# Patient Record
Sex: Male | Born: 1950 | Race: Black or African American | Hispanic: No | Marital: Married | Smoking: Former smoker
Health system: Southern US, Community
[De-identification: ages and names within clinical notes are randomized; demographics above are authoritative.]

## PROBLEM LIST (undated history)

## (undated) DIAGNOSIS — F1011 Alcohol abuse, in remission: Secondary | ICD-10-CM

## (undated) DIAGNOSIS — K219 Gastro-esophageal reflux disease without esophagitis: Secondary | ICD-10-CM

## (undated) DIAGNOSIS — E119 Type 2 diabetes mellitus without complications: Secondary | ICD-10-CM

## (undated) DIAGNOSIS — F1010 Alcohol abuse, uncomplicated: Secondary | ICD-10-CM

## (undated) DIAGNOSIS — E7800 Pure hypercholesterolemia, unspecified: Secondary | ICD-10-CM

## (undated) DIAGNOSIS — I10 Essential (primary) hypertension: Secondary | ICD-10-CM

## (undated) DIAGNOSIS — M549 Dorsalgia, unspecified: Secondary | ICD-10-CM

## (undated) DIAGNOSIS — B182 Chronic viral hepatitis C: Secondary | ICD-10-CM

## (undated) HISTORY — DX: Alcohol abuse, uncomplicated: F10.10

## (undated) HISTORY — DX: Chronic viral hepatitis C: B18.2

## (undated) HISTORY — DX: Gastro-esophageal reflux disease without esophagitis: K21.9

## (undated) HISTORY — DX: Essential (primary) hypertension: I10

## (undated) HISTORY — PX: KNEE SURGERY: SHX244

## (undated) HISTORY — PX: FOOT SURGERY: SHX648

## (undated) HISTORY — PX: BACK SURGERY: SHX140

## (undated) HISTORY — DX: Pure hypercholesterolemia, unspecified: E78.00

## (undated) HISTORY — DX: Alcohol abuse, in remission: F10.11

---

## 1997-09-26 ENCOUNTER — Other Ambulatory Visit: Admission: RE | Admit: 1997-09-26 | Discharge: 1997-09-26 | Payer: Self-pay

## 1997-10-01 ENCOUNTER — Inpatient Hospital Stay (HOSPITAL_COMMUNITY): Admission: EM | Admit: 1997-10-01 | Discharge: 1997-10-02 | Payer: Self-pay | Admitting: Emergency Medicine

## 1997-11-09 ENCOUNTER — Other Ambulatory Visit: Admission: RE | Admit: 1997-11-09 | Discharge: 1997-11-09 | Payer: Self-pay | Admitting: Family Medicine

## 1997-11-22 ENCOUNTER — Other Ambulatory Visit: Admission: RE | Admit: 1997-11-22 | Discharge: 1997-11-22 | Payer: Self-pay | Admitting: Family Medicine

## 1999-05-10 ENCOUNTER — Emergency Department (HOSPITAL_COMMUNITY): Admission: EM | Admit: 1999-05-10 | Discharge: 1999-05-10 | Payer: Self-pay | Admitting: Emergency Medicine

## 1999-10-04 ENCOUNTER — Emergency Department (HOSPITAL_COMMUNITY): Admission: EM | Admit: 1999-10-04 | Discharge: 1999-10-04 | Payer: Self-pay | Admitting: Emergency Medicine

## 1999-12-22 ENCOUNTER — Emergency Department (HOSPITAL_COMMUNITY): Admission: EM | Admit: 1999-12-22 | Discharge: 1999-12-22 | Payer: Self-pay | Admitting: Emergency Medicine

## 2000-12-07 ENCOUNTER — Encounter: Payer: Self-pay | Admitting: Emergency Medicine

## 2000-12-07 ENCOUNTER — Emergency Department (HOSPITAL_COMMUNITY): Admission: EM | Admit: 2000-12-07 | Discharge: 2000-12-07 | Payer: Self-pay | Admitting: Emergency Medicine

## 2001-01-07 ENCOUNTER — Emergency Department (HOSPITAL_COMMUNITY): Admission: EM | Admit: 2001-01-07 | Discharge: 2001-01-07 | Payer: Self-pay | Admitting: Emergency Medicine

## 2001-02-26 ENCOUNTER — Emergency Department (HOSPITAL_COMMUNITY): Admission: EM | Admit: 2001-02-26 | Discharge: 2001-02-26 | Payer: Self-pay | Admitting: *Deleted

## 2001-07-04 ENCOUNTER — Emergency Department (HOSPITAL_COMMUNITY): Admission: EM | Admit: 2001-07-04 | Discharge: 2001-07-04 | Payer: Self-pay | Admitting: Emergency Medicine

## 2001-07-18 ENCOUNTER — Emergency Department (HOSPITAL_COMMUNITY): Admission: EM | Admit: 2001-07-18 | Discharge: 2001-07-18 | Payer: Self-pay

## 2001-07-25 ENCOUNTER — Emergency Department (HOSPITAL_COMMUNITY): Admission: EM | Admit: 2001-07-25 | Discharge: 2001-07-25 | Payer: Self-pay | Admitting: Emergency Medicine

## 2002-03-26 ENCOUNTER — Emergency Department (HOSPITAL_COMMUNITY): Admission: EM | Admit: 2002-03-26 | Discharge: 2002-03-26 | Payer: Self-pay | Admitting: Emergency Medicine

## 2003-06-04 ENCOUNTER — Emergency Department (HOSPITAL_COMMUNITY): Admission: EM | Admit: 2003-06-04 | Discharge: 2003-06-04 | Payer: Self-pay | Admitting: Emergency Medicine

## 2003-07-24 ENCOUNTER — Ambulatory Visit (HOSPITAL_COMMUNITY): Admission: RE | Admit: 2003-07-24 | Discharge: 2003-07-24 | Payer: Self-pay | Admitting: Family Medicine

## 2004-02-14 ENCOUNTER — Emergency Department (HOSPITAL_COMMUNITY): Admission: EM | Admit: 2004-02-14 | Discharge: 2004-02-14 | Payer: Self-pay | Admitting: Emergency Medicine

## 2004-03-25 ENCOUNTER — Ambulatory Visit: Payer: Self-pay | Admitting: Family Medicine

## 2004-06-19 ENCOUNTER — Emergency Department (HOSPITAL_COMMUNITY): Admission: EM | Admit: 2004-06-19 | Discharge: 2004-06-19 | Payer: Self-pay | Admitting: Family Medicine

## 2004-09-09 ENCOUNTER — Ambulatory Visit: Payer: Self-pay | Admitting: Family Medicine

## 2004-09-10 ENCOUNTER — Encounter (INDEPENDENT_AMBULATORY_CARE_PROVIDER_SITE_OTHER): Payer: Self-pay | Admitting: Family Medicine

## 2004-09-10 LAB — CONVERTED CEMR LAB: PSA: 0.18 ng/mL

## 2004-09-15 ENCOUNTER — Ambulatory Visit: Payer: Self-pay | Admitting: Family Medicine

## 2004-09-19 ENCOUNTER — Emergency Department (HOSPITAL_COMMUNITY): Admission: EM | Admit: 2004-09-19 | Discharge: 2004-09-20 | Payer: Self-pay | Admitting: Emergency Medicine

## 2005-03-02 ENCOUNTER — Ambulatory Visit: Payer: Self-pay | Admitting: Family Medicine

## 2005-05-14 ENCOUNTER — Ambulatory Visit: Payer: Self-pay | Admitting: Family Medicine

## 2005-05-15 ENCOUNTER — Ambulatory Visit: Payer: Self-pay | Admitting: Family Medicine

## 2005-05-21 ENCOUNTER — Emergency Department (HOSPITAL_COMMUNITY): Admission: EM | Admit: 2005-05-21 | Discharge: 2005-05-21 | Payer: Self-pay | Admitting: Emergency Medicine

## 2005-06-20 ENCOUNTER — Emergency Department (HOSPITAL_COMMUNITY): Admission: EM | Admit: 2005-06-20 | Discharge: 2005-06-20 | Payer: Self-pay | Admitting: Emergency Medicine

## 2005-07-10 ENCOUNTER — Ambulatory Visit: Payer: Self-pay | Admitting: Family Medicine

## 2005-08-25 ENCOUNTER — Emergency Department (HOSPITAL_COMMUNITY): Admission: EM | Admit: 2005-08-25 | Discharge: 2005-08-25 | Payer: Self-pay | Admitting: Family Medicine

## 2006-02-06 ENCOUNTER — Emergency Department (HOSPITAL_COMMUNITY): Admission: EM | Admit: 2006-02-06 | Discharge: 2006-02-06 | Payer: Self-pay | Admitting: Family Medicine

## 2006-02-09 ENCOUNTER — Ambulatory Visit: Payer: Self-pay | Admitting: Family Medicine

## 2006-03-03 ENCOUNTER — Ambulatory Visit: Payer: Self-pay | Admitting: Internal Medicine

## 2006-03-15 ENCOUNTER — Emergency Department (HOSPITAL_COMMUNITY): Admission: EM | Admit: 2006-03-15 | Discharge: 2006-03-15 | Payer: Self-pay | Admitting: Family Medicine

## 2006-04-12 ENCOUNTER — Ambulatory Visit: Payer: Self-pay | Admitting: Family Medicine

## 2006-06-18 ENCOUNTER — Ambulatory Visit: Payer: Self-pay | Admitting: Family Medicine

## 2006-08-18 ENCOUNTER — Emergency Department (HOSPITAL_COMMUNITY): Admission: EM | Admit: 2006-08-18 | Discharge: 2006-08-18 | Payer: Self-pay | Admitting: Family Medicine

## 2006-09-20 ENCOUNTER — Ambulatory Visit: Payer: Self-pay | Admitting: Family Medicine

## 2006-10-04 ENCOUNTER — Emergency Department (HOSPITAL_COMMUNITY): Admission: EM | Admit: 2006-10-04 | Discharge: 2006-10-04 | Payer: Self-pay | Admitting: Family Medicine

## 2006-12-21 ENCOUNTER — Encounter (INDEPENDENT_AMBULATORY_CARE_PROVIDER_SITE_OTHER): Payer: Self-pay | Admitting: Family Medicine

## 2006-12-21 DIAGNOSIS — I10 Essential (primary) hypertension: Secondary | ICD-10-CM | POA: Insufficient documentation

## 2006-12-21 DIAGNOSIS — K219 Gastro-esophageal reflux disease without esophagitis: Secondary | ICD-10-CM | POA: Insufficient documentation

## 2006-12-21 HISTORY — DX: Essential (primary) hypertension: I10

## 2006-12-21 HISTORY — DX: Gastro-esophageal reflux disease without esophagitis: K21.9

## 2006-12-24 DIAGNOSIS — F1011 Alcohol abuse, in remission: Secondary | ICD-10-CM | POA: Insufficient documentation

## 2006-12-24 DIAGNOSIS — B182 Chronic viral hepatitis C: Secondary | ICD-10-CM | POA: Insufficient documentation

## 2006-12-24 HISTORY — DX: Chronic viral hepatitis C: B18.2

## 2007-01-24 ENCOUNTER — Ambulatory Visit: Payer: Self-pay | Admitting: Family Medicine

## 2007-02-02 ENCOUNTER — Ambulatory Visit (HOSPITAL_COMMUNITY): Admission: RE | Admit: 2007-02-02 | Discharge: 2007-02-02 | Payer: Self-pay | Admitting: Family Medicine

## 2007-02-28 ENCOUNTER — Ambulatory Visit: Payer: Self-pay | Admitting: Family Medicine

## 2007-06-05 ENCOUNTER — Emergency Department (HOSPITAL_COMMUNITY): Admission: EM | Admit: 2007-06-05 | Discharge: 2007-06-05 | Payer: Self-pay | Admitting: Emergency Medicine

## 2007-06-23 ENCOUNTER — Emergency Department (HOSPITAL_COMMUNITY): Admission: EM | Admit: 2007-06-23 | Discharge: 2007-06-23 | Payer: Self-pay | Admitting: Family Medicine

## 2007-08-02 ENCOUNTER — Emergency Department (HOSPITAL_COMMUNITY): Admission: EM | Admit: 2007-08-02 | Discharge: 2007-08-02 | Payer: Self-pay | Admitting: Family Medicine

## 2007-11-24 ENCOUNTER — Emergency Department (HOSPITAL_COMMUNITY): Admission: EM | Admit: 2007-11-24 | Discharge: 2007-11-24 | Payer: Self-pay | Admitting: Family Medicine

## 2008-01-19 ENCOUNTER — Ambulatory Visit: Payer: Self-pay | Admitting: Family Medicine

## 2008-01-19 LAB — CONVERTED CEMR LAB
ALT: 85 units/L — ABNORMAL HIGH (ref 0–53)
AST: 59 units/L — ABNORMAL HIGH (ref 0–37)
Basophils Absolute: 0 10*3/uL (ref 0.0–0.1)
Basophils Relative: 0 % (ref 0–1)
Calcium: 9.9 mg/dL (ref 8.4–10.5)
Chloride: 99 meq/L (ref 96–112)
Creatinine, Ser: 1.2 mg/dL (ref 0.40–1.50)
Hemoglobin: 17.2 g/dL — ABNORMAL HIGH (ref 13.0–17.0)
MCHC: 34.6 g/dL (ref 30.0–36.0)
Monocytes Absolute: 0.6 10*3/uL (ref 0.1–1.0)
Neutro Abs: 4.4 10*3/uL (ref 1.7–7.7)
Neutrophils Relative %: 57 % (ref 43–77)
PSA: 0.18 ng/mL (ref 0.10–4.00)
Platelets: 213 10*3/uL (ref 150–400)
RDW: 12.7 % (ref 11.5–15.5)
Total Bilirubin: 1.7 mg/dL — ABNORMAL HIGH (ref 0.3–1.2)
Total CHOL/HDL Ratio: 3.5
VLDL: 28 mg/dL (ref 0–40)

## 2008-01-20 ENCOUNTER — Emergency Department (HOSPITAL_COMMUNITY): Admission: EM | Admit: 2008-01-20 | Discharge: 2008-01-20 | Payer: Self-pay | Admitting: Family Medicine

## 2008-05-19 ENCOUNTER — Emergency Department (HOSPITAL_COMMUNITY): Admission: EM | Admit: 2008-05-19 | Discharge: 2008-05-19 | Payer: Self-pay | Admitting: Family Medicine

## 2008-06-19 ENCOUNTER — Ambulatory Visit: Payer: Self-pay | Admitting: Family Medicine

## 2008-06-19 LAB — CONVERTED CEMR LAB
Calcium: 9.7 mg/dL (ref 8.4–10.5)
Sodium: 142 meq/L (ref 135–145)

## 2008-07-10 ENCOUNTER — Emergency Department (HOSPITAL_COMMUNITY): Admission: EM | Admit: 2008-07-10 | Discharge: 2008-07-10 | Payer: Self-pay | Admitting: Family Medicine

## 2008-07-16 ENCOUNTER — Emergency Department (HOSPITAL_COMMUNITY): Admission: EM | Admit: 2008-07-16 | Discharge: 2008-07-16 | Payer: Self-pay | Admitting: Emergency Medicine

## 2008-08-24 ENCOUNTER — Ambulatory Visit: Payer: Self-pay | Admitting: Family Medicine

## 2009-07-11 ENCOUNTER — Ambulatory Visit: Payer: Self-pay | Admitting: Family Medicine

## 2009-07-11 LAB — CONVERTED CEMR LAB
ALT: 127 units/L — ABNORMAL HIGH (ref 0–53)
CO2: 32 meq/L (ref 19–32)
Calcium: 9.5 mg/dL (ref 8.4–10.5)
Chloride: 100 meq/L (ref 96–112)
PSA: 0.2 ng/mL (ref 0.10–4.00)
Potassium: 3.9 meq/L (ref 3.5–5.3)
Sodium: 141 meq/L (ref 135–145)
Total Protein: 7.4 g/dL (ref 6.0–8.3)

## 2009-09-19 ENCOUNTER — Ambulatory Visit: Payer: Self-pay | Admitting: Gastroenterology

## 2010-06-08 ENCOUNTER — Encounter: Payer: Self-pay | Admitting: Gastroenterology

## 2011-02-12 LAB — POCT I-STAT, CHEM 8
BUN: 16
Calcium, Ion: 1.14
Creatinine, Ser: 1.2
TCO2: 29

## 2012-09-27 ENCOUNTER — Ambulatory Visit (INDEPENDENT_AMBULATORY_CARE_PROVIDER_SITE_OTHER): Payer: BC Managed Care – PPO | Admitting: General Surgery

## 2012-10-13 ENCOUNTER — Encounter (INDEPENDENT_AMBULATORY_CARE_PROVIDER_SITE_OTHER): Payer: Self-pay | Admitting: General Surgery

## 2012-11-08 ENCOUNTER — Encounter (INDEPENDENT_AMBULATORY_CARE_PROVIDER_SITE_OTHER): Payer: Self-pay | Admitting: General Surgery

## 2012-11-08 ENCOUNTER — Ambulatory Visit (INDEPENDENT_AMBULATORY_CARE_PROVIDER_SITE_OTHER): Payer: BC Managed Care – PPO | Admitting: General Surgery

## 2012-11-08 VITALS — BP 150/100 | HR 60 | Temp 97.0°F | Resp 14 | Ht 73.0 in | Wt 205.0 lb

## 2012-11-08 DIAGNOSIS — K623 Rectal prolapse: Secondary | ICD-10-CM

## 2012-11-08 DIAGNOSIS — K644 Residual hemorrhoidal skin tags: Secondary | ICD-10-CM

## 2012-11-08 MED ORDER — HYDROCORTISONE 2.5 % RE CREA: TOPICAL_CREAM | Freq: Two times a day (BID) | RECTAL | Status: DC

## 2012-11-08 NOTE — Progress Notes (Signed)
Chief Complaint  Patient presents with  . New Evaluation    eval rectal prolapse    HISTORY: Gary Maddox is a 62 y.o. male who presents to the office with rectal prolapse.  Other symptoms include prolapse that occurs with BM's and then regresses after about an hr.  This had been occurring for several yrs.  He has tried hemorrhoid creams and suppositories in the past with no success.  Nothing makes the symptoms worse.   It is intermittent in nature and occurs when he strains.  His bowel habits are regular and his bowel movements are sometimes hard.  His fiber intake is dietary.  He has never had a colonoscopy.  He does have prolapsing tissue.     Past Medical History  Diagnosis Date  . Hypertension     Hep C  History reviewed. No pertinent past surgical history.      Current Outpatient Prescriptions  Medication Sig Dispense Refill  . metoprolol (LOPRESSOR) 50 MG tablet        No current facility-administered medications for this visit.      Not on File    Family History  Problem Relation Age of Onset  . Hypertension Mother   . Hypertension Father     History   Social History  . Marital Status: Married    Spouse Name: N/A    Number of Children: N/A  . Years of Education: N/A   Social History Main Topics  . Smoking status: Never Smoker   . Smokeless tobacco: Never Used  . Alcohol Use: No  . Drug Use: No  . Sexually Active: None   Other Topics Concern  . None   Social History Narrative  . None      REVIEW OF SYSTEMS - PERTINENT POSITIVES ONLY: Review of Systems - General ROS: negative for - chills, fever or weight loss Hematological and Lymphatic ROS: negative for - bleeding problems, blood clots or bruising Respiratory ROS: no cough, shortness of breath, or wheezing Cardiovascular ROS: no chest pain or dyspnea on exertion Gastrointestinal ROS: no abdominal pain, change in bowel habits, or black or bloody stools Genito-Urinary ROS: no dysuria, trouble  voiding, or hematuria  EXAM: Filed Vitals:   11/08/12 1031  BP: 150/100  Pulse: 60  Temp: 97 F (36.1 C)  Resp: 14    General appearance: alert and cooperative Resp: clear to auscultation bilaterally Cardio: regular rate and rhythm GI: soft, non-tender; bowel sounds normal; no masses,  no organomegaly   Procedure: Anoscopy Surgeon: Maisie Fus Diagnosis: rectal bleeding  Assistant: Neysa Bonito After the risks and benefits were explained, verbal consent was obtained for above procedure  Anesthesia: none Findings: partial rectal prolapse posteriorly.  Large external hemorrhoids    ASSESSMENT AND PLAN: Gary Maddox is a 62 y.o. 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  I have asked him to start a fiber supplement and bowel regimen.  I have prescribed anusol to help with his external hemorrhoids.  We discussed the next step in evaluation, which would be a colonoscopy.  Once this is completed, we will see how his symptoms are on the bowel regimen.  I suspect that if he did not strain as much, that this wouldn't be as much of a problem.    Vanita Panda, MD Colon and Rectal Surgery / General Surgery [facility]      Visit Diagnoses: No diagnosis found.  Primary Care Physician: Bradd Burner, PA-C

## 2012-11-08 NOTE — Patient Instructions (Addendum)
HEMORRHOIDS    Did you know... Hemorrhoids are one of the most common ailments known.  More than half the population will develop hemorrhoids, usually after age 60.  Millions of Americans currently suffer from hemorrhoids.  The average person suffers in silence for a long period before seeking medical care.  Today's treatment methods make some types of hemorrhoid removal much less painful.  What are hemorrhoids? Often described as "varicose veins of the anus and rectum", hemorrhoids are enlarged, bulging blood vessels in and about the anus and lower rectum. There are two types of hemorrhoids: external and internal, which refer to their location.  External (outside) hemorrhoids develop near the anus and are covered by very sensitive skin. These are usually painless. However, if a blood clot (thrombosis) develops in an external hemorrhoid, it becomes a painful, hard lump. The external hemorrhoid may bleed if it ruptures. Internal (inside) hemorrhoids develop within the anus beneath the lining. Painless bleeding and protrusion during bowel movements are the most common symptom. However, an internal hemorrhoid can cause severe pain if it is completely "prolapsed" - protrudes from the anal opening and cannot be pushed back inside.   What causes hemorrhoids? An exact cause is unknown; however, the upright posture of humans alone forces a great deal of pressure on the rectal veins, which sometimes causes them to bulge. Other contributing factors include:  . Aging  . Chronic constipation or diarrhea  . Pregnancy  . Heredity  . Straining during bowel movements  . Faulty bowel function due to overuse of laxatives or enemas . Spending long periods of time (e.g., reading) on the toilet  Whatever the cause, the tissues supporting the vessels stretch. As a result, the vessels dilate; their walls become thin and bleed. If the stretching and pressure continue, the weakened vessels protrude.  What are the  symptoms? If you notice any of the following, you could have hemorrhoids:  . Bleeding during bowel movements  . Protrusion during bowel movements . Itching in the anal area  . Pain  . Sensitive lump(s)  How are hemorrhoids treated? Mild symptoms can be relieved frequently by increasing the amount of fiber (e.g., fruits, vegetables, breads and cereals) and fluids in the diet. Eliminating excessive straining reduces the pressure on hemorrhoids and helps prevent them from protruding. A sitz bath - sitting in plain warm water for about 10 minutes - can also provide some relief . With these measures, the pain and swelling of most symptomatic hemorrhoids will decrease in two to seven days, and the firm lump should recede within four to six weeks. In cases of severe or persistent pain from a thrombosed hemorrhoid, your physician may elect to remove the hemorrhoid containing the clot with a small incision. Performed under local anesthesia as an outpatient, this procedure generally provides relief. Severe hemorrhoids may require special treatment, much of which can be performed on an outpatient basis.  . Ligation - the rubber band treatment - works effectively on internal hemorrhoids that protrude with bowel movements. A small rubber band is placed over the hemorrhoid, cutting off its blood supply. The hemorrhoid and the band fall off in a few days and the wound usually heals in a week or two. This procedure sometimes produces mild discomfort and bleeding and may need to be repeated for a full effect.  There is a more intense version of this procedure that is done in the OR as outpatient surgery called THD.  It involves identifying blood vessels leading to the   hemorrhoids and then tying them off with sutures.  This method is a little more painful than rubber band ligation but less painful than traditional hemorrhoidectomy and usually does not have to be repeated.  It is best for internal hemorrhoids that  bleed.  Rubber Band Ligation of Internal Hemorrhoids:  A.  Bulging, bleeding, internal hemorrhoid B.  Rubber band applied at the base of the hemorrhoid C.  About 7 days later, the banded hemorrhoid has fallen off leaving a small scar (arrow)  . Injection and Coagulation can also be used on bleeding hemorrhoids that do not protrude. Both methods are relatively painless and cause the hemorrhoid to shrivel up. Marland Kitchen Hemorrhoidectomy - surgery to remove the hemorrhoids - is the most complete method for removal of internal and external hemorrhoids. It is necessary when (1) clots repeatedly form in external hemorrhoids; (2) ligation fails to treat internal hemorrhoids; (3) the protruding hemorrhoid cannot be reduced; or (4) there is persistent bleeding. A hemorrhoidectomy removes excessive tissue that causes the bleeding and protrusion. It is done under anesthesia using sutures, and may, depending upon circumstances, require hospitalization and a period of inactivity. Laser hemorrhoidectomies do not offer any advantage over standard operative techniques. They are also quite expensive, and contrary to popular belief, are no less painful.  Do hemorrhoids lead to cancer? No. There is no relationship between hemorrhoids and cancer. However, the symptoms of hemorrhoids, particularly bleeding, are similar to those of colorectal cancer and other diseases of the digestive system. Therefore, it is important that all symptoms are investigated by a physician specially trained in treating diseases of the colon and rectum and that everyone 50 years or older undergo screening tests for colorectal cancer. Do not rely on over-the-counter medications or other self-treatments. See a colorectal surgeon first so your symptoms can be properly evaluated and effective treatment prescribed.  2012 American Society of Colon & Rectal Surgeons    Rectal Prolapse  What is rectal prolapse? Rectal prolapse is a condition in which the  rectum (the lower end of the colon, located just above the anus) becomes stretched out and protrudes out of the anus. Weakness of the anal sphincter muscle is often associated with rectal prolapse at this stage, resulting in leakage of stool or mucus. While the condition occurs in both sexes, it is much more common in women than men. Why does it occur? Several factors may contribute to the development of rectal prolapse. It may come from a lifelong habit of straining to have bowel movements or as a late consequence of the childbirth process. Rarely, there may be a genetic predisposition. It seems to be a part of the aging process in many patients who experience stretching of the ligaments that support the rectum inside the pelvis as well as weakening of the anal sphincter muscle. Sometimes rectal prolapse results from generalized pelvic floor dysfunction, in association with urinary incontinence and pelvic organ prolapse as well. Neurological problems, such as spinal cord transection or spinal cord disease, can also lead to prolapse. In most cases, however, no single cause is identified. Is rectal prolapse the same as hemorrhoids? Some of the symptoms may be the same: bleeding and/or tissue that protrudes from the rectum. Rectal prolapse, however, involves a segment of the bowel located higher up within the body, while hemorrhoids develop near the anal opening. How is rectal prolapse diagnosed? A physician can often diagnose this condition with a careful history and a complete anorectal examination. To demonstrate the prolapse, patients may be  asked to sit on a commode and "strain" as if having a bowel movement. Occasionally, a rectal prolapse may be "hidden" or internal, making the diagnosis more difficult. In this situation, an x-ray examination called a videodefecogram may be helpful. This examination, which takes x-ray pictures while the patient is having a bowel movement, can also assist the physician in  determining whether surgery may be beneficial and which operation may be appropriate. Anorectal manometry may also be used to evaluate the function of the muscles around the rectum as they relate to having a bowel movement. How is rectal prolapse treated? Although constipation and straining may contribute to the development of rectal prolapse, simply correcting these problems may not improve the prolapse once it has developed. There are many different ways to surgically correct rectal prolapse. Abdominal or rectal surgery may be suggested. An abdominal repair may be approached laparoscopically in selected patients. The decision to recommend an abdominal or rectal surgery takes into account many factors, including age, physical condition, extent of prolapse and the results of various tests. How successful is treatment? A great majority of patients are completely relieved of symptoms, or are significantly helped, by the appropriate procedure. Success depends on many factors, including the status of a patient's anal sphincter muscle before surgery, whether the prolapse is internal or external, the overall condition of the patient. If the anal sphincter muscles have been weakened, either because of the rectal prolapse or for some other reason, they have the potential to regain strength after the rectal prolapse has been corrected. It may take up to a year to determine the ultimate impact of the surgery on bowel function. Chronic constipation and straining after surgical correction should be avoided.  What is a colon and rectal surgeon? Colon and rectal surgeons are experts in the surgical and non-surgical treatment of diseases of the colon, rectum and anus. They have completed advanced surgical training in the treatment of these diseases as well as full general surgical training. Board-certified colon and rectal surgeons complete residencies in general surgery and colon and rectal surgery, and pass intensive  examinations conducted by the American Board of Surgery and the American Board of Colon and Rectal Surgery. They are well-versed in the treatment of both benign and malignant diseases of the colon, rectum and anus and are able to perform routine screening examinations and surgically treat conditions if indicated to do so.   2012 American Society of Colon & Rectal Surgeons    GETTING TO GOOD BOWEL HEALTH. Irregular bowel habits such as constipation can lead to many problems over time.  Having one soft bowel movement a day is the most important way to prevent further problems.  The anorectal canal is designed to handle stretching and feces to safely manage our ability to get rid of solid waste (feces, poop, stool) out of our body.  BUT, hard constipated stools can act like ripping concrete bricks causing inflamed hemorrhoids, anal fissures, abdominal pain and bloating.     The goal: ONE SOFT BOWEL MOVEMENT A DAY!  To have soft, regular bowel movements:    Drink at least 8 tall glasses of water a day.     Take plenty of fiber.  Fiber is the undigested part of plant food that passes into the colon, acting s "natures broom" to encourage bowel motility and movement.  Fiber can absorb and hold large amounts of water. This results in a larger, bulkier stool, which is soft and easier to pass. Work gradually over several  weeks up to 6 servings a day of fiber (25g a day even more if needed) in the form of: o Vegetables -- Root (potatoes, carrots, turnips), leafy green (lettuce, salad greens, celery, spinach), or cooked high residue (cabbage, broccoli, etc) o Fruit -- Fresh (unpeeled skin & pulp), Dried (prunes, apricots, cherries, etc ),  or stewed ( applesauce)  o Whole grain breads, pasta, etc (whole wheat)  o Bran cereals    Bulking Agents -- This type of water-retaining fiber generally is easily obtained each day by one of the following:  o Psyllium bran -- The psyllium plant is remarkable because its  ground seeds can retain so much water. This product is available as Metamucil, Konsyl, Effersyllium, Per Diem Fiber, or the less expensive generic preparation in drug and health food stores. Although labeled a laxative, it really is not a laxative.  o Methylcellulose -- This is another fiber derived from wood which also retains water. It is available as Citrucel. o Polyethylene Glycol - and "artificial" fiber commonly called Miralax or Glycolax.  It is helpful for people with gassy or bloated feelings with regular fiber o Flax Seed - a less gassy fiber than psyllium   No reading or other relaxing activity while on the toilet. If bowel movements take longer than 5 minutes, you are too constipated   AVOID CONSTIPATION.  High fiber and water intake usually takes care of this.  Sometimes a laxative is needed to stimulate more frequent bowel movements, but    Laxatives are not a good long-term solution as it can wear the colon out. o Osmotics (Milk of Magnesia, Fleets phosphosoda, Magnesium citrate, MiraLax, GoLytely) are safer than  o Stimulants (Senokot, Castor Oil, Dulcolax, Ex Lax)    o Do not take laxatives for more than 7days in a row.    IF SEVERELY CONSTIPATED, try a Bowel Retraining Program: o Do not use laxatives.  o Eat a diet high in roughage, such as bran cereals and leafy vegetables.  o Drink six (6) ounces of prune or apricot juice each morning.  o Eat two (2) large servings of stewed fruit each day.  o Take one (1) heaping tablespoon of a psyllium-based bulking agent twice a day. Use sugar-free sweetener when possible to avoid excessive calories.  o Eat a normal breakfast.  o Set aside 15 minutes after breakfast to sit on the toilet, but do not strain to have a bowel movement.  o If you do not have a bowel movement by the third day, use an enema and repeat the above steps.     [facility]  ONE-DAY (1) PRE-OP HOME COLON PREP INSTRUCTIONS: ** MIRALAX / GATORADE PREP  **  You must follow the instructions below carefully.  If you have questions or problems, please call and speak to someone in the clinic department at our office:   [phone].     INSTRUCTIONS: 1. Five days prior to your procedure do not eat nuts, popcorn, or fruit with seeds.  Stop all fiber supplements such as Metamucil, Citrucel, etc. 2. Two days before surgery fill the prescription at a pharmacy of your choice and purchase the additional supplies below.         MIRALAX - GATORADE -- DULCOLAX TABS:   Purchase a bottle of MIRALAX  (255 gm bottle)    In addition, purchase four (4) DULCOLAX TABLETS (no prescription required- ask the pharmacist if you can't find them)    Purchase one 64 oz GATORADE.  (  Do NOT purchase red Gatorade; any other flavor is acceptable) and place in refrigerator to get cold.  3.   Day Before Surgery:   6 am: take the 4 Dulcolax tablets   You may only have clear liquids (tea, coffee, juice, broth, jello, soft drinks, gummy bears).  You cannot have solid foods, cream, milk or milk products.  Drink at lease 8 ounces of liquids every hour while awake.   Mix the entire bottle of MiraLax and the Gatorade in a large container.    10:00am: Begin drinking the Gatorade mixture until gone (8 oz every 15-30 minutes).      You may suck on a lime wedge or hard candy to "freshen your palate" in between glasses   If you are a diabetic, take your blood sugar reading several time throughout the prep.  Have some juice available to take if your sugar level gets too low   You may feel chilled while taking the prep.  Have some warm tea or broth to help warm up.   Continue clear liquids until midnight or bedtime  3. The day of your procedure:   Do not eat or drink ANYTHING after midnight before your surgery.     If you take Heart or Blood Pressure medicine, ask the pre-op nurses about these during your preop appointment.   Further pre-operative instructions will be given to you from the  hospital.   Expect to be contacted 5-7 days before your surgery.

## 2012-12-19 ENCOUNTER — Encounter (INDEPENDENT_AMBULATORY_CARE_PROVIDER_SITE_OTHER): Payer: Self-pay | Admitting: General Surgery

## 2013-02-07 ENCOUNTER — Ambulatory Visit
Admission: RE | Admit: 2013-02-07 | Discharge: 2013-02-07 | Disposition: A | Discharge status: Home / Self Care | Payer: BC Managed Care – PPO | Source: Ambulatory Visit | Attending: Family Medicine | Admitting: Family Medicine

## 2013-02-07 ENCOUNTER — Other Ambulatory Visit: Payer: Self-pay | Admitting: Family Medicine

## 2013-02-07 DIAGNOSIS — M542 Cervicalgia: Secondary | ICD-10-CM

## 2015-04-08 ENCOUNTER — Ambulatory Visit: Payer: Self-pay | Admitting: Podiatry

## 2015-05-28 ENCOUNTER — Emergency Department (HOSPITAL_COMMUNITY)
Admission: EM | Admit: 2015-05-28 | Discharge: 2015-05-28 | Disposition: A | Discharge status: Home / Self Care | Payer: Worker's Compensation | Attending: Emergency Medicine | Admitting: Emergency Medicine

## 2015-05-28 ENCOUNTER — Encounter (HOSPITAL_COMMUNITY): Payer: Self-pay

## 2015-05-28 ENCOUNTER — Emergency Department (HOSPITAL_COMMUNITY): Payer: Worker's Compensation

## 2015-05-28 DIAGNOSIS — I10 Essential (primary) hypertension: Secondary | ICD-10-CM | POA: Diagnosis not present

## 2015-05-28 DIAGNOSIS — Y9289 Other specified places as the place of occurrence of the external cause: Secondary | ICD-10-CM | POA: Insufficient documentation

## 2015-05-28 DIAGNOSIS — M25561 Pain in right knee: Secondary | ICD-10-CM

## 2015-05-28 DIAGNOSIS — S8991XA Unspecified injury of right lower leg, initial encounter: Secondary | ICD-10-CM | POA: Insufficient documentation

## 2015-05-28 DIAGNOSIS — Z79899 Other long term (current) drug therapy: Secondary | ICD-10-CM | POA: Diagnosis not present

## 2015-05-28 DIAGNOSIS — W000XXA Fall on same level due to ice and snow, initial encounter: Secondary | ICD-10-CM | POA: Insufficient documentation

## 2015-05-28 DIAGNOSIS — Y998 Other external cause status: Secondary | ICD-10-CM | POA: Diagnosis not present

## 2015-05-28 DIAGNOSIS — Z7952 Long term (current) use of systemic steroids: Secondary | ICD-10-CM | POA: Diagnosis not present

## 2015-05-28 DIAGNOSIS — Y9389 Activity, other specified: Secondary | ICD-10-CM | POA: Diagnosis not present

## 2015-05-28 DIAGNOSIS — W19XXXA Unspecified fall, initial encounter: Secondary | ICD-10-CM

## 2015-05-28 MED ORDER — NAPROXEN 500 MG PO TABS: 500.0000 mg | ORAL_TABLET | Freq: Two times a day (BID) | ORAL | Status: DC

## 2015-05-28 MED ORDER — ACETAMINOPHEN 325 MG PO TABS
650.0000 mg | ORAL_TABLET | Freq: Once | ORAL | Status: AC
  Administered 2015-05-28: 650 mg via ORAL
  Filled 2015-05-28: qty 2

## 2015-05-28 MED ORDER — OXYCODONE-ACETAMINOPHEN 5-325 MG PO TABS: 2.0000 | ORAL_TABLET | ORAL | Status: DC | PRN

## 2015-05-28 NOTE — ED Notes (Signed)
PT WANTED TO WALK OUT WITH CRUTCHES.
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
## 2015-05-28 NOTE — ED Notes (Signed)
See PA's assessment. 

## 2015-05-28 NOTE — ED Notes (Signed)
Per Pt, Pt was at work on Sunday. Pt slipped on some ice and fell onto his back. Pt denies back pain, but reports right knee pain. Swelling noted and increased pain with movement.

## 2015-05-28 NOTE — Discharge Instructions (Signed)
Knee Pain Follow up with your primary care provider. Take naproxen for pain and Percocet for breakthrough pain.  Knee pain is a common problem. It can have many causes. The pain often goes away by following your doctor's home care instructions. Treatment for ongoing pain will depend on the cause of your pain. If your knee pain continues, more tests may be needed to diagnose your condition. Tests may include X-rays or other imaging studies of your knee. HOME CARE  Take medicines only as told by your doctor.  Rest your knee and keep it raised (elevated) while you are resting.  Do not do things that cause pain or make your pain worse.  Avoid activities where both feet leave the ground at the same time, such as running, jumping rope, or doing jumping jacks.  Apply ice to the knee area:  Put ice in a plastic bag.  Place a towel between your skin and the bag.  Leave the ice on for 20 minutes, 2-3 times a day.  Ask your doctor if you should wear an elastic knee support.  Sleep with a pillow under your knee.  Lose weight if you are overweight. Being overweight can make your knee hurt more.  Do not use any tobacco products, including cigarettes, chewing tobacco, or electronic cigarettes. If you need help quitting, ask your doctor. Smoking may slow the healing of any bone and joint problems that you may have. GET HELP IF:  Your knee pain does not stop, it changes, or it gets worse.  You have a fever along with knee pain.  Your knee gives out or locks up.  Your knee becomes more swollen. GET HELP RIGHT AWAY IF:   Your knee feels hot to the touch.  You have chest pain or trouble breathing.   This information is not intended to replace advice given to you by your health care provider. Make sure you discuss any questions you have with your health care provider.   Document Released: 07/31/2008 Document Revised: 05/25/2014 Document Reviewed: 07/05/2013 Elsevier Interactive Patient  Education [employer].

## 2016-07-21 ENCOUNTER — Ambulatory Visit: Payer: Self-pay

## 2016-07-21 ENCOUNTER — Other Ambulatory Visit: Payer: Self-pay | Admitting: Occupational Medicine

## 2016-07-21 DIAGNOSIS — G8929 Other chronic pain: Secondary | ICD-10-CM

## 2016-07-21 DIAGNOSIS — M545 Low back pain: Principal | ICD-10-CM

## 2016-08-03 ENCOUNTER — Encounter (HOSPITAL_COMMUNITY): Payer: Self-pay | Admitting: Emergency Medicine

## 2016-08-03 ENCOUNTER — Ambulatory Visit (HOSPITAL_COMMUNITY)
Admission: EM | Admit: 2016-08-03 | Discharge: 2016-08-03 | Disposition: A | Discharge status: Home / Self Care | Payer: BC Managed Care – PPO | Attending: Family Medicine | Admitting: Family Medicine

## 2016-08-03 DIAGNOSIS — G894 Chronic pain syndrome: Secondary | ICD-10-CM

## 2016-08-03 DIAGNOSIS — M79604 Pain in right leg: Secondary | ICD-10-CM | POA: Diagnosis not present

## 2016-08-03 MED ORDER — TRAMADOL HCL 50 MG PO TABS: 50.0000 mg | ORAL_TABLET | Freq: Four times a day (QID) | ORAL | 0 refills | Status: DC | PRN

## 2016-08-03 NOTE — Discharge Instructions (Signed)
Follow-up with your primary care doctor for your leg pain and medication. If you are not able to get into the orthopedist call your primary care doctor to make another referral. We will not be able to provide chronic pain medication for you. According to your record you are prescribed tramadol 50 mg #60 on 06/22/2016.

## 2016-08-03 NOTE — ED Provider Notes (Signed)
CSN: [id number]     Arrival date & time 08/03/16  1337 History   First MD Initiated Contact with Patient 08/03/16 1503     Chief Complaint  Patient presents with  . Leg Pain   (Consider location/radiation/quality/duration/timing/severity/associated sxs/prior Treatment) 66 year old male complaining of chronic right leg pain starting in the right low back, the right knee and right ankle for approximately a year and a half. Apparently he fell and received unspecified injuries but has had chronic pain. He has a PCP in which he saw 06/22/2016 and a prescription for tramadol 50 mg #60 was sent in to [facility]. The patient states he did not know that he did not take that medicine up. He wants to know what I can do to get rid of the pain and make his leg better.      Past Medical History:  Diagnosis Date  . Hypertension    History reviewed. No pertinent surgical history. Family History  Problem Relation Age of Onset  . Hypertension Mother   . Hypertension Father    Social History  Substance Use Topics  . Smoking status: Never Smoker  . Smokeless tobacco: Never Used  . Alcohol use No    Review of Systems  Constitutional: Positive for activity change.  Respiratory: Negative.   Gastrointestinal: Negative.   Genitourinary: Negative.   Musculoskeletal:       As per HPI  Skin: Negative.   Neurological: Negative for dizziness, weakness, numbness and headaches.    Allergies  Patient has no known allergies.  Home Medications   Prior to Admission medications   Medication Sig Start Date End Date Taking? Authorizing Provider  metoprolol (LOPRESSOR) 50 MG tablet  08/06/12  Yes Historical Provider, MD  traMADol (ULTRAM) 50 MG tablet Take 1 tablet (50 mg total) by mouth every 6 (six) hours as needed. 08/03/16   Janne Napoleon, NP   Meds Ordered and Administered this Visit  Medications - No data to display  BP (!) 167/124 (BP Location: Left Arm) Comment: taken twice  Pulse (!) 114   Temp  98.7 F (37.1 C) (Oral)   Resp 18   SpO2 99%  No data found.   Physical Exam  Constitutional: He is oriented to person, place, and time. He appears well-developed and well-nourished.  HENT:  Head: Normocephalic and atraumatic.  Eyes: EOM are normal. Left eye exhibits no discharge.  Neck: Normal range of motion. Neck supple.  Cardiovascular: Normal rate.   Musculoskeletal:  Although the patient is complaining of swelling in his knee and lower leg comparison to the left demonstrates no observable swelling or other difference. Tenderness to light palpation to the entire knee, ankle and right posterior hip. No deformities. He is ambulatory flexion and extension of the knee is intact. No discolorations. Distal neurovascular motor sensory intact.  Neurological: He is alert and oriented to person, place, and time. No cranial nerve deficit.  Skin: Skin is warm and dry.  Psychiatric: He has a normal mood and affect.  Nursing note and vitals reviewed.   Urgent Care Course     Procedures (including critical care time)  Labs Review Labs Reviewed - No data to display  Imaging Review No results found.   Visual Acuity Review  Right Eye Distance:   Left Eye Distance:   Bilateral Distance:    Right Eye Near:   Left Eye Near:    Bilateral Near:         MDM   1. Right leg pain  2. Chronic pain syndrome    Follow-up with your primary care doctor for your leg pain and medication. If you are not able to get into the orthopedist call your primary care doctor to make another referral. We will not be able to provide chronic pain medication for you. According to your record you are prescribed tramadol 50 mg #60 on 06/22/2016. Meds ordered this encounter  Medications  . traMADol (ULTRAM) 50 MG tablet    Sig: Take 1 tablet (50 mg total) by mouth every 6 (six) hours as needed.    Dispense:  15 tablet    Refill:  0    Order Specific Question:   Supervising Provider    Answer:    Robyn Haber [[id number]]   To be voided if Rx for same is awaiting him at [place], NP 08/03/16 1531

## 2016-08-03 NOTE — ED Triage Notes (Signed)
The patient presented to the [facility] with a complaint of right leg pain , upper and lower, secondary to a fall that occurred 1.5 years ago.

## 2016-08-11 ENCOUNTER — Encounter (HOSPITAL_COMMUNITY): Payer: Self-pay | Admitting: Emergency Medicine

## 2016-08-11 ENCOUNTER — Emergency Department (HOSPITAL_COMMUNITY)
Admission: EM | Admit: 2016-08-11 | Discharge: 2016-08-12 | Disposition: A | Discharge status: Home / Self Care | Payer: BC Managed Care – PPO | Attending: Emergency Medicine | Admitting: Emergency Medicine

## 2016-08-11 DIAGNOSIS — Y990 Civilian activity done for income or pay: Secondary | ICD-10-CM | POA: Diagnosis not present

## 2016-08-11 DIAGNOSIS — M545 Low back pain: Secondary | ICD-10-CM | POA: Insufficient documentation

## 2016-08-11 DIAGNOSIS — Z79899 Other long term (current) drug therapy: Secondary | ICD-10-CM | POA: Insufficient documentation

## 2016-08-11 DIAGNOSIS — Y9289 Other specified places as the place of occurrence of the external cause: Secondary | ICD-10-CM | POA: Diagnosis not present

## 2016-08-11 DIAGNOSIS — W009XXA Unspecified fall due to ice and snow, initial encounter: Secondary | ICD-10-CM | POA: Diagnosis not present

## 2016-08-11 DIAGNOSIS — G8929 Other chronic pain: Secondary | ICD-10-CM | POA: Insufficient documentation

## 2016-08-11 DIAGNOSIS — Y9389 Activity, other specified: Secondary | ICD-10-CM | POA: Diagnosis not present

## 2016-08-11 DIAGNOSIS — I10 Essential (primary) hypertension: Secondary | ICD-10-CM | POA: Diagnosis not present

## 2016-08-11 MED ORDER — OXYCODONE-ACETAMINOPHEN 5-325 MG PO TABS
1.0000 | ORAL_TABLET | Freq: Once | ORAL | Status: AC
Start: 2016-08-11 — End: 2016-08-11
  Administered 2016-08-11: 1 via ORAL

## 2016-08-11 MED ORDER — OXYCODONE-ACETAMINOPHEN 5-325 MG PO TABS
ORAL_TABLET | ORAL | Status: AC
  Filled 2016-08-11: qty 1

## 2016-08-11 NOTE — ED Triage Notes (Signed)
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 Waiting for [facility] orthopedic. Patient aggravated initial injuries a few weeks ago.

## 2016-08-11 NOTE — ED Notes (Addendum)
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  Works at [employer], on [address].

## 2016-08-12 MED ORDER — NAPROXEN 250 MG PO TABS
500.0000 mg | ORAL_TABLET | Freq: Once | ORAL | Status: AC
  Administered 2016-08-12: 500 mg via ORAL
  Filled 2016-08-12: qty 2

## 2016-08-12 MED ORDER — METHOCARBAMOL 500 MG PO TABS: 500.0000 mg | ORAL_TABLET | Freq: Two times a day (BID) | ORAL | 0 refills | Status: DC

## 2016-08-12 MED ORDER — NAPROXEN 500 MG PO TABS: 500.0000 mg | ORAL_TABLET | Freq: Two times a day (BID) | ORAL | 0 refills | Status: DC

## 2016-08-12 MED ORDER — METHOCARBAMOL 500 MG PO TABS
500.0000 mg | ORAL_TABLET | Freq: Once | ORAL | Status: AC
  Administered 2016-08-12: 500 mg via ORAL
  Filled 2016-08-12: qty 1

## 2016-08-12 NOTE — ED Provider Notes (Signed)
[facility] DEPT Provider Note   CSN: [id number] Arrival date & time: 08/11/16  2106   By signing my name below, I, Delton Prairie, attest that this documentation has been prepared under the direction and in the presence of  Etta Quill, NP. Electronically Signed: Delton Prairie, ED Scribe. 08/12/16. 12:14 AM.   History   Chief Complaint Chief Complaint  Patient presents with  . Fall    injured on the job    HPI Comments:  Gary Maddox is a 66 y.o. male, with a PMHx of HTN, who presents to the Emergency Department complaining of worsened, lower back pain x 1 month. He describes his pain as a spasm and aching sensation. He states his pain radiates down to his right knee. Pt notes he initially sustained his injury on 07/05/2015 after a fall. He has been taking percocet with relief. Pt denies bowel/bladder incontinence, saddle anesthesia and any other associated symptoms.   PCP: Baruch Goldmann, PA-C  The history is provided by the patient. No language interpreter was used.  Back Pain   This is a chronic problem. The current episode started more than 1 week ago. The problem occurs constantly. The problem has not changed since onset.The pain is associated with falling. The pain is moderate. The symptoms are aggravated by certain positions. Pertinent negatives include no fever, no numbness, no bowel incontinence and no bladder incontinence.    Past Medical History:  Diagnosis Date  . Hypertension     Patient Active Problem List   Diagnosis Date Noted  . 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 HYPERTENSION 12/21/2006  . GERD 12/21/2006    History reviewed. No pertinent surgical history.     Home Medications    Prior to Admission medications   Medication Sig Start Date End Date Taking? Authorizing Provider  metoprolol (LOPRESSOR) 50 MG tablet  08/06/12   Historical Provider, MD  traMADol (ULTRAM) 50 MG tablet Take  1 tablet (50 mg total) by mouth every 6 (six) hours as needed. 08/03/16   Janne Napoleon, NP    Family History Family History  Problem Relation Age of Onset  . Hypertension Mother   . Hypertension Father     Social History Social History  Substance Use Topics  . Smoking status: Never Smoker  . Smokeless tobacco: Never Used  . Alcohol use No     Allergies   Patient has no known allergies.   Review of Systems Review of Systems  Constitutional: Negative for fever.  Gastrointestinal: Negative for bowel incontinence.  Genitourinary: Negative for bladder incontinence.  Musculoskeletal: Positive for back pain.  Neurological: Negative for numbness.  All other systems reviewed and are negative.    Physical Exam Updated Vital Signs BP (!) 118/92 (BP Location: Right Arm)   Pulse 65   Temp 97.8 F (36.6 C) (Oral)   Resp 18   Ht 6' (1.829 m)   Wt 182 lb 4 oz (82.7 kg)   SpO2 98%   BMI 24.72 kg/m   Physical Exam  Constitutional: He is oriented to person, place, and time. He appears well-developed and well-nourished.  HENT:  Head: Normocephalic and atraumatic.  Eyes: EOM are normal.  Neck: Normal range of motion.  Cardiovascular: Normal rate, regular rhythm, normal heart sounds and intact distal pulses.   Pulmonary/Chest: Effort normal and breath sounds normal. No respiratory distress.  Abdominal: Soft. He exhibits no distension. There is no tenderness.  Musculoskeletal: Normal range of  motion.  No strength deficits.   Neurological: He is alert and oriented to person, place, and time. He has normal strength. No cranial nerve deficit or sensory deficit. He displays a negative Romberg sign.  Skin: Skin is warm and dry.  Psychiatric: He has a normal mood and affect. Judgment normal.  Nursing note and vitals reviewed.    ED Treatments / Results  DIAGNOSTIC STUDIES:  Oxygen Saturation is 98% on RA, normal by my interpretation.    COORDINATION OF CARE:  12:13 AM  Discussed treatment plan with pt at bedside and pt agreed to plan.  Labs (all labs ordered are listed, but only abnormal results are displayed) Labs Reviewed - No data to display  EKG  EKG Interpretation None       Radiology No results found.  Procedures Procedures (including critical care time)  Medications Ordered in ED Medications  oxyCODONE-acetaminophen (PERCOCET/ROXICET) 5-325 MG per tablet (not administered)  oxyCODONE-acetaminophen (PERCOCET/ROXICET) 5-325 MG per tablet 1 tablet (1 tablet Oral Given 08/11/16 2206)     Initial Impression / Assessment and Plan / ED Course  I have reviewed the triage vital signs and the nursing notes.  Pertinent labs & imaging results that were available during my care of the patient were reviewed by me and considered in my medical decision making (see chart for details).     Patient with back pain.  No neurological deficits and normal neuro exam.  Patient is ambulatory.  No loss of bowel or bladder control.  No concern for cauda equina.  No fever, night sweats, weight loss, h/o cancer, IVDA, no recent procedure to back. No urinary symptoms suggestive of UTI.  Supportive care and return precaution discussed. Appears safe for discharge at this time. Follow up as indicated in discharge paperwork.   Final Clinical Impressions(s) / ED Diagnoses   Final diagnoses:  Chronic bilateral low back pain, with sciatica presence unspecified    New Prescriptions New Prescriptions   METHOCARBAMOL (ROBAXIN) 500 MG TABLET    Take 1 tablet (500 mg total) by mouth 2 (two) times daily.   NAPROXEN (NAPROSYN) 500 MG TABLET    Take 1 tablet (500 mg total) by mouth 2 (two) times daily.    I personally performed the services described in this documentation, which was scribed in my presence. The recorded information has been reviewed and is accurate.     Etta Quill, NP 08/12/16 [place], MD 08/12/16 [phone]

## 2016-09-15 ENCOUNTER — Ambulatory Visit (INDEPENDENT_AMBULATORY_CARE_PROVIDER_SITE_OTHER): Payer: Self-pay | Admitting: Physician Assistant

## 2016-09-22 ENCOUNTER — Encounter (HOSPITAL_COMMUNITY): Payer: Self-pay

## 2016-09-22 ENCOUNTER — Emergency Department (HOSPITAL_COMMUNITY): Payer: BC Managed Care – PPO

## 2016-09-22 ENCOUNTER — Emergency Department (HOSPITAL_COMMUNITY)
Admission: EM | Admit: 2016-09-22 | Discharge: 2016-09-22 | Disposition: A | Discharge status: Home / Self Care | Payer: BC Managed Care – PPO | Attending: Emergency Medicine | Admitting: Emergency Medicine

## 2016-09-22 DIAGNOSIS — R413 Other amnesia: Secondary | ICD-10-CM | POA: Diagnosis not present

## 2016-09-22 DIAGNOSIS — Z79899 Other long term (current) drug therapy: Secondary | ICD-10-CM | POA: Insufficient documentation

## 2016-09-22 DIAGNOSIS — I10 Essential (primary) hypertension: Secondary | ICD-10-CM | POA: Diagnosis not present

## 2016-09-22 DIAGNOSIS — R4182 Altered mental status, unspecified: Secondary | ICD-10-CM | POA: Diagnosis present

## 2016-09-22 DIAGNOSIS — Z5181 Encounter for therapeutic drug level monitoring: Secondary | ICD-10-CM | POA: Diagnosis not present

## 2016-09-22 LAB — CBC
HCT: 46.2 % (ref 39.0–52.0)
HEMOGLOBIN: 16.7 g/dL (ref 13.0–17.0)
MCH: 35.7 pg — AB (ref 26.0–34.0)
MCHC: 36.1 g/dL — AB (ref 30.0–36.0)
MCV: 98.7 fL (ref 78.0–100.0)
Platelets: 164 10*3/uL (ref 150–400)
RBC: 4.68 MIL/uL (ref 4.22–5.81)
RDW: 12.9 % (ref 11.5–15.5)
WBC: 8.8 10*3/uL (ref 4.0–10.5)

## 2016-09-22 LAB — COMPREHENSIVE METABOLIC PANEL
ALBUMIN: 3.5 g/dL (ref 3.5–5.0)
ALK PHOS: 76 U/L (ref 38–126)
ALT: 367 U/L — ABNORMAL HIGH (ref 17–63)
ANION GAP: 8 (ref 5–15)
AST: 259 U/L — ABNORMAL HIGH (ref 15–41)
BILIRUBIN TOTAL: 1.7 mg/dL — AB (ref 0.3–1.2)
BUN: 15 mg/dL (ref 6–20)
CO2: 30 mmol/L (ref 22–32)
Calcium: 8.8 mg/dL — ABNORMAL LOW (ref 8.9–10.3)
Chloride: 98 mmol/L — ABNORMAL LOW (ref 101–111)
Creatinine, Ser: 1.18 mg/dL (ref 0.61–1.24)
GFR calc Af Amer: >60 mL/min (ref >60)
GLUCOSE: 148 mg/dL — AB (ref 65–99)
Potassium: 3.3 mmol/L — ABNORMAL LOW (ref 3.5–5.1)
Sodium: 136 mmol/L (ref 135–145)
TOTAL PROTEIN: 7.3 g/dL (ref 6.5–8.1)

## 2016-09-22 LAB — I-STAT CHEM 8, ED
BUN: 18 mg/dL (ref 6–20)
CREATININE: 1.1 mg/dL (ref 0.61–1.24)
Calcium, Ion: 1.09 mmol/L — ABNORMAL LOW (ref 1.15–1.40)
Chloride: 96 mmol/L — ABNORMAL LOW (ref 101–111)
Glucose, Bld: 144 mg/dL — ABNORMAL HIGH (ref 65–99)
HEMATOCRIT: 48 % (ref 39.0–52.0)
HEMOGLOBIN: 16.3 g/dL (ref 13.0–17.0)
POTASSIUM: 3.3 mmol/L — AB (ref 3.5–5.1)
SODIUM: 139 mmol/L (ref 135–145)
TCO2: 35 mmol/L (ref 0–100)

## 2016-09-22 LAB — PROTIME-INR
INR: 1.04
Prothrombin Time: 13.6 seconds (ref 11.4–15.2)

## 2016-09-22 LAB — DIFFERENTIAL
Basophils Absolute: 0 10*3/uL (ref 0.0–0.1)
Basophils Relative: 0 %
EOS ABS: 0 10*3/uL (ref 0.0–0.7)
EOS PCT: 1 %
LYMPHS ABS: 2.4 10*3/uL (ref 0.7–4.0)
LYMPHS PCT: 27 %
MONOS PCT: 10 %
Monocytes Absolute: 0.9 10*3/uL (ref 0.1–1.0)
NEUTROS PCT: 62 %
Neutro Abs: 5.5 10*3/uL (ref 1.7–7.7)

## 2016-09-22 LAB — I-STAT TROPONIN, ED: TROPONIN I, POC: 0 ng/mL (ref 0.00–0.08)

## 2016-09-22 LAB — APTT: aPTT: 27 seconds (ref 24–36)

## 2016-09-22 MED ORDER — POTASSIUM CHLORIDE ER 10 MEQ PO TBCR: 20.0000 meq | EXTENDED_RELEASE_TABLET | Freq: Every day | ORAL | 0 refills | Status: DC

## 2016-09-22 MED ORDER — HYDROCHLOROTHIAZIDE 25 MG PO TABS
25.0000 mg | ORAL_TABLET | Freq: Once | ORAL | Status: AC
  Administered 2016-09-22: 25 mg via ORAL
  Filled 2016-09-22: qty 1

## 2016-09-22 MED ORDER — HYDROCHLOROTHIAZIDE 25 MG PO TABS: 25.0000 mg | ORAL_TABLET | Freq: Every day | ORAL | 0 refills | Status: DC

## 2016-09-22 MED ORDER — POTASSIUM CHLORIDE CRYS ER 20 MEQ PO TBCR
40.0000 meq | EXTENDED_RELEASE_TABLET | Freq: Once | ORAL | Status: AC
  Administered 2016-09-22: 40 meq via ORAL
  Filled 2016-09-22: qty 2

## 2016-09-22 NOTE — ED Notes (Signed)
MRI called and state pt unable to tolerate MRI and doesn't wish to try again with medication on board.
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
## 2016-09-22 NOTE — ED Provider Notes (Signed)
[facility] DEPT Provider Note   CSN: [id number] Arrival date & time: 09/22/16  0900     History   Chief Complaint Chief Complaint  Patient presents with  . Altered Mental Status    HPI Gary Maddox is a 66 y.o. male.  HPI   Woke up this morning and had PT scheduled at [facility], couldn't remember how to get there. Last week could not remember how to get to [facility] office.  Was taking tramadol last week and thought that contributed to the forgetfulness and stopped.  Taking methocarmabol and naproxen as needed. Took lopressor this morning, didn't take any other medications this morning or last night.    Today, he then was asking where PT was then he was asking why he was going to physical therapy, and wife had to explain accident he had.  February and March began to have worsening back pain since accident and is having outpt treatment and PT for this.  Now feeling back to himself.  Did not have other neuro symptoms, no drooping of face, no weakness/numbness/visual changes/trouble speaking.  No other acute symptoms or medication changes.   Past Medical History:  Diagnosis Date  . Hypertension     Patient Active Problem List   Diagnosis Date Noted  . 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 HYPERTENSION 12/21/2006  . GERD 12/21/2006    History reviewed. No pertinent surgical history.     Home Medications    Prior to Admission medications   Medication Sig Start Date End Date Taking? Authorizing Provider  carvedilol (COREG) 12.5 MG tablet Take 12.5 mg by mouth every morning. 06/22/16  Yes [provider]  methocarbamol (ROBAXIN) 500 MG tablet Take 1 tablet (500 mg total) by mouth 2 (two) times daily. Patient taking differently: Take 500 mg by mouth every 8 (eight) hours as needed for muscle spasms.  08/12/16  Yes Etta Quill, NP  oxymetazoline (AFRIN) 0.05 % nasal spray Place 1 spray  into both nostrils 2 (two) times daily as needed for congestion.   Yes [provider]  predniSONE (STERAPRED UNI-PAK 21 TAB) 10 MG (21) TBPK tablet Take 1 tablet by mouth taper from 4 doses each day to 1 dose and stop.  09/17/16  Yes [provider]  sildenafil (REVATIO) 20 MG tablet Take 2-5 tablets by mouth daily as needed for erectile dysfunction. 08/17/16  Yes [provider]  sodium chloride (OCEAN) 0.65 % SOLN nasal spray Place 1 spray into both nostrils as needed for congestion.   Yes [provider]  Tetrahydrozoline HCl (VISINE OP) Place 1 drop into both eyes daily as needed (dry eyes).   Yes [provider]  hydrochlorothiazide (HYDRODIURIL) 25 MG tablet Take 1 tablet (25 mg total) by mouth daily. 09/22/16   Gareth Morgan, MD  naproxen (NAPROSYN) 500 MG tablet Take 1 tablet (500 mg total) by mouth 2 (two) times daily. Patient not taking: Reported on 09/22/2016 08/12/16   Etta Quill, NP  potassium chloride (K-DUR) 10 MEQ tablet Take 2 tablets (20 mEq total) by mouth daily. 09/22/16 09/25/16  Gareth Morgan, MD  traMADol (ULTRAM) 50 MG tablet Take 1 tablet (50 mg total) by mouth every 6 (six) hours as needed. Patient not taking: Reported on 09/22/2016 08/03/16   Janne Napoleon, NP    Family History Family History  Problem Relation Age of Onset  . Hypertension Mother   . Hypertension Father  Social History Social History  Substance Use Topics  . Smoking status: Never Smoker  . Smokeless tobacco: Never Used  . Alcohol use No     Allergies   Patient has no known allergies.   Review of Systems Review of Systems  Constitutional: Negative for fever.  HENT: Negative for sore throat.   Eyes: Negative for visual disturbance.  Respiratory: Negative for shortness of breath.   Cardiovascular: Negative for chest pain.  Gastrointestinal: Negative for abdominal pain.  Genitourinary: Negative for difficulty urinating.  Musculoskeletal: Positive  for back pain. Negative for neck stiffness.  Skin: Negative for rash.  Neurological: Negative for dizziness, syncope, facial asymmetry, speech difficulty, weakness, numbness and headaches.  Psychiatric/Behavioral: Positive for confusion.     Physical Exam Updated Vital Signs BP (!) 171/107   Pulse 64   Temp 98.1 F (36.7 C) (Oral)   Resp 16   Ht 6\' 1"  (1.854 m)   Wt 181 lb 4 oz (82.2 kg)   SpO2 95%   BMI 23.91 kg/m   Physical Exam  Constitutional: He is oriented to person, place, and time. He appears well-developed and well-nourished. No distress.  HENT:  Head: Normocephalic and atraumatic.  Eyes: Conjunctivae and EOM are normal.  Neck: Normal range of motion.  Cardiovascular: Normal rate, regular rhythm, normal heart sounds and intact distal pulses.  Exam reveals no gallop and no friction rub.   No murmur heard. Pulmonary/Chest: Effort normal and breath sounds normal. No respiratory distress. He has no wheezes. He has no rales.  Abdominal: Soft. He exhibits no distension. There is no tenderness. There is no guarding.  Musculoskeletal: He exhibits no edema.  Neurological: He is alert and oriented to person, place, and time. He has normal strength. No cranial nerve deficit or sensory deficit. Coordination and gait normal. GCS eye subscore is 4. GCS verbal subscore is 5. GCS motor subscore is 6.  Skin: Skin is warm and dry. He is not diaphoretic.  Nursing note and vitals reviewed.    ED Treatments / Results  Labs (all labs ordered are listed, but only abnormal results are displayed) Labs Reviewed  CBC - Abnormal; Notable for the following:       Result Value   MCH 35.7 (*)    MCHC 36.1 (*)    All other components within normal limits  COMPREHENSIVE METABOLIC PANEL - Abnormal; Notable for the following:    Potassium 3.3 (*)    Chloride 98 (*)    Glucose, Bld 148 (*)    Calcium 8.8 (*)    AST 259 (*)    ALT 367 (*)    Total Bilirubin 1.7 (*)    All other components  within normal limits  I-STAT CHEM 8, ED - Abnormal; Notable for the following:    Potassium 3.3 (*)    Chloride 96 (*)    Glucose, Bld 144 (*)    Calcium, Ion 1.09 (*)    All other components within normal limits  PROTIME-INR  APTT  DIFFERENTIAL  I-STAT TROPOININ, ED    EKG  EKG Interpretation None       Radiology Ct Head Wo Contrast  Result Date: 09/22/2016 CLINICAL DATA:  Resolved confusion EXAM: CT HEAD WITHOUT CONTRAST TECHNIQUE: Contiguous axial images were obtained from the base of the skull through the vertex without intravenous contrast. COMPARISON:  11/24/2007 FINDINGS: Brain: Left lateral frontal gliotic appearance beneath a small calvarial defect, stable since at least 2009. There is low-density in the cerebral white matter  that is usually from chronic small vessel ischemia in this patient with history of hypertension. Brain volume is normal. Vascular: Atherosclerotic calcification.  No hyperdense vessel Skull: Small postoperative appearing defect along the left calvarium. Sinuses/Orbits: Right maxillary sinusitis with sclerotic wall thickening and opacification. IMPRESSION: 1. No acute finding. 2. Gliosis beneath a left calvarial defect, correlate with surgical history. This finding is stable since at least 2009. 3. Moderate cerebral white matter disease that has progressed from 2009, likely chronic small vessel ischemia in this patient with history of hypertension. 4. Chronic right maxillary sinusitis with near complete opacification. Electronically Signed   By: Monte Fantasia M.D.   On: 09/22/2016 10:40    Procedures Procedures (including critical care time)  Medications Ordered in ED Medications  hydrochlorothiazide (HYDRODIURIL) tablet 25 mg (25 mg Oral Given 09/22/16 1441)  potassium chloride SA (K-DUR,KLOR-CON) CR tablet 40 mEq (40 mEq Oral Given 09/22/16 1441)     Initial Impression / Assessment and Plan / ED Course  I have reviewed the triage vital signs and the  nursing notes.  Pertinent labs & imaging results that were available during my care of the patient were reviewed by me and considered in my medical decision making (see chart for details).     66 year old male with a history of chronic viral hepatitis C, alcohol abuse in remission, hypertension presents with concern for episode of confusion this morning, when he did not remember how to get to his orthopedic physical therapy office, nor did he really remember the reason he was going for physical therapy. The symptoms have resolved prior to arrival to the emergency department, and included no other neurologic symptoms. His neurologic exam is benign. His CT head showed no acute findings. Overall, giving symptoms, have low suspicion that this represents CVA, TIA, transient global amnesia or seizure as patient remembers the incident. Discussed with urology on-call who did recommend MRI for further evaluation. Ordered MRI, however patient went to MRI he thought he is unable to do it secondary to claustrophobia, and declined other anxiolytics.  Given patient continuing to be asymptomatic in the emergency department, low suspicion for TIA, feel that outpatient MRI is reasonable in setting of patient unable to complete MRI this time.  Low suspicion for other etiologies of confusion, including low suspicion for infection, hepatic encephalopathy. He doesn't have an increased transaminases from prior, however given symptoms have completely resolved have low suspicion for hepatic encephalopathy at this time. Patient with blood pressures increasing just prior to attempted discharge from the emergency department. He continues to be asymptomatic and have low suspicion for hypertensive emergency. Given elevated numbers, he is given Hydrocorothiazide and prescription for potassium. Recommend follow-up with primary care physician for blood pressure, elevated transaminases, as well as memory issues. Also recommend Neurology  follow up, 2 episodes of forgetting directions may also be consistent with very early dementia but feel more outpt work up indicated.   Final Clinical Impressions(s) / ED Diagnoses   Final diagnoses:  Memory deficit, episode of    New Prescriptions Discharge Medication List as of 09/22/2016  2:14 PM       Gareth Morgan, MD 09/22/16 1750

## 2016-09-22 NOTE — ED Notes (Signed)
Patient transported to CT 

## 2016-09-25 ENCOUNTER — Telehealth: Payer: Self-pay | Admitting: Family Medicine

## 2016-09-25 NOTE — Telephone Encounter (Signed)
Pt came in to be seen and had really high blood pressure. Pt did not want to stay until first available appt. I went and showed Santiago Glad what the pt had recorded his blood pressure to be at 9 that morning and Santiago Glad said he needed to go to the ER. Pt said he did not want to go to ER and would come in tomorrow for appt. Pt was still advised to go to ER before he left.

## 2016-09-25 NOTE — Telephone Encounter (Signed)
Noted  

## 2016-09-25 NOTE — Telephone Encounter (Signed)
Pressures were 170s/110s

## 2016-09-26 ENCOUNTER — Ambulatory Visit (INDEPENDENT_AMBULATORY_CARE_PROVIDER_SITE_OTHER): Payer: BC Managed Care – PPO | Admitting: Physician Assistant

## 2016-09-26 ENCOUNTER — Encounter: Payer: Self-pay | Admitting: Physician Assistant

## 2016-09-26 VITALS — BP 167/106 | HR 64 | Temp 97.6°F | Resp 18 | Ht 70.0 in | Wt 179.2 lb

## 2016-09-26 DIAGNOSIS — I10 Essential (primary) hypertension: Secondary | ICD-10-CM

## 2016-09-26 DIAGNOSIS — Z13220 Encounter for screening for lipoid disorders: Secondary | ICD-10-CM

## 2016-09-26 MED ORDER — CLONIDINE HCL 0.1 MG PO TABS: 0.1000 mg | ORAL_TABLET | Freq: Two times a day (BID) | ORAL | 1 refills | Status: DC

## 2016-09-26 NOTE — Progress Notes (Signed)
Gary Maddox  MRN: [id number] DOB: 02-Oct-1950  PCP: Mancel Bale, PA-C  Chief Complaint  Patient presents with  . Hypertension    P{t states that his BP has never really been under control.     Subjective:  Pt presents to clinic for medication.  He has been on these medications for a long time but he has never had control of his BP.  He feels fine.  He walks in the am and plays golf though lately he has been doing less due to a back injury.  He does not check his Bp at home - he checks it at the pharmacy - like today's reading is what he gets.  He has had recent lab work but unsure what it is.  Pt plans to change his care.  Review of Systems  Respiratory: Negative for cough and shortness of breath.   Cardiovascular: Negative for chest pain, palpitations and leg swelling.    Patient Active Problem List   Diagnosis Date Noted  . 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 HYPERTENSION 12/21/2006  . GERD 12/21/2006    Current Outpatient Prescriptions on File Prior to Visit  Medication Sig Dispense Refill  . carvedilol (COREG) 12.5 MG tablet Take 12.5 mg by mouth 2 (two) times daily with a meal.     . methocarbamol (ROBAXIN) 500 MG tablet Take 1 tablet (500 mg total) by mouth 2 (two) times daily. (Patient taking differently: Take 500 mg by mouth every 8 (eight) hours as needed for muscle spasms. ) 20 tablet 0  . naproxen (NAPROSYN) 500 MG tablet Take 1 tablet (500 mg total) by mouth 2 (two) times daily. 20 tablet 0  . oxymetazoline (AFRIN) 0.05 % nasal spray Place 1 spray into both nostrils 2 (two) times daily as needed for congestion.    . sildenafil (REVATIO) 20 MG tablet Take 2-5 tablets by mouth daily as needed for erectile dysfunction.  5  . Tetrahydrozoline HCl (VISINE OP) Place 1 drop into both eyes daily as needed (dry eyes).     No current facility-administered medications on file prior to visit.     No  Known Allergies  Pt patients past, family and social history were reviewed and updated.   Objective:  BP (!) 167/106   Pulse 64   Temp 97.6 F (36.4 C) (Oral)   Resp 18   Ht _0  (1.778 m)   Wt 179 lb 4 oz (81.3 kg)   SpO2 97%   BMI 25.72 kg/m   Physical Exam  Constitutional: He is oriented to person, place, and time and well-developed, well-nourished, and in no distress.  HENT:  Head: Normocephalic and atraumatic.  Right Ear: External ear normal.  Left Ear: External ear normal.  Eyes: Conjunctivae are normal.  Neck: Normal range of motion.  Cardiovascular: Normal rate, regular rhythm, normal heart sounds and intact distal pulses.   Pulmonary/Chest: Effort normal and breath sounds normal. He has no wheezes.  Musculoskeletal:       Right lower leg: He exhibits no edema.       Left lower leg: He exhibits no edema.  Neurological: He is alert and oriented to person, place, and time. Gait normal.  Skin: Skin is warm and dry.  Psychiatric: Mood, memory, affect and judgment normal.   EKG interpretation: Rate 58, NSR with fnegative T waves in Lead V4-6 which is similar to EKG 5/8 from ED.  Read with  Dr Brigitte Pulse Assessment and Plan :  Essential hypertension - Plan: Olmesartan-Amlodipine-HCTZ (TRIBENZOR) 40-10-25 MG TABS, CMP14+EGFR, TSH, EKG 12-Lead, CBC with Differential/Platelet, Ambulatory referral to Nephrology, Ambulatory referral to Cardiology, cloNIDine (CATAPRES) 0.1 MG tablet  Screening cholesterol level - Plan: Lipid panel   Pt is on triple therapy without control of HTN - he has some abnl changes in his EKG which are not acute so he will be referred to cardiology as well as nephrology for help with BP control.  We did add Clonidine at today's visit with decreased his BP while waiting on those appts.  Please call results to wife per patient Arvin Collard - [phone]  D/w Dr Jonna Clark PA-C  [facility] at [facility] 09/26/2016 9:18 AM

## 2016-09-26 NOTE — Patient Instructions (Addendum)
  We have added a medication called clonidine to your BP medications - this will work quickly but the dose may need to be increased.  We have referred you to a kidney doctor and a heart doctor - you will hear about those appointments in the upcoming week.   IF you received an x-ray today, you will receive an invoice from [facility]. Please contact [facility] Radiology at [phone] with questions or concerns regarding your invoice.   IF you received labwork today, you will receive an invoice from [place]. Please contact [facility] at [phone] with questions or concerns regarding your invoice.   Our billing staff will not be able to assist you with questions regarding bills from these companies.  You will be contacted with the lab results as soon as they are available. The fastest way to get your results is to activate your My Chart account. Instructions are located on the last page of this paperwork. If you have not heard from [place] regarding the results in 2 weeks, please contact this office.

## 2016-09-27 LAB — CBC WITH DIFFERENTIAL/PLATELET
BASOS ABS: 0 10*3/uL (ref 0.0–0.2)
BASOS: 0 %
EOS (ABSOLUTE): 0.1 10*3/uL (ref 0.0–0.4)
Eos: 2 %
HEMOGLOBIN: 16 g/dL (ref 13.0–17.7)
Hematocrit: 47.2 % (ref 37.5–51.0)
IMMATURE GRANS (ABS): 0 10*3/uL (ref 0.0–0.1)
IMMATURE GRANULOCYTES: 0 %
LYMPHS: 31 %
Lymphocytes Absolute: 2 10*3/uL (ref 0.7–3.1)
MCH: 35.2 pg — AB (ref 26.6–33.0)
MCHC: 33.9 g/dL (ref 31.5–35.7)
MCV: 104 fL — AB (ref 79–97)
MONOCYTES: 12 %
Monocytes Absolute: 0.8 10*3/uL (ref 0.1–0.9)
NEUTROS ABS: 3.5 10*3/uL (ref 1.4–7.0)
NEUTROS PCT: 55 %
PLATELETS: 183 10*3/uL (ref 150–379)
RBC: 4.55 x10E6/uL (ref 4.14–5.80)
RDW: 14.1 % (ref 12.3–15.4)
WBC: 6.4 10*3/uL (ref 3.4–10.8)

## 2016-09-27 LAB — CMP14+EGFR
ALT: 264 IU/L — ABNORMAL HIGH (ref 0–44)
AST: 170 IU/L — AB (ref 0–40)
Albumin/Globulin Ratio: 1.1 — ABNORMAL LOW (ref 1.2–2.2)
Albumin: 3.6 g/dL (ref 3.6–4.8)
Alkaline Phosphatase: 85 IU/L (ref 39–117)
BILIRUBIN TOTAL: 1.4 mg/dL — AB (ref 0.0–1.2)
BUN/Creatinine Ratio: 13 (ref 10–24)
BUN: 17 mg/dL (ref 8–27)
CHLORIDE: 97 mmol/L (ref 96–106)
CO2: 29 mmol/L (ref 18–29)
Calcium: 8.9 mg/dL (ref 8.6–10.2)
Creatinine, Ser: 1.33 mg/dL — ABNORMAL HIGH (ref 0.76–1.27)
GFR calc Af Amer: 65 mL/min/{1.73_m2} (ref >59)
GFR calc non Af Amer: 56 mL/min/{1.73_m2} — ABNORMAL LOW (ref >59)
GLUCOSE: 167 mg/dL — AB (ref 65–99)
Globulin, Total: 3.3 g/dL (ref 1.5–4.5)
POTASSIUM: 3.3 mmol/L — AB (ref 3.5–5.2)
SODIUM: 140 mmol/L (ref 134–144)
Total Protein: 6.9 g/dL (ref 6.0–8.5)

## 2016-09-27 LAB — TSH: TSH: 0.813 u[IU]/mL (ref 0.450–4.500)

## 2016-09-27 LAB — LIPID PANEL
CHOLESTEROL TOTAL: 198 mg/dL (ref 100–199)
Chol/HDL Ratio: 4.1 ratio (ref 0.0–5.0)
HDL: 48 mg/dL (ref >39)
LDL Calculated: 125 mg/dL — ABNORMAL HIGH (ref 0–99)
TRIGLYCERIDES: 126 mg/dL (ref 0–149)
VLDL Cholesterol Cal: 25 mg/dL (ref 5–40)

## 2016-10-01 LAB — SPECIMEN STATUS REPORT

## 2016-10-01 LAB — HGB A1C W/O EAG: Hgb A1c MFr Bld: 6.5 % — ABNORMAL HIGH (ref 4.8–5.6)

## 2016-10-05 ENCOUNTER — Ambulatory Visit (INDEPENDENT_AMBULATORY_CARE_PROVIDER_SITE_OTHER): Payer: BC Managed Care – PPO | Admitting: Physician Assistant

## 2016-10-05 ENCOUNTER — Encounter: Payer: Self-pay | Admitting: Physician Assistant

## 2016-10-05 VITALS — BP 138/98 | HR 61 | Temp 97.6°F | Resp 18 | Ht 70.0 in | Wt 180.4 lb

## 2016-10-05 DIAGNOSIS — E119 Type 2 diabetes mellitus without complications: Secondary | ICD-10-CM | POA: Diagnosis not present

## 2016-10-05 DIAGNOSIS — I10 Essential (primary) hypertension: Secondary | ICD-10-CM | POA: Diagnosis not present

## 2016-10-05 DIAGNOSIS — E7800 Pure hypercholesterolemia, unspecified: Secondary | ICD-10-CM

## 2016-10-05 HISTORY — DX: Pure hypercholesterolemia, unspecified: E78.00

## 2016-10-05 MED ORDER — METFORMIN HCL ER 500 MG PO TB24: 500.0000 mg | ORAL_TABLET | Freq: Every day | ORAL | 0 refills | Status: DC

## 2016-10-05 MED ORDER — ROSUVASTATIN CALCIUM 20 MG PO TABS: 20.0000 mg | ORAL_TABLET | Freq: Every day | ORAL | 0 refills | Status: DC

## 2016-10-05 NOTE — Progress Notes (Signed)
Gary Maddox  MRN: [id number] DOB: 12-Feb-1951  PCP: Mancel Bale, PA-C  Chief Complaint  Patient presents with  . Follow-up    hypertension     Subjective:  Pt presents to clinic for recheck of his BP - he has started the clonidine and he is tolerating it ok - he has not been checking his BP at home.  He is in less pain with his WC injury recently.  Family history of HTN, DM and elevated cholesterol  Review of Systems  Constitutional: Negative for chills and fever.  Cardiovascular: Negative for chest pain and leg swelling.  Neurological: Negative for numbness.    Patient Active Problem List   Diagnosis Date Noted  . Type 2 diabetes mellitus without complication, without long-term current use of insulin ([place]) 10/05/2016  . Elevated LDL cholesterol level 10/05/2016  . 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 Essential hypertension 12/21/2006  . GERD 12/21/2006    Current Outpatient Prescriptions on File Prior to Visit  Medication Sig Dispense Refill  . carvedilol (COREG) 12.5 MG tablet Take 12.5 mg by mouth 2 (two) times daily with a meal.     . cloNIDine (CATAPRES) 0.1 MG tablet Take 1 tablet (0.1 mg total) by mouth 2 (two) times daily. 60 tablet 1  . methocarbamol (ROBAXIN) 500 MG tablet Take 1 tablet (500 mg total) by mouth 2 (two) times daily. (Patient taking differently: Take 500 mg by mouth every 8 (eight) hours as needed for muscle spasms. ) 20 tablet 0  . naproxen (NAPROSYN) 500 MG tablet Take 1 tablet (500 mg total) by mouth 2 (two) times daily. 20 tablet 0  . Olmesartan-Amlodipine-HCTZ (TRIBENZOR) 40-10-25 MG TABS Take 1 tablet by mouth daily.    Marland Kitchen oxymetazoline (AFRIN) 0.05 % nasal spray Place 1 spray into both nostrils 2 (two) times daily as needed for congestion.    . sildenafil (REVATIO) 20 MG tablet Take 2-5 tablets by mouth daily as needed for erectile dysfunction.  5  . Tetrahydrozoline HCl  (VISINE OP) Place 1 drop into both eyes daily as needed (dry eyes).     No current facility-administered medications on file prior to visit.     No Known Allergies  Pt patients past, family and social history were reviewed and updated.   Objective:  BP (!) 138/98   Pulse 61   Temp 97.6 F (36.4 C) (Oral)   Resp 18   Ht 5\' 10"  (1.778 m)   Wt 180 lb 6.4 oz (81.8 kg)   SpO2 98%   BMI 25.88 kg/m   Physical Exam  Constitutional: He is oriented to person, place, and time and well-developed, well-nourished, and in no distress.  HENT:  Head: Normocephalic and atraumatic.  Right Ear: External ear normal.  Left Ear: External ear normal.  Eyes: Conjunctivae are normal.  Neck: Normal range of motion.  Pulmonary/Chest: Effort normal.  Neurological: He is alert and oriented to person, place, and time. Gait normal.  Skin: Skin is warm and dry.  Psychiatric: Mood, memory, affect and judgment normal.    Assessment and Plan :  Type 2 diabetes mellitus without complication, without long-term current use of insulin ([facility]) - Plan: metFORMIN (GLUCOPHAGE XR) 500 MG 24 hr tablet - add metformin - d/w pt decrease simple sugars and carbs in his diet  Elevated LDL cholesterol level - Plan: rosuvastatin (CRESTOR) 20 MG tablet - start medication  Essential hypertension - better controlled -  call cardiologist back to schedule that appt.  Windell Hummingbird PA-C  [facility] at [facility] 10/05/2016 4:51 PM

## 2016-10-05 NOTE — Patient Instructions (Addendum)
For blood pressure Continue the tribenzor as you have been doing at 1 pill a day For the clonidine 0.1mg  - keep doing the 1 pill 2x/day  Call the cardiologist for that appt - you think they called you today   For diabetes Add metformin with food at night  For cholesterol  Add crestor at night   IF you received an x-ray today, you will receive an invoice from [facility] Radiology. Please contact [facility] Radiology at [phone] with questions or concerns regarding your invoice.   IF you received labwork today, you will receive an invoice from [facility]. Please contact [facility] at [phone] with questions or concerns regarding your invoice.   Our billing staff will not be able to assist you with questions regarding bills from these companies.  You will be contacted with the lab results as soon as they are available. The fastest way to get your results is to activate your My Chart account. Instructions are located on the last page of this paperwork. If you have not heard from [place] regarding the results in 2 weeks, please contact this office.

## 2016-10-06 ENCOUNTER — Telehealth: Payer: Self-pay | Admitting: Physician Assistant

## 2016-10-06 NOTE — Telephone Encounter (Signed)
Pt is needing to talk with sarah about getting a letter in regards to his blood pressure and him being able to do physical therapy   Best number [phone]

## 2016-10-07 NOTE — Telephone Encounter (Signed)
It is fine to write a note stating that he can participate in PT but I would like his BP checked prior to his sessions and if is over 180/100 I would like him to not do that session.

## 2016-10-07 NOTE — Telephone Encounter (Signed)
Pt needs a note states blood pressure is stable so he can to PT

## 2016-10-07 NOTE — Telephone Encounter (Signed)
Written and pt advised

## 2016-10-14 NOTE — Telephone Encounter (Signed)
THIS MESSAGE IS FROM NIKKI ROGERS (PHYSICAL THERAPIST) TO SARAH: SHE WOULD  LIKE SARAH TO KNOW THAT THE PATIENT'S BLOOD PRESSURE IS STILL ELEVATED. HIS DIASTOLIC IS OVER 785. HE CAN NOT HAVE PHYSICAL THERAPY BECAUSE OF THIS. HE HAS GONE 3 WEEKS NOW WITH OUT THERAPY. DOES HE NEED TO MAKE AN APPOINTMENT TO SEE SARAH IN THE OFFICE? IF HE DOES PLEASE CONTACT THE PATIENT TO ARRANGE. BEST PHONE [phone] (NIKKI ROGERS AT [place] TO PAGE HER) [place]

## 2016-10-16 ENCOUNTER — Ambulatory Visit (INDEPENDENT_AMBULATORY_CARE_PROVIDER_SITE_OTHER): Payer: BC Managed Care – PPO | Admitting: Physician Assistant

## 2016-10-16 ENCOUNTER — Encounter: Payer: Self-pay | Admitting: Physician Assistant

## 2016-10-16 VITALS — BP 130/92 | HR 55 | Temp 98.2°F | Resp 17 | Ht 71.0 in | Wt 183.0 lb

## 2016-10-16 DIAGNOSIS — I10 Essential (primary) hypertension: Secondary | ICD-10-CM

## 2016-10-16 NOTE — Patient Instructions (Addendum)
Please call the cardiologist office for an appt - they are waiting on you to make that call - [facility] -- [phone]  When you hear from the kidney doctor please make that appt    IF you received an x-ray today, you will receive an invoice from [facility] Radiology. Please contact [facility] Radiology at [phone] with questions or concerns regarding your invoice.   IF you received labwork today, you will receive an invoice from [place]. Please contact [facility] at [phone] with questions or concerns regarding your invoice.   Our billing staff will not be able to assist you with questions regarding bills from these companies.  You will be contacted with the lab results as soon as they are available. The fastest way to get your results is to activate your My Chart account. Instructions are located on the last page of this paperwork. If you have not heard from [place] regarding the results in 2 weeks, please contact this office.

## 2016-10-16 NOTE — Progress Notes (Signed)
Gary Maddox  MRN: [id number] DOB: [birth year]  PCP: Mancel Bale, PA-C  Chief Complaint  Patient presents with  . Hypertension    Subjective:  Pt presents to clinic for recheck of his BP.  He has had a couple of visits with PT where his BP has been to high to do PT.  He has been taking his medication like Rx.  He has not called back cardiology to make the appt.    Review of Systems  Respiratory: Negative for cough and shortness of breath.   Cardiovascular: Negative for chest pain, palpitations and leg swelling.    Patient Active Problem List   Diagnosis Date Noted  . Type 2 diabetes mellitus without complication, without long-term current use of insulin ([place]) 10/05/2016  . Elevated LDL cholesterol level 10/05/2016  . 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 Essential hypertension 12/21/2006  . GERD 12/21/2006    Current Outpatient Prescriptions on File Prior to Visit  Medication Sig Dispense Refill  . carvedilol (COREG) 12.5 MG tablet Take 12.5 mg by mouth 2 (two) times daily with a meal.     . cloNIDine (CATAPRES) 0.1 MG tablet Take 1 tablet (0.1 mg total) by mouth 2 (two) times daily. 60 tablet 1  . metFORMIN (GLUCOPHAGE XR) 500 MG 24 hr tablet Take 1 tablet (500 mg total) by mouth daily with breakfast. 90 tablet 0  . methocarbamol (ROBAXIN) 500 MG tablet Take 1 tablet (500 mg total) by mouth 2 (two) times daily. (Patient taking differently: Take 500 mg by mouth every 8 (eight) hours as needed for muscle spasms. ) 20 tablet 0  . naproxen (NAPROSYN) 500 MG tablet Take 1 tablet (500 mg total) by mouth 2 (two) times daily. 20 tablet 0  . Olmesartan-Amlodipine-HCTZ (TRIBENZOR) 40-10-25 MG TABS Take 1 tablet by mouth daily.    Marland Kitchen oxymetazoline (AFRIN) 0.05 % nasal spray Place 1 spray into both nostrils 2 (two) times daily as needed for congestion.    . rosuvastatin (CRESTOR) 20 MG tablet Take 1 tablet (20 mg total)  by mouth daily. 90 tablet 0  . sildenafil (REVATIO) 20 MG tablet Take 2-5 tablets by mouth daily as needed for erectile dysfunction.  5  . Tetrahydrozoline HCl (VISINE OP) Place 1 drop into both eyes daily as needed (dry eyes).     No current facility-administered medications on file prior to visit.     No Known Allergies  Pt patients past, family and social history were reviewed and updated.   Objective:  BP (!) 130/92   Pulse (!) 55   Temp 98.2 F (36.8 C) (Oral)   Resp 17   Ht 5\' 11"  (1.803 m)   Wt 183 lb (83 kg)   SpO2 98%   BMI 25.52 kg/m   Physical Exam  Constitutional: He is oriented to person, place, and time and well-developed, well-nourished, and in no distress.  HENT:  Head: Normocephalic and atraumatic.  Right Ear: External ear normal.  Left Ear: External ear normal.  Eyes: Conjunctivae are normal.  Neck: Normal range of motion.  Cardiovascular: Normal rate, regular rhythm, normal heart sounds and intact distal pulses.   Pulmonary/Chest: Effort normal and breath sounds normal. He has no wheezes.  Musculoskeletal:       Right lower leg: He exhibits no edema.       Left lower leg: He exhibits no edema.  Neurological: He is alert and oriented to person,  place, and time. Gait normal.  Skin: Skin is warm and dry.  Psychiatric: Mood, memory, affect and judgment normal.   BP Readings from Last 3 Encounters:  10/16/16 (!) 130/92  10/05/16 (!) 138/98  09/26/16 (!) 167/106     Assessment and Plan :  Essential hypertension - improved since the start of clondiine - I do not want to increase his dose at this time.  He always takes his medications at 7am which is before PT.  A letter was written to check his BP after he has been sitting for a couple of minutes as that has helped in our office.  He will recheck with me in a month and call and get an appt with the cardiologist.  Windell Hummingbird PA-C  [facility] 10/16/2016 12:38 PM

## 2016-10-16 NOTE — Telephone Encounter (Signed)
Saw patient in our office today and his BP was much better.

## 2016-10-30 ENCOUNTER — Telehealth: Payer: Self-pay | Admitting: Physician Assistant

## 2016-10-30 ENCOUNTER — Other Ambulatory Visit: Payer: Self-pay | Admitting: Physician Assistant

## 2016-10-30 NOTE — Telephone Encounter (Signed)
I called the pt to advise him that per Windell Hummingbird he needs to follow up with Cardiology in which the pt has one already scheduled in July, but he stated they already have his medication issue straightened out and told me I was a good lady and Thanked me for my help.

## 2016-10-30 NOTE — Telephone Encounter (Signed)
Pt gets his HCTZ with his Tribenzor.

## 2016-11-03 ENCOUNTER — Ambulatory Visit: Payer: BC Managed Care – PPO | Admitting: Physician Assistant

## 2016-11-10 ENCOUNTER — Encounter: Payer: Self-pay | Admitting: Physician Assistant

## 2016-11-10 ENCOUNTER — Ambulatory Visit (INDEPENDENT_AMBULATORY_CARE_PROVIDER_SITE_OTHER): Payer: BC Managed Care – PPO | Admitting: Physician Assistant

## 2016-11-10 VITALS — BP 112/80 | HR 73 | Temp 97.9°F | Resp 18 | Ht 71.0 in | Wt 184.6 lb

## 2016-11-10 DIAGNOSIS — E7800 Pure hypercholesterolemia, unspecified: Secondary | ICD-10-CM

## 2016-11-10 DIAGNOSIS — I10 Essential (primary) hypertension: Secondary | ICD-10-CM | POA: Diagnosis not present

## 2016-11-10 DIAGNOSIS — R413 Other amnesia: Secondary | ICD-10-CM

## 2016-11-10 MED ORDER — CARVEDILOL 12.5 MG PO TABS: 12.5000 mg | ORAL_TABLET | Freq: Two times a day (BID) | ORAL | 0 refills | Status: DC

## 2016-11-10 MED ORDER — ROSUVASTATIN CALCIUM 20 MG PO TABS: 20.0000 mg | ORAL_TABLET | Freq: Every day | ORAL | 0 refills | Status: DC

## 2016-11-10 MED ORDER — OLMESARTAN-AMLODIPINE-HCTZ 40-10-25 MG PO TABS: 1.0000 | ORAL_TABLET | Freq: Every day | ORAL | 0 refills | Status: DC

## 2016-11-10 NOTE — Patient Instructions (Signed)
     IF you received an x-ray today, you will receive an invoice from [facility]. Please contact [facility] at [phone] with questions or concerns regarding your invoice.   IF you received labwork today, you will receive an invoice from [facility]. Please contact [facility] at [phone] with questions or concerns regarding your invoice.   Our billing staff will not be able to assist you with questions regarding bills from these companies.  You will be contacted with the lab results as soon as they are available. The fastest way to get your results is to activate your My Chart account. Instructions are located on the last page of this paperwork. If you have not heard from us regarding the results in 2 weeks, please contact this office.     

## 2016-11-10 NOTE — Progress Notes (Signed)
Gary Maddox  MRN: [id number] DOB: 01-Jul-1950  PCP: Mancel Bale, PA-C  Chief Complaint  Patient presents with  . Hypertension    follow up     Subjective:  Pt presents to clinic for BP f/u.  He feels like there was a problem at the pharmacy with his Tribenzor - he has now been back on the medication since 6/15 and his BP has been controlled and he has been able to do PT from a WC injury.  He does not think he has been using the coreg.  Still having problems with directions - he always gets the mail and then writes the checks for the bills but he has forgotten to do that many times.  He is not having trouble with medications.  Not drinking any ETOH.  Sleeping ok at night.  Eating ok.  He feels like this has been going on since he fell at work about 3 months ago.  Seems to be getting worse not in in severity and frequency.  He has not been evaluated by neurology - he has not talked to his company about the fact that he thinks this has happened since his fall at work.  He does now have to use cane to walk due to unsteadiness.  He got hurt on the job - he slipped and feel.  He has been seeing [employer] - Dr Mina Marble - going to give him some injections. Dr Berenice Primas is following his WC injury in regards to his back.  Called the pharmacy - the patient last picked up his tribenzor and Coreg in Feb a 30d supply.  Review of Systems  Patient Active Problem List   Diagnosis Date Noted  . Type 2 diabetes mellitus without complication, without long-term current use of insulin ([place]) 10/05/2016  . Elevated LDL cholesterol level 10/05/2016  . 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 Essential hypertension 12/21/2006  . GERD 12/21/2006    Current Outpatient Prescriptions on File Prior to Visit  Medication Sig Dispense Refill  . metFORMIN (GLUCOPHAGE XR) 500 MG 24 hr tablet Take 1 tablet (500 mg total) by mouth daily with  breakfast. 90 tablet 0  . methocarbamol (ROBAXIN) 500 MG tablet Take 1 tablet (500 mg total) by mouth 2 (two) times daily. (Patient taking differently: Take 500 mg by mouth every 8 (eight) hours as needed for muscle spasms. ) 20 tablet 0  . sildenafil (REVATIO) 20 MG tablet Take 2-5 tablets by mouth daily as needed for erectile dysfunction.  5  . Tetrahydrozoline HCl (VISINE OP) Place 1 drop into both eyes daily as needed (dry eyes).     No current facility-administered medications on file prior to visit.     No Known Allergies  Pt patients past, family and social history were reviewed and updated.   Objective:  BP 112/80   Pulse 73   Temp 97.9 F (36.6 C) (Oral)   Resp 18   Ht '5\' 11"'  (1.803 m)   Wt 184 lb 9.6 oz (83.7 kg)   SpO2 97%   BMI 25.75 kg/m   Physical Exam  Constitutional: He is oriented to person, place, and time and well-developed, well-nourished, and in no distress.  HENT:  Head: Normocephalic and atraumatic.  Right Ear: External ear normal.  Left Ear: External ear normal.  Eyes: Conjunctivae are normal.  Neck: Normal range of motion.  Cardiovascular: Normal rate, regular rhythm and normal heart sounds.  No murmur heard. Pulmonary/Chest: Effort normal and breath sounds normal. He has no wheezes.  Neurological: He is alert and oriented to person, place, and time. He has normal sensation, normal strength, normal reflexes and intact cranial nerves. He displays abnormal speech (slightly slowed). He displays no weakness, facial symmetry and normal reflexes. He has a normal Straight Leg Raise Test, a normal Cerebellar Exam, a normal Romberg Test and a normal Tandem Gait Test. He shows no pronator drift. Gait (unsteady) abnormal. Coordination normal.  Skin: Skin is warm and dry.  Psychiatric: Mood, memory, affect and judgment normal.    Assessment and Plan :  Memory deficit - Plan: Ambulatory referral to Neurology  Elevated LDL cholesterol level - Plan: rosuvastatin  (CRESTOR) 20 MG tablet  Essential hypertension - Plan: carvedilol (COREG) 12.5 MG tablet, Olmesartan-Amlodipine-HCTZ (TRIBENZOR) 40-10-25 MG TABS   Pt has had some confusion on his BP.  I meet him for the 1st time 5/12 with uncontrolled BP - at that time he told me he had been taking both tribenzor and Coreg though we have now determined that he had not been taking the medications.  Since he has been back on the tribenzor his BP has been well controlled.  Since he has not been on the Coreg I would like for the patient to be back on that and not the Clonidine which he has been on the last month.  He will stop that and restart the Coreg.  He will also restart his Crestor and start his Metformin because he has new onset DM type 2.  His memory problems have lead him to an ED visit and some medication confusion.  He seems to feel this has been going on since his WC injury but his injury happened before I met him. He needs to see an neurologist - he was seen in the ED but they were unable to do an MRI - I will let the neurologist order this test.  If he believes this might be related to his WC claim he needs to contact his nurse case manager abou the next step.  He has an appt with the cardiologist next month.  He left with a medication list of what he should and should not be taking - he had been using Afrin daily and he was instructed to stop but educated that he will likely get some congested over the next week but he will get better as his body adjusts.  Windell Hummingbird PA-C  [facility] 11/10/2016 4:21 PM

## 2016-11-11 ENCOUNTER — Telehealth: Payer: Self-pay | Admitting: Physician Assistant

## 2016-11-11 NOTE — Telephone Encounter (Signed)
Pt states that the pharmacy does not have the new medication Judson Roch started him on yesterday and not sure what to do next tetrahydrozoline hci

## 2016-11-18 ENCOUNTER — Other Ambulatory Visit: Payer: Self-pay | Admitting: Physician Assistant

## 2016-11-18 DIAGNOSIS — I10 Essential (primary) hypertension: Secondary | ICD-10-CM

## 2016-11-20 ENCOUNTER — Encounter: Payer: Self-pay | Admitting: Neurology

## 2016-11-24 NOTE — Progress Notes (Deleted)
Referring-Sarah Alleen Borne, PA-C Reason for referral-Hypertension  HPI: 66 yo male for evaluation of hypertension at request of Mancel Bale, PA-C.  Current Outpatient Prescriptions  Medication Sig Dispense Refill  . carvedilol (COREG) 12.5 MG tablet Take 1 tablet (12.5 mg total) by mouth 2 (two) times daily with a meal. 180 tablet 0  . metFORMIN (GLUCOPHAGE XR) 500 MG 24 hr tablet Take 1 tablet (500 mg total) by mouth daily with breakfast. 90 tablet 0  . methocarbamol (ROBAXIN) 500 MG tablet Take 1 tablet (500 mg total) by mouth 2 (two) times daily. (Patient taking differently: Take 500 mg by mouth every 8 (eight) hours as needed for muscle spasms. ) 20 tablet 0  . Olmesartan-Amlodipine-HCTZ (TRIBENZOR) 40-10-25 MG TABS Take 1 tablet by mouth daily. 90 tablet 0  . rosuvastatin (CRESTOR) 20 MG tablet Take 1 tablet (20 mg total) by mouth daily. 90 tablet 0  . sildenafil (REVATIO) 20 MG tablet Take 2-5 tablets by mouth daily as needed for erectile dysfunction.  5  . Tetrahydrozoline HCl (VISINE OP) Place 1 drop into both eyes daily as needed (dry eyes).     No current facility-administered medications for this visit.     No Known Allergies  Past Medical History:  Diagnosis Date  . ABUSE, ALCOHOL, IN REMISSION 12/24/2006   Qualifier: Diagnosis of  By: Leward Quan MD, Pamala Hurry    . Elevated LDL cholesterol level 10/05/2016  . Essential hypertension 12/21/2006   Qualifier: Diagnosis of  By: Leward Quan MD, Pamala Hurry    . GERD 12/21/2006   Qualifier: Diagnosis of  By: Leward Quan MD, Pamala Hurry    . HEPATITIS C, CHRONIC VIRAL, W/O HEPATIC COMA 12/24/2006   Qualifier: Diagnosis of  By: Leward Quan MD, Pamala Hurry    . Hypertension   . Type 2 diabetes mellitus without complication, without long-term current use of insulin ([place]) 10/05/2016    No past surgical history on file.  Social History   Social History  . Marital status: Married    Spouse name: N/A  . Number of children: N/A  . Years of education:  N/A   Occupational History  . sports maintanence    Social History Main Topics  . Smoking status: Never Smoker  . Smokeless tobacco: Never Used  . Alcohol use No     Comment: once a week - 1/2 pint  . Drug use: No  . Sexual activity: Yes   Other Topics Concern  . Not on file   Social History Narrative   Lives with wife   2 children -    18 - Grandchildren      Seatbelt - 100%   Gun in home - no    Family History  Problem Relation Age of Onset  . Hypertension Mother   . Hypertension Father     ROS: no fevers or chills, productive cough, hemoptysis, dysphasia, odynophagia, melena, hematochezia, dysuria, hematuria, rash, seizure activity, orthopnea, PND, pedal edema, claudication. Remaining systems are negative.  Physical Exam:   There were no vitals taken for this visit.  General:  Well developed/well nourished in NAD Skin warm/dry Patient not depressed No peripheral clubbing Back-normal HEENT-normal/normal eyelids Neck supple/normal carotid upstroke bilaterally; no bruits; no JVD; no thyromegaly chest - CTA/ normal expansion CV - RRR/normal S1 and S2; no murmurs, rubs or gallops;  PMI nondisplaced Abdomen -NT/ND, no HSM, no mass, + bowel sounds, no bruit 2+ femoral pulses, no bruits Ext-no edema, chords, 2+ DP Neuro-grossly nonfocal  ECG - personally reviewed  A/P  1  Kirk Ruths, MD

## 2016-11-30 ENCOUNTER — Ambulatory Visit (INDEPENDENT_AMBULATORY_CARE_PROVIDER_SITE_OTHER): Payer: BC Managed Care – PPO | Admitting: Physician Assistant

## 2016-11-30 ENCOUNTER — Encounter: Payer: Self-pay | Admitting: Physician Assistant

## 2016-11-30 VITALS — BP 136/87 | HR 57 | Temp 98.0°F | Resp 18 | Ht 71.0 in | Wt 179.6 lb

## 2016-11-30 DIAGNOSIS — I10 Essential (primary) hypertension: Secondary | ICD-10-CM | POA: Diagnosis not present

## 2016-11-30 DIAGNOSIS — Z945 Skin transplant status: Secondary | ICD-10-CM | POA: Diagnosis not present

## 2016-11-30 DIAGNOSIS — E119 Type 2 diabetes mellitus without complications: Secondary | ICD-10-CM

## 2016-11-30 MED ORDER — SILVER SULFADIAZINE 1 % EX CREA: 1.0000 "application " | TOPICAL_CREAM | Freq: Every day | CUTANEOUS | 1 refills | Status: DC

## 2016-11-30 NOTE — Progress Notes (Signed)
Gary Maddox  MRN: [id number] DOB: Oct 20, 1950  PCP: Mancel Bale, PA-C  Chief Complaint  Patient presents with  . Follow-up    medication   . Medication Refill    Silvaden cream     Subjective:  Pt presents to clinic for recheck of his BP and general f/u.  He also needs refills of his silvadene cream which he uses when he has skin breakdown from his past skin graft on the bottoms of his feet.  He feels good.  He has been able to do PT since the BP medication problem got sorted out.    Review of Systems  Respiratory: Negative for cough and shortness of breath.   Cardiovascular: Negative for chest pain, palpitations and leg swelling.  Skin: Positive for wound (from skin break down on soles of feet where skin grafts are).    Patient Active Problem List   Diagnosis Date Noted  . Type 2 diabetes mellitus without complication, without long-term current use of insulin ([place]) 10/05/2016  . Elevated LDL cholesterol level 10/05/2016  . 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 Essential hypertension 12/21/2006  . GERD 12/21/2006    Current Outpatient Prescriptions on File Prior to Visit  Medication Sig Dispense Refill  . carvedilol (COREG) 12.5 MG tablet Take 1 tablet (12.5 mg total) by mouth 2 (two) times daily with a meal. 180 tablet 0  . metFORMIN (GLUCOPHAGE XR) 500 MG 24 hr tablet Take 1 tablet (500 mg total) by mouth daily with breakfast. 90 tablet 0  . methocarbamol (ROBAXIN) 500 MG tablet Take 1 tablet (500 mg total) by mouth 2 (two) times daily. (Patient taking differently: Take 500 mg by mouth every 8 (eight) hours as needed for muscle spasms. ) 20 tablet 0  . Olmesartan-Amlodipine-HCTZ (TRIBENZOR) 40-10-25 MG TABS Take 1 tablet by mouth daily. 90 tablet 0  . rosuvastatin (CRESTOR) 20 MG tablet Take 1 tablet (20 mg total) by mouth daily. 90 tablet 0  . sildenafil (REVATIO) 20 MG tablet Take 2-5 tablets by mouth  daily as needed for erectile dysfunction.  5  . Tetrahydrozoline HCl (VISINE OP) Place 1 drop into both eyes daily as needed (dry eyes).     No current facility-administered medications on file prior to visit.     No Known Allergies  Pt patients past, family and social history were reviewed and updated.   Objective:  BP 136/87   Pulse (!) 57   Temp 98 F (36.7 C) (Oral)   Resp 18   Ht 5\' 11"  (1.803 m)   Wt 179 lb 9.6 oz (81.5 kg)   SpO2 99%   BMI 25.05 kg/m   Physical Exam  Constitutional: He is oriented to person, place, and time and well-developed, well-nourished, and in no distress.  HENT:  Head: Normocephalic and atraumatic.  Right Ear: External ear normal.  Left Ear: External ear normal.  Eyes: Conjunctivae are normal.  Neck: Normal range of motion.  Cardiovascular: Normal rate, regular rhythm and normal heart sounds.   No murmur heard. Pulmonary/Chest: Effort normal and breath sounds normal. He has no wheezes.  Neurological: He is alert and oriented to person, place, and time. Gait normal.  Skin: Skin is warm and dry.  Skin grafts well healed.  A small healing wound on the bottom on the left foot.  No callus formation on the skin graft.  Psychiatric: Mood, memory, affect and judgment normal.  Assessment and Plan :  Essential hypertension  S/P split thickness skin graft - Plan: silver sulfADIAZINE (SILVADENE) 1 % cream  Type 2 diabetes mellitus without complication, without long-term current use of insulin ([facility])   Continue current medications - recheck for DM labs and general labs in 2 months - he will have been on metformin for 3 months at that time.  Windell Hummingbird PA-C  [facility] 11/30/2016 9:07 AM

## 2016-11-30 NOTE — Telephone Encounter (Signed)
This message was never routed.  At the patient's appt his medications had been taken care of.

## 2016-11-30 NOTE — Patient Instructions (Signed)
     IF you received an x-ray today, you will receive an invoice from [facility]. Please contact [facility] at [phone] with questions or concerns regarding your invoice.   IF you received labwork today, you will receive an invoice from [facility]. Please contact [facility] at [phone] with questions or concerns regarding your invoice.   Our billing staff will not be able to assist you with questions regarding bills from these companies.  You will be contacted with the lab results as soon as they are available. The fastest way to get your results is to activate your My Chart account. Instructions are located on the last page of this paperwork. If you have not heard from us regarding the results in 2 weeks, please contact this office.     

## 2016-12-03 ENCOUNTER — Ambulatory Visit: Payer: BC Managed Care – PPO | Admitting: Cardiology

## 2016-12-18 ENCOUNTER — Ambulatory Visit (INDEPENDENT_AMBULATORY_CARE_PROVIDER_SITE_OTHER): Payer: BC Managed Care – PPO | Admitting: Physician Assistant

## 2016-12-18 ENCOUNTER — Encounter: Payer: Self-pay | Admitting: Physician Assistant

## 2016-12-18 VITALS — BP 105/76 | HR 56 | Temp 97.8°F | Resp 18 | Ht 71.0 in | Wt 180.4 lb

## 2016-12-18 DIAGNOSIS — S29011A Strain of muscle and tendon of front wall of thorax, initial encounter: Secondary | ICD-10-CM | POA: Diagnosis not present

## 2016-12-18 DIAGNOSIS — H04123 Dry eye syndrome of bilateral lacrimal glands: Secondary | ICD-10-CM

## 2016-12-18 MED ORDER — MELOXICAM 7.5 MG PO TABS: 7.5000 mg | ORAL_TABLET | Freq: Every day | ORAL | 0 refills | Status: DC

## 2016-12-18 MED ORDER — TETRAHYDROZOLINE HCL 0.05 % OP SOLN: 1.0000 [drp] | Freq: Every day | OPHTHALMIC | 0 refills | Status: DC | PRN

## 2016-12-18 NOTE — Progress Notes (Signed)
Gary Maddox  MRN: [id number] DOB: 04/28/1951  PCP: Mancel Bale, PA-C  Chief Complaint  Patient presents with  . Shoulder Pain    left shoulder pain x1week hurts to get out of bed     Subjective:  Pt presents to clinic for left chest wall pain - it has not gotten worse but it is irritating him more.  With rest he knows it is there but with pushing off like getting out of bed it is hurting him.  No pain radiation.  No paresthesias.  No shoulder pain.  No injury that he knows of.  Pain is reproducible but using the muscle or using the muscle.  Right handed  He has been doing PT some upper body for about 3 weeks.  History is obtained by patient.  Review of Systems  Constitutional: Negative for chills and fever.  Respiratory: Negative for shortness of breath.   Cardiovascular: Negative for chest pain and palpitations.  Gastrointestinal: Negative for nausea.  Musculoskeletal: Positive for back pain (continued for pain) and myalgias.       Walks with cane    Patient Active Problem List   Diagnosis Date Noted  . Type 2 diabetes mellitus without complication, without long-term current use of insulin ([place]) 10/05/2016  . Elevated LDL cholesterol level 10/05/2016  . 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 Essential hypertension 12/21/2006  . GERD 12/21/2006    Current Outpatient Prescriptions on File Prior to Visit  Medication Sig Dispense Refill  . carvedilol (COREG) 12.5 MG tablet Take 1 tablet (12.5 mg total) by mouth 2 (two) times daily with a meal. 180 tablet 0  . metFORMIN (GLUCOPHAGE XR) 500 MG 24 hr tablet Take 1 tablet (500 mg total) by mouth daily with breakfast. 90 tablet 0  . methocarbamol (ROBAXIN) 500 MG tablet Take 1 tablet (500 mg total) by mouth 2 (two) times daily. (Patient taking differently: Take 500 mg by mouth every 8 (eight) hours as needed for muscle spasms. ) 20 tablet 0  .  Olmesartan-Amlodipine-HCTZ (TRIBENZOR) 40-10-25 MG TABS Take 1 tablet by mouth daily. 90 tablet 0  . rosuvastatin (CRESTOR) 20 MG tablet Take 1 tablet (20 mg total) by mouth daily. 90 tablet 0  . sildenafil (REVATIO) 20 MG tablet Take 2-5 tablets by mouth daily as needed for erectile dysfunction.  5  . silver sulfADIAZINE (SILVADENE) 1 % cream Apply 1 application topically daily. 50 g 1   No current facility-administered medications on file prior to visit.     No Known Allergies  Past Medical History:  Diagnosis Date  . ABUSE, ALCOHOL, IN REMISSION 12/24/2006   Qualifier: Diagnosis of  By: Leward Quan MD, Pamala Hurry    . Elevated LDL cholesterol level 10/05/2016  . Essential hypertension 12/21/2006   Qualifier: Diagnosis of  By: Leward Quan MD, Pamala Hurry    . GERD 12/21/2006   Qualifier: Diagnosis of  By: Leward Quan MD, Pamala Hurry    . HEPATITIS C, CHRONIC VIRAL, W/O HEPATIC COMA 12/24/2006   Qualifier: Diagnosis of  By: Leward Quan MD, Pamala Hurry    . Hypertension   . Type 2 diabetes mellitus without complication, without long-term current use of insulin ([place]) 10/05/2016   Social History   Social History Narrative   Lives with wife   2 children -    80 - Grandchildren      Seatbelt - 100%   Gun in home - no   Social History  Substance  Use Topics  . Smoking status: Never Smoker  . Smokeless tobacco: Never Used  . Alcohol use No     Comment: once a week - 1/2 pint   family history includes Hypertension in his father and mother.     Objective:  BP 105/76   Pulse (!) 56   Temp 97.8 F (36.6 C) (Oral)   Resp 18   Ht 5\' 11"  (1.803 m)   Wt 180 lb 6.4 oz (81.8 kg)   SpO2 98%   BMI 25.16 kg/m  Body mass index is 25.16 kg/m.  Physical Exam  Constitutional: He is oriented to person, place, and time and well-developed, well-nourished, and in no distress.  HENT:  Head: Normocephalic and atraumatic.  Right Ear: External ear normal.  Left Ear: External ear normal.  Eyes: Conjunctivae are  normal.  Neck: Normal range of motion.  Cardiovascular: Normal rate, regular rhythm and normal heart sounds.   No murmur heard. Pulmonary/Chest: Effort normal and breath sounds normal. He has no wheezes.  Musculoskeletal:       Right shoulder: Normal.       Left shoulder: Normal.       Arms: Neurological: He is alert and oriented to person, place, and time. Gait normal.  Skin: Skin is warm and dry.  Psychiatric: Mood, memory, affect and judgment normal.    Assessment and Plan :  Dry eyes - Plan: tetrahydrozoline (VISINE) 0.05 % ophthalmic solution  Pectoralis muscle strain, initial encounter - Plan: meloxicam (MOBIC) 7.5 MG tablet ice and rest - no tylenol due to liver problems - pt can use mobic to help with the pain short term.  Windell Hummingbird PA-C  [facility] at [facility] 12/18/2016 12:19 PM

## 2016-12-18 NOTE — Patient Instructions (Addendum)
  Ice to the sore muscle  No tylenol due to liver enzymes being high  Use medication for pain that I sent to the pharmacy   IF you received an x-ray today, you will receive an invoice from [facility]. Please contact [facility] at [phone] with questions or concerns regarding your invoice.   IF you received labwork today, you will receive an invoice from [facility]. Please contact [facility] at [phone] with questions or concerns regarding your invoice.   Our billing staff will not be able to assist you with questions regarding bills from these companies.  You will be contacted with the lab results as soon as they are available. The fastest way to get your results is to activate your My Chart account. Instructions are located on the last page of this paperwork. If you have not heard from [place] regarding the results in 2 weeks, please contact this office.

## 2016-12-30 NOTE — Progress Notes (Signed)
Referring-Gary Alleen Borne, PA-C Reason for referral-HTN  HPI: 66 year old male for evaluation of hypertension at request of Mittie Bodo. Patient denies dyspnea, chest pain, palpitations or syncope. Because of his hypertension we were asked to evaluate.  Current Outpatient Prescriptions  Medication Sig Dispense Refill  . carvedilol (COREG) 12.5 MG tablet Take 1 tablet (12.5 mg total) by mouth 2 (two) times daily with a meal. 180 tablet 0  . gabapentin (NEURONTIN) 300 MG capsule TAKE 1 CAPSULE BY MOUTH DAILY FOR 1 WEEK, THEN INCREASE TO TWICE DAILY AS TOLERATED  0  . meloxicam (MOBIC) 7.5 MG tablet Take 1 tablet (7.5 mg total) by mouth daily. 30 tablet 0  . metFORMIN (GLUCOPHAGE XR) 500 MG 24 hr tablet Take 1 tablet (500 mg total) by mouth daily with breakfast. 90 tablet 0  . methocarbamol (ROBAXIN) 500 MG tablet Take 1 tablet (500 mg total) by mouth 2 (two) times daily. (Patient taking differently: Take 500 mg by mouth every 8 (eight) hours as needed for muscle spasms. ) 20 tablet 0  . Olmesartan-Amlodipine-HCTZ (TRIBENZOR) 40-10-25 MG TABS Take 1 tablet by mouth daily. 90 tablet 0  . rosuvastatin (CRESTOR) 20 MG tablet Take 1 tablet (20 mg total) by mouth daily. 90 tablet 0  . sildenafil (REVATIO) 20 MG tablet Take 2-5 tablets by mouth daily as needed for erectile dysfunction.  5  . silver sulfADIAZINE (SILVADENE) 1 % cream Apply 1 application topically daily. 50 g 1  . tetrahydrozoline (VISINE) 0.05 % ophthalmic solution Place 1 drop into both eyes daily as needed (dry eyes). 15 mL 0   No current facility-administered medications for this visit.     No Known Allergies   Past Medical History:  Diagnosis Date  . ABUSE, ALCOHOL, IN REMISSION 12/24/2006   Qualifier: Diagnosis of  By: Leward Quan MD, Pamala Hurry    . Elevated LDL cholesterol level 10/05/2016  . Essential hypertension 12/21/2006   Qualifier: Diagnosis of  By: Leward Quan MD, Pamala Hurry    . GERD 12/21/2006   Qualifier: Diagnosis  of  By: Leward Quan MD, Pamala Hurry    . HEPATITIS C, CHRONIC VIRAL, W/O HEPATIC COMA 12/24/2006   Qualifier: Diagnosis of  By: Leward Quan MD, Pamala Hurry    . Type 2 diabetes mellitus without complication, without long-term current use of insulin ([place]) 10/05/2016    Past Surgical History:  Procedure Laterality Date  . FOOT SURGERY    . KNEE SURGERY      Social History   Social History  . Marital status: Married    Spouse name: N/A  . Number of children: 8  . Years of education: N/A   Occupational History  . sports maintanence    Social History Main Topics  . Smoking status: Never Smoker  . Smokeless tobacco: Never Used  . Alcohol use No     Comment: once a week - 1/2 pint  . Drug use: No  . Sexual activity: Yes   Other Topics Concern  . Not on file   Social History Narrative   Lives with wife   2 children -    54 - Grandchildren      Seatbelt - 100%   Gun in home - no    Family History  Problem Relation Age of Onset  . Hypertension Mother   . Hypertension Father     ROS: Back and right lower extremity pain but no fevers or chills, productive cough, hemoptysis, dysphasia, odynophagia, melena, hematochezia, dysuria, hematuria, rash, seizure activity, orthopnea,  PND, pedal edema, claudication. Remaining systems are negative.  Physical Exam:   Blood pressure 120/86, pulse 60, height 5\' 11"  (1.803 m), weight 82.6 kg (182 lb).  General:  Well developed/well nourished in NAD Skin warm/dry Patient not depressed No peripheral clubbing Back-normal HEENT-normal/normal eyelids Neck supple/normal carotid upstroke bilaterally; no bruits; no JVD; no thyromegaly chest - CTA/ normal expansion CV - RRR/normal S1 and S2; no murmurs, rubs or gallops;  PMI nondisplaced Abdomen -NT/ND, no HSM, no mass, + bowel sounds, no bruit 2+ femoral pulses, no bruits Ext-no edema, chords, 2+ DP Neuro-grossly nonfocal  ECG - 09/26/2016-sinus bradycardia, lateral T-wave inversion. personally  reviewed  A/P  1 hypertension-blood pressure is well controlled on his present medical regimen. We will make no changes today. Patient should continue with lifestyle modification including low sodium diet.  2 hyperlipidemia-continue statin.  3 abnormal electrocardiogram-patient does have lateral T-wave inversion. However he is not having any cardiac symptoms including no dyspnea or chest pain. No palpitations or syncope. I do not think we need to pursue further ischemia evaluation.  4 diabetes mellitus-management per primary care.  Kirk Ruths, MD

## 2016-12-31 ENCOUNTER — Encounter: Payer: Self-pay | Admitting: Cardiology

## 2016-12-31 ENCOUNTER — Ambulatory Visit (INDEPENDENT_AMBULATORY_CARE_PROVIDER_SITE_OTHER): Payer: BC Managed Care – PPO | Admitting: Cardiology

## 2016-12-31 VITALS — BP 120/86 | HR 60 | Ht 71.0 in | Wt 182.0 lb

## 2016-12-31 DIAGNOSIS — E7800 Pure hypercholesterolemia, unspecified: Secondary | ICD-10-CM | POA: Diagnosis not present

## 2016-12-31 DIAGNOSIS — R9431 Abnormal electrocardiogram [ECG] [EKG]: Secondary | ICD-10-CM | POA: Diagnosis not present

## 2016-12-31 DIAGNOSIS — I10 Essential (primary) hypertension: Secondary | ICD-10-CM

## 2016-12-31 NOTE — Patient Instructions (Signed)
Your physician recommends that you schedule a follow-up appointment in: AS NEEDED  

## 2017-01-01 ENCOUNTER — Ambulatory Visit (INDEPENDENT_AMBULATORY_CARE_PROVIDER_SITE_OTHER): Payer: BC Managed Care – PPO | Admitting: Neurology

## 2017-01-01 ENCOUNTER — Encounter: Payer: Self-pay | Admitting: Neurology

## 2017-01-01 VITALS — BP 104/68 | HR 53 | Ht 72.0 in | Wt 182.0 lb

## 2017-01-01 DIAGNOSIS — R413 Other amnesia: Secondary | ICD-10-CM
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
## 2017-01-01 NOTE — Patient Instructions (Addendum)
1. Schedule MRI brain without contrast. We have sent a referral to [facility] for your MRI and they will call you directly to schedule your appt. They are located at [place]. If you need to contact them directly please call [phone].   2. Schedule Neurocognitive testing with Dr. Otelia Limes have been referred for a neurocognitive evaluation in our office.   The evaluation takes approximately two hours. The first part of the appointment is a clinical interview with the neuropsychologist (Dr. Macarthur Critchley). Please bring someone with you to this appointment if possible, as it is helpful for Dr. Si Raider to hear from both you and another adult who knows you well. After speaking with Dr. Si Raider, you will complete testing with her technician. The testing includes a variety of tasks- mostly question-and-answer, some paper-and-pencil. There is nothing you need to do to prepare for this appointment, but having a good night's sleep prior to the testing, and bringing eyeglasses and hearing aids (if you wear them), is advised.   About a week after the evaluation, you will return to follow up with Dr. Si Raider to review the test results. This appointment is about 30 minutes. If you would like a family member to receive this information as well, please bring them to the appointment.   We have to reserve several hours of the neuropsychologist's time and the psychometrician's time for your evaluation appointment. As such, please note that there is a No-Show fee of $100. If you are unable to attend any of your appointments, please contact our office as soon as possible to reschedule.

## 2017-01-05 ENCOUNTER — Other Ambulatory Visit: Payer: Self-pay | Admitting: Physician Assistant

## 2017-01-05 DIAGNOSIS — S29011A Strain of muscle and tendon of front wall of thorax, initial encounter: Secondary | ICD-10-CM

## 2017-01-05 NOTE — Telephone Encounter (Signed)
Please advise 

## 2017-01-05 NOTE — Telephone Encounter (Signed)
This was just filled on 8/3.  He should not need refill.

## 2017-01-27 ENCOUNTER — Ambulatory Visit (INDEPENDENT_AMBULATORY_CARE_PROVIDER_SITE_OTHER): Payer: BC Managed Care – PPO | Admitting: Emergency Medicine

## 2017-01-27 ENCOUNTER — Encounter: Payer: Self-pay | Admitting: Emergency Medicine

## 2017-01-27 VITALS — BP 134/83 | HR 61 | Temp 98.0°F | Resp 17 | Ht 71.0 in | Wt 182.0 lb

## 2017-01-27 DIAGNOSIS — B029 Zoster without complications: Secondary | ICD-10-CM | POA: Diagnosis not present

## 2017-01-27 MED ORDER — VALACYCLOVIR HCL 1 G PO TABS: 1000.0000 mg | ORAL_TABLET | Freq: Two times a day (BID) | ORAL | 1 refills | Status: DC

## 2017-01-27 NOTE — Patient Instructions (Addendum)
   IF you received an x-ray today, you will receive an invoice from [facility]. Please contact [facility] at [phone] with questions or concerns regarding your invoice.   IF you received labwork today, you will receive an invoice from [facility]. Please contact [facility] at [phone] with questions or concerns regarding your invoice.   Our billing staff will not be able to assist you with questions regarding bills from these companies.  You will be contacted with the lab results as soon as they are available. The fastest way to get your results is to activate your My Chart account. Instructions are located on the last page of this paperwork. If you have not heard from us regarding the results in 2 weeks, please contact this office.     Shingles Shingles is an infection that causes a painful skin rash and fluid-filled blisters. Shingles is caused by the same virus that causes chickenpox. Shingles only develops in people who:  Have had chickenpox.  Have gotten the chickenpox vaccine. (This is rare.)  The first symptoms of shingles may be itching, tingling, or pain in an area on your skin. A rash will follow in a few days or weeks. The rash is usually on one side of the body in a bandlike or beltlike pattern. Over time, the rash turns into fluid-filled blisters that break open, scab over, and dry up. Medicines may:  Help you manage pain.  Help you recover more quickly.  Help to prevent long-term problems.  Follow these instructions at home: Medicines  Take medicines only as told by your doctor.  Apply an anti-itch or numbing cream to the affected area as told by your doctor. Blister and Rash Care  Take a cool bath or put cool compresses on the area of the rash or blisters as told by your doctor. This may help with pain and itching.  Keep your rash covered with a loose bandage (dressing). Wear loose-fitting clothing.  Keep your rash and blisters clean  with mild soap and cool water or as told by your doctor.  Check your rash every day for signs of infection. These include redness, swelling, and pain that lasts or gets worse.  Do not pick your blisters.  Do not scratch your rash. General instructions  Rest as told by your doctor.  Keep all follow-up visits as told by your doctor. This is important.  Until your blisters scab over, your infection can cause chickenpox in people who have never had it or been vaccinated against it. To prevent this from happening, avoid touching other people or being around other people, especially: ? Babies. ? Pregnant women. ? Children who have eczema. ? Elderly people who have transplants. ? People who have chronic illnesses, such as leukemia or AIDS. Contact a doctor if:  Your pain does not get better with medicine.  Your pain does not get better after the rash heals.  Your rash looks infected. Signs of infection include: ? Redness. ? Swelling. ? Pain that lasts or gets worse. Get help right away if:  The rash is on your face or nose.  You have pain in your face, pain around your eye area, or loss of feeling on one side of your face.  You have ear pain or you have ringing in your ear.  You have loss of taste.  Your condition gets worse. This information is not intended to replace advice given to you by your health care provider. Make sure you discuss any questions   you have with your health care provider. Document Released: 10/21/2007 Document Revised: 12/29/2015 Document Reviewed: 02/13/2014 Elsevier Interactive Patient Education  2018 [employer]  

## 2017-01-27 NOTE — Progress Notes (Signed)
Gary Maddox 66 y.o.   Chief Complaint  Patient presents with  . patient thinks he has shingles    HISTORY OF PRESENT ILLNESS: This is a 66 y.o. male complaining of itchy rash to right upper chest.  HPI   Prior to Admission medications   Medication Sig Start Date End Date Taking? Authorizing Provider  carvedilol (COREG) 12.5 MG tablet Take 1 tablet (12.5 mg total) by mouth 2 (two) times daily with a meal. 11/10/16  Yes Weber, Sarah L, PA-C  gabapentin (NEURONTIN) 300 MG capsule TAKE 1 CAPSULE BY MOUTH DAILY FOR 1 WEEK, THEN INCREASE TO TWICE DAILY AS TOLERATED 12/15/16  Yes [provider]  meloxicam (MOBIC) 7.5 MG tablet take 1 tablet by mouth once daily 01/05/17  Yes Weber, Sarah L, PA-C  metFORMIN (GLUCOPHAGE XR) 500 MG 24 hr tablet Take 1 tablet (500 mg total) by mouth daily with breakfast. 10/05/16  Yes Weber, Sarah L, PA-C  methocarbamol (ROBAXIN) 500 MG tablet Take 1 tablet (500 mg total) by mouth 2 (two) times daily. Patient taking differently: Take 500 mg by mouth every 8 (eight) hours as needed for muscle spasms.  08/12/16  Yes Etta Quill, NP  Olmesartan-Amlodipine-HCTZ (TRIBENZOR) 40-10-25 MG TABS Take 1 tablet by mouth daily. 11/10/16  Yes Weber, Sarah L, PA-C  rosuvastatin (CRESTOR) 20 MG tablet Take 1 tablet (20 mg total) by mouth daily. 11/10/16  Yes Weber, Sarah L, PA-C  sildenafil (REVATIO) 20 MG tablet Take 2-5 tablets by mouth daily as needed for erectile dysfunction. 08/17/16  Yes [provider]  silver sulfADIAZINE (SILVADENE) 1 % cream Apply 1 application topically daily. 11/30/16  Yes Weber, Damaris Hippo, PA-C  tetrahydrozoline (VISINE) 0.05 % ophthalmic solution Place 1 drop into both eyes daily as needed (dry eyes). 12/18/16  Yes Weber, Damaris Hippo, PA-C    No Known Allergies  Patient Active Problem List   Diagnosis Date Noted  . Type 2 diabetes mellitus without complication, without long-term current use of insulin ([place]) 10/05/2016  . Elevated LDL  cholesterol level 10/05/2016  . 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 Essential hypertension 12/21/2006  . GERD 12/21/2006    Past Medical History:  Diagnosis Date  . ABUSE, ALCOHOL, IN REMISSION 12/24/2006   Qualifier: Diagnosis of  By: Leward Quan MD, Pamala Hurry    . Elevated LDL cholesterol level 10/05/2016  . Essential hypertension 12/21/2006   Qualifier: Diagnosis of  By: Leward Quan MD, Pamala Hurry    . GERD 12/21/2006   Qualifier: Diagnosis of  By: Leward Quan MD, Pamala Hurry    . HEPATITIS C, CHRONIC VIRAL, W/O HEPATIC COMA 12/24/2006   Qualifier: Diagnosis of  By: Leward Quan MD, Pamala Hurry    . Type 2 diabetes mellitus without complication, without long-term current use of insulin ([place]) 10/05/2016    Past Surgical History:  Procedure Laterality Date  . FOOT SURGERY    . KNEE SURGERY      Social History   Social History  . Marital status: Married    Spouse name: N/A  . Number of children: 8  . Years of education: N/A   Occupational History  . sports maintanence    Social History Main Topics  . Smoking status: Never Smoker  . Smokeless tobacco: Never Used  . Alcohol use No     Comment: once a week - 1/2 pint  . Drug use: No  . Sexual activity: Yes   Other Topics Concern  . 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 Hypertension Mother   . Hypertension Father      Review of Systems  Constitutional: Negative for chills and fever.  HENT: Negative for sore throat.   Eyes: Negative for discharge and redness.  Respiratory: Negative for cough and shortness of breath.   Cardiovascular: Negative for chest pain and palpitations.  Gastrointestinal: Negative for nausea and  vomiting.  Genitourinary: Negative.   Skin: Positive for itching and rash.  Neurological: Negative.  Negative for dizziness and headaches.  All other systems reviewed and are negative.  Vitals:   01/27/17 1433  BP: 134/83  Pulse: 61  Resp: 17  Temp: 98 F (36.7 C)  SpO2: 98%     Physical Exam  Constitutional: He is oriented to person, place, and time. He appears well-developed and well-nourished.  HENT:  Head: Normocephalic.  Eyes: Pupils are equal, round, and reactive to light. EOM are normal.  Neck: Normal range of motion.  Cardiovascular: Normal rate.   Pulmonary/Chest: Effort normal.  Neurological: He is alert and oriented to person, place, and time.  Skin: Skin is warm and dry. Capillary refill takes less than 2 seconds. Rash noted.  Vesicular rash to right T5 dermatome  Psychiatric: He has a normal mood and affect. His behavior is normal.  Vitals reviewed.    ASSESSMENT & PLAN: [name] was seen today for patient thinks he has shingles.  Diagnoses and all orders for this visit:  Herpes zoster without complication  Other orders -     valACYclovir (VALTREX) 1000 MG tablet; Take 1 tablet (1,000 mg total) by mouth 2 (two) times daily.    Patient Instructions       IF you received an x-ray today, you will receive an invoice from [facility]. Please contact [facility] at [phone] with questions or concerns regarding your invoice.   IF you received labwork today, you will receive an invoice from [facility]. Please contact [facility] at [phone] with questions or concerns regarding your invoice.   Our billing staff will not be able to assist you with questions regarding bills from these companies.  You will be contacted with the lab results as soon as they are available. The fastest way to get your results is to activate your My Chart account. Instructions are located on the last page of this paperwork. If you have not heard from [place]  regarding the results in 2 weeks, please contact this office.      Shingles Shingles is an infection that causes a painful skin rash and fluid-filled blisters. Shingles is caused by the same virus that causes chickenpox. Shingles only develops in people who:  Have had chickenpox.  Have gotten the chickenpox vaccine. (This is rare.)  The first symptoms of shingles may be itching, tingling, or pain in an area on your skin. A rash will follow in a few days or weeks. The rash is usually on one side of the body in a bandlike or beltlike pattern. Over time, the rash turns into fluid-filled blisters that break open, scab over, and dry up. Medicines may:  Help you manage  pain.  Help you recover more quickly.  Help to prevent long-term problems.  Follow these instructions at home: Medicines  Take medicines only as told by your doctor.  Apply an anti-itch or numbing cream to the affected area as told by your doctor. Blister and Rash Care  Take a cool bath or put cool compresses on the area of the rash or blisters as told by your doctor. This may help with pain and itching.  Keep your rash covered with a loose bandage (dressing). Wear loose-fitting clothing.  Keep your rash and blisters clean with mild soap and cool water or as told by your doctor.  Check your rash every day for signs of infection. These include redness, swelling, and pain that lasts or gets worse.  Do not pick your blisters.  Do not scratch your rash. General instructions  Rest as told by your doctor.  Keep all follow-up visits as told by your doctor. This is important.  Until your blisters scab over, your infection can cause chickenpox in people who have never had it or been vaccinated against it. To prevent this from happening, avoid touching other people or being around other people, especially: ? Babies. ? Pregnant women. ? Children who have eczema. ? Elderly people who have transplants. ? People who have  chronic illnesses, such as leukemia or AIDS. Contact a doctor if:  Your pain does not get better with medicine.  Your pain does not get better after the rash heals.  Your rash looks infected. Signs of infection include: ? Redness. ? Swelling. ? Pain that lasts or gets worse. Get help right away if:  The rash is on your face or nose.  You have pain in your face, pain around your eye area, or loss of feeling on one side of your face.  You have ear pain or you have ringing in your ear.  You have loss of taste.  Your condition gets worse. This information is not intended to replace advice given to you by your health care provider. Make sure you discuss any questions you have with your health care provider. Document Released: 10/21/2007 Document Revised: 12/29/2015 Document Reviewed: 02/13/2014 Elsevier Interactive Patient Education  2018 Elsevier Inc.      Agustina Caroli, MD [facility]

## 2017-02-02 ENCOUNTER — Encounter: Payer: Self-pay | Admitting: Physician Assistant

## 2017-02-02 ENCOUNTER — Ambulatory Visit (INDEPENDENT_AMBULATORY_CARE_PROVIDER_SITE_OTHER): Payer: BC Managed Care – PPO | Admitting: Physician Assistant

## 2017-02-02 VITALS — BP 113/75 | HR 61 | Temp 98.4°F | Resp 18 | Ht 71.0 in | Wt 181.8 lb

## 2017-02-02 DIAGNOSIS — Z23 Encounter for immunization: Secondary | ICD-10-CM

## 2017-02-02 DIAGNOSIS — I10 Essential (primary) hypertension: Secondary | ICD-10-CM

## 2017-02-02 DIAGNOSIS — E119 Type 2 diabetes mellitus without complications: Secondary | ICD-10-CM | POA: Diagnosis not present

## 2017-02-02 DIAGNOSIS — Z114 Encounter for screening for human immunodeficiency virus [HIV]: Secondary | ICD-10-CM

## 2017-02-02 DIAGNOSIS — E7800 Pure hypercholesterolemia, unspecified: Secondary | ICD-10-CM

## 2017-02-02 MED ORDER — ROSUVASTATIN CALCIUM 20 MG PO TABS: 20.0000 mg | ORAL_TABLET | Freq: Every day | ORAL | 0 refills | Status: DC

## 2017-02-02 MED ORDER — OLMESARTAN-AMLODIPINE-HCTZ 40-10-25 MG PO TABS: 1.0000 | ORAL_TABLET | Freq: Every day | ORAL | 0 refills | Status: DC

## 2017-02-02 MED ORDER — CARVEDILOL 12.5 MG PO TABS: 12.5000 mg | ORAL_TABLET | Freq: Two times a day (BID) | ORAL | 0 refills | Status: DC

## 2017-02-02 NOTE — Patient Instructions (Addendum)
  Please call me with your old PCP name so I can get your medication information regarding colonoscopy and immunizations.   IF you received an x-ray today, you will receive an invoice from [facility]. Please contact [facility] at [phone] with questions or concerns regarding your invoice.   IF you received labwork today, you will receive an invoice from [place]. Please contact [facility] at [phone] with questions or concerns regarding your invoice.   Our billing staff will not be able to assist you with questions regarding bills from these companies.  You will be contacted with the lab results as soon as they are available. The fastest way to get your results is to activate your My Chart account. Instructions are located on the last page of this paperwork. If you have not heard from [place] regarding the results in 2 weeks, please contact this office.

## 2017-02-02 NOTE — Progress Notes (Signed)
Gary Maddox  MRN: [id number] DOB: 05-Oct-1950  PCP: Mancel Bale, PA-C  Chief Complaint  Patient presents with  . Hypertension    follow up     Subjective:  Pt presents to clinic for recheck of his HTN and DM.  He is taking his medications as directed - he does not check his BP or glucose at home -  He does see specialist for his back and goes to PT and his BP is checked there and it has been in a controlled range.  He feels good.  Last eye doctor - unsure -   History is obtained by patient.  Review of Systems  Constitutional: Negative for chills and fever.  Eyes: Negative for visual disturbance.  Respiratory: Negative for cough and shortness of breath.   Cardiovascular: Negative for chest pain, palpitations and leg swelling.  Neurological: Negative for dizziness, light-headedness, numbness and headaches.    Patient Active Problem List   Diagnosis Date Noted  . Type 2 diabetes mellitus without complication, without long-term current use of insulin ([place]) 10/05/2016  . Elevated LDL cholesterol level 10/05/2016  . 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 Essential hypertension 12/21/2006  . GERD 12/21/2006    Current Outpatient Prescriptions on File Prior to Visit  Medication Sig Dispense Refill  . gabapentin (NEURONTIN) 300 MG capsule TAKE 1 CAPSULE BY MOUTH DAILY FOR 1 WEEK, THEN INCREASE TO TWICE DAILY AS TOLERATED  0  . meloxicam (MOBIC) 7.5 MG tablet take 1 tablet by mouth once daily 30 tablet 0  . metFORMIN (GLUCOPHAGE XR) 500 MG 24 hr tablet Take 1 tablet (500 mg total) by mouth daily with breakfast. 90 tablet 0  . methocarbamol (ROBAXIN) 500 MG tablet Take 1 tablet (500 mg total) by mouth 2 (two) times daily. (Patient taking differently: Take 500 mg by mouth every 8 (eight) hours as needed for muscle spasms. ) 20 tablet 0  . sildenafil (REVATIO) 20 MG tablet Take 2-5 tablets by mouth daily as needed  for erectile dysfunction.  5  . silver sulfADIAZINE (SILVADENE) 1 % cream Apply 1 application topically daily. 50 g 1  . tetrahydrozoline (VISINE) 0.05 % ophthalmic solution Place 1 drop into both eyes daily as needed (dry eyes). 15 mL 0   No current facility-administered medications on file prior to visit.     No Known Allergies  Past Medical History:  Diagnosis Date  . ABUSE, ALCOHOL, IN REMISSION 12/24/2006   Qualifier: Diagnosis of  By: Leward Quan MD, Pamala Hurry    . Elevated LDL cholesterol level 10/05/2016  . Essential hypertension 12/21/2006   Qualifier: Diagnosis of  By: Leward Quan MD, Pamala Hurry    . GERD 12/21/2006   Qualifier: Diagnosis of  By: Leward Quan MD, Pamala Hurry    . HEPATITIS C, CHRONIC VIRAL, W/O HEPATIC COMA 12/24/2006   Qualifier: Diagnosis of  By: Leward Quan MD, Pamala Hurry    . 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 Smoking status: Never Smoker  . Smokeless  tobacco: Never Used  . Alcohol use No     Comment: once a week - 1/2 pint   family history includes Hypertension in his father and mother.     Objective:  BP 113/75   Pulse 61   Temp 98.4 F (36.9 C) (Oral)   Resp 18   Ht '5\' 11"'  (1.803 m)   Wt 181 lb 12.8 oz (82.5 kg)   SpO2 98%   BMI 25.36 kg/m  Body mass index is 25.36 kg/m.  Physical Exam  Constitutional: He is oriented to person, place, and time and well-developed, well-nourished, and in no distress.  HENT:  Head: Normocephalic and atraumatic.  Right Ear: External ear normal.  Left Ear: External ear normal.  Eyes: Conjunctivae are normal.  Neck: Normal range of motion.  Cardiovascular: Normal rate,  regular rhythm, normal heart sounds and intact distal pulses.   Pulmonary/Chest: Effort normal and breath sounds normal. He has no wheezes.  Musculoskeletal:       Right lower leg: He exhibits no edema.       Left lower leg: He exhibits no edema.  Neurological: He is alert and oriented to person, place, and time. Gait normal.  Skin: Skin is warm and dry.  Psychiatric: Mood, memory, affect and judgment normal.    Assessment and Plan :  Type 2 diabetes mellitus without complication, without long-term current use of insulin (HCC) - Plan: Hemoglobin A1c, CMP14+EGFR, HM DIABETES FOOT EXAM, Microalbumin, urine  Elevated LDL cholesterol level - Plan: CMP14+EGFR, Lipid panel, rosuvastatin (CRESTOR) 20 MG tablet  Essential hypertension - Plan: CMP14+EGFR, Olmesartan-Amlodipine-HCTZ (TRIBENZOR) 40-10-25 MG TABS, carvedilol (COREG) 12.5 MG tablet  Screening for HIV (human immunodeficiency virus) - Plan: HIV antibody  Flu vaccine need - Plan: Flu Vaccine QUAD 36+ mos IM  Check labs - adjust DM medications if needed.  Continue medications for htn as it is now well controlled.  Check lipid levels and then adjust medication if needed - he will need better control now that he has DM.  Windell Hummingbird PA-C  [facility] 02/02/2017 10:19 AM

## 2017-02-03 LAB — CMP14+EGFR
ALBUMIN: 3.9 g/dL (ref 3.6–4.8)
ALK PHOS: 114 IU/L (ref 39–117)
ALT: 223 IU/L — ABNORMAL HIGH (ref 0–44)
AST: 248 IU/L — ABNORMAL HIGH (ref 0–40)
Albumin/Globulin Ratio: 1.2 (ref 1.2–2.2)
BILIRUBIN TOTAL: 1.1 mg/dL (ref 0.0–1.2)
BUN / CREAT RATIO: 15 (ref 10–24)
BUN: 18 mg/dL (ref 8–27)
CO2: 30 mmol/L — ABNORMAL HIGH (ref 20–29)
Calcium: 9.5 mg/dL (ref 8.6–10.2)
Chloride: 96 mmol/L (ref 96–106)
Creatinine, Ser: 1.22 mg/dL (ref 0.76–1.27)
GFR calc non Af Amer: 62 mL/min/{1.73_m2} (ref >59)
GFR, EST AFRICAN AMERICAN: 72 mL/min/{1.73_m2} (ref >59)
GLOBULIN, TOTAL: 3.3 g/dL (ref 1.5–4.5)
Glucose: 147 mg/dL — ABNORMAL HIGH (ref 65–99)
Potassium: 3.8 mmol/L (ref 3.5–5.2)
SODIUM: 141 mmol/L (ref 134–144)
Total Protein: 7.2 g/dL (ref 6.0–8.5)

## 2017-02-03 LAB — LIPID PANEL
CHOLESTEROL TOTAL: 122 mg/dL (ref 100–199)
Chol/HDL Ratio: 3.7 ratio (ref 0.0–5.0)
HDL: 33 mg/dL — ABNORMAL LOW (ref >39)
LDL CALC: 69 mg/dL (ref 0–99)
Triglycerides: 98 mg/dL (ref 0–149)
VLDL Cholesterol Cal: 20 mg/dL (ref 5–40)

## 2017-02-03 LAB — HEMOGLOBIN A1C
Est. average glucose Bld gHb Est-mCnc: 143 mg/dL
Hgb A1c MFr Bld: 6.6 % — ABNORMAL HIGH (ref 4.8–5.6)

## 2017-02-03 LAB — MICROALBUMIN, URINE: MICROALBUM., U, RANDOM: 42 ug/mL

## 2017-02-03 LAB — HIV ANTIBODY (ROUTINE TESTING W REFLEX): HIV SCREEN 4TH GENERATION: NONREACTIVE

## 2017-02-06 ENCOUNTER — Other Ambulatory Visit: Payer: Self-pay | Admitting: Physician Assistant

## 2017-02-06 DIAGNOSIS — S29011A Strain of muscle and tendon of front wall of thorax, initial encounter: Secondary | ICD-10-CM

## 2017-02-09 ENCOUNTER — Encounter: Payer: Self-pay | Admitting: Radiology

## 2017-02-21 ENCOUNTER — Other Ambulatory Visit: Payer: Self-pay | Admitting: Physician Assistant

## 2017-02-21 DIAGNOSIS — E119 Type 2 diabetes mellitus without complications: Secondary | ICD-10-CM

## 2017-02-22 ENCOUNTER — Other Ambulatory Visit: Payer: Self-pay | Admitting: *Deleted

## 2017-02-22 DIAGNOSIS — E119 Type 2 diabetes mellitus without complications: Secondary | ICD-10-CM

## 2017-02-22 MED ORDER — METFORMIN HCL ER 500 MG PO TB24: 500.0000 mg | ORAL_TABLET | Freq: Every day | ORAL | 0 refills | Status: DC

## 2017-02-22 NOTE — Progress Notes (Unsigned)
Judson Roch,   I sent in Mr Gary Maddox prescription for metformin, looking at the prescription he should have been out in August he states he is taking it correctly then reluctantly admitted he might not be.   I expressed the importance that he take his medication correctly.  And did not tell him to increase yet since he wasn't taken it the way he was suppose to.  Also encourage him to make his 3 month visit.

## 2017-02-22 NOTE — Telephone Encounter (Signed)
Is the patient taking his Metformin as prescribed?  It looks like he would have been out in August?

## 2017-02-22 NOTE — Telephone Encounter (Signed)
Gary Maddox,     I sent in refill for his Metformin.   According to his prescription he should have been out.  When asked if he is taking it correctly he reluctantly admitted he didn't think he was, so I didn't tell him to increase to 2 pills.   I emphasized the importance to take his medication correctly so on his 3 month visit we can know if it is working for him.  Patient voiced understanding

## 2017-03-14 ENCOUNTER — Other Ambulatory Visit: Payer: Self-pay | Admitting: Physician Assistant

## 2017-03-14 DIAGNOSIS — S29011A Strain of muscle and tendon of front wall of thorax, initial encounter: Secondary | ICD-10-CM

## 2017-04-26 ENCOUNTER — Other Ambulatory Visit: Payer: Self-pay | Admitting: Physician Assistant

## 2017-04-26 DIAGNOSIS — S29011A Strain of muscle and tendon of front wall of thorax, initial encounter: Secondary | ICD-10-CM

## 2017-04-27 ENCOUNTER — Encounter: Payer: BC Managed Care – PPO | Admitting: Psychology

## 2017-04-27 ENCOUNTER — Other Ambulatory Visit: Payer: Self-pay | Admitting: Physician Assistant

## 2017-04-27 DIAGNOSIS — I10 Essential (primary) hypertension: Secondary | ICD-10-CM

## 2017-05-03 ENCOUNTER — Other Ambulatory Visit: Payer: Self-pay | Admitting: Physician Assistant

## 2017-05-03 DIAGNOSIS — S29011A Strain of muscle and tendon of front wall of thorax, initial encounter: Secondary | ICD-10-CM

## 2017-05-04 NOTE — Telephone Encounter (Signed)
Please call this patient. Rx authorized. He needs to schedule follow-up with Judson Roch for diabetes before he runs out.  Meds ordered this encounter  Medications  . meloxicam (MOBIC) 7.5 MG tablet    Sig: take 1 tablet by mouth once daily    Dispense:  30 tablet    Refill:  0    Patient needs office visit for additional fills.

## 2017-05-13 ENCOUNTER — Encounter: Payer: BC Managed Care – PPO | Admitting: Psychology

## 2017-05-13 ENCOUNTER — Encounter (HOSPITAL_COMMUNITY): Payer: Self-pay | Admitting: Emergency Medicine

## 2017-05-13 ENCOUNTER — Ambulatory Visit (HOSPITAL_COMMUNITY)
Admission: EM | Admit: 2017-05-13 | Discharge: 2017-05-13 | Disposition: A | Discharge status: Home / Self Care | Payer: BC Managed Care – PPO | Attending: Emergency Medicine | Admitting: Emergency Medicine

## 2017-05-13 ENCOUNTER — Other Ambulatory Visit: Payer: Self-pay

## 2017-05-13 DIAGNOSIS — K1379 Other lesions of oral mucosa: Secondary | ICD-10-CM

## 2017-05-13 MED ORDER — AMOXICILLIN 500 MG PO CAPS: 1000.0000 mg | ORAL_CAPSULE | Freq: Two times a day (BID) | ORAL | 0 refills | Status: DC

## 2017-05-13 NOTE — ED Provider Notes (Signed)
[facility]    CSN: [id number] Arrival date & time: 05/13/17  1447     History   Chief Complaint Chief Complaint  Patient presents with  . Dental Pain    HPI Anais Koenen is a 66 y.o. male.   66 year old male states that he has a partial that is placed to the right lower gumline. He states he thinks he got something under it which caused some swelling and discomfort. He has removed the partial and in this helps the discomfort. When he replaces the partial it does not fit well because of underlying swelling. He plans to see his dentist on opening day January 2.      Past Medical History:  Diagnosis Date  . ABUSE, ALCOHOL, IN REMISSION 12/24/2006   Qualifier: Diagnosis of  By: Leward Quan MD, Pamala Hurry    . Elevated LDL cholesterol level 10/05/2016  . Essential hypertension 12/21/2006   Qualifier: Diagnosis of  By: Leward Quan MD, Pamala Hurry    . GERD 12/21/2006   Qualifier: Diagnosis of  By: Leward Quan MD, Pamala Hurry    . HEPATITIS C, CHRONIC VIRAL, W/O HEPATIC COMA 12/24/2006   Qualifier: Diagnosis of  By: Leward Quan MD, Pamala Hurry    . Type 2 diabetes mellitus without complication, without long-term current use of insulin ([place]) 10/05/2016    Patient Active Problem List   Diagnosis Date Noted  . Type 2 diabetes mellitus without complication, without long-term current use of insulin ([place]) 10/05/2016  . Elevated LDL cholesterol level 10/05/2016  . 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 Essential hypertension 12/21/2006  . GERD 12/21/2006    Past Surgical History:  Procedure Laterality Date  . FOOT SURGERY    . KNEE SURGERY         Home Medications    Prior to Admission medications   Medication Sig Start Date End Date Taking? Authorizing Provider  carvedilol (COREG) 12.5 MG tablet take 1 tablet by mouth twice a day with meals 04/29/17  Yes Weber, Sarah L, PA-C  gabapentin (NEURONTIN) 300 MG capsule TAKE 1  CAPSULE BY MOUTH DAILY FOR 1 WEEK, THEN INCREASE TO TWICE DAILY AS TOLERATED 12/15/16  Yes [provider]  meloxicam (MOBIC) 7.5 MG tablet take 1 tablet by mouth once daily 05/04/17  Yes Jeffery, Chelle, PA-C  metFORMIN (GLUCOPHAGE XR) 500 MG 24 hr tablet Take 1 tablet (500 mg total) by mouth daily with breakfast. 02/22/17  Yes Weber, Sarah L, PA-C  methocarbamol (ROBAXIN) 500 MG tablet Take 1 tablet (500 mg total) by mouth 2 (two) times daily. Patient taking differently: Take 500 mg by mouth every 8 (eight) hours as needed for muscle spasms.  08/12/16  Yes Etta Quill, NP  Olmesartan-Amlodipine-HCTZ (TRIBENZOR) 40-10-25 MG TABS Take 1 tablet by mouth daily. 02/02/17  Yes Weber, Sarah L, PA-C  rosuvastatin (CRESTOR) 20 MG tablet Take 1 tablet (20 mg total) by mouth daily. 02/02/17  Yes Weber, Sarah L, PA-C  sildenafil (REVATIO) 20 MG tablet Take 2-5 tablets by mouth daily as needed for erectile dysfunction. 08/17/16  Yes [provider]  silver sulfADIAZINE (SILVADENE) 1 % cream Apply 1 application topically daily. 11/30/16  Yes Weber, Damaris Hippo, PA-C  tetrahydrozoline (VISINE) 0.05 % ophthalmic solution Place 1 drop into both eyes daily as needed (dry eyes). 12/18/16  Yes Weber, Sarah L, PA-C  amoxicillin (AMOXIL) 500 MG capsule Take 2 capsules (1,000 mg total) by mouth 2 (two) times daily. 05/13/17  Janne Napoleon, NP    Family History Family History  Problem Relation Age of Onset  . Hypertension Mother   . Hypertension Father     Social History Social History   Tobacco Use  . Smoking status: Never Smoker  . Smokeless tobacco: Never Used  Substance Use Topics  . Alcohol use: No    Comment: once a week - 1/2 pint  . Drug use: No     Allergies   Patient has no known allergies.   Review of Systems Review of Systems  Constitutional: Negative.   HENT: Positive for dental problem.   All other systems reviewed and are negative.    Physical Exam Triage Vital Signs ED  Triage Vitals [05/13/17 1522]  Enc Vitals Group     BP 129/82     Pulse Rate 68     Resp      Temp 98.3 F (36.8 C)     Temp Source Oral     SpO2 99 %     Weight      Height      Head Circumference      Peak Flow      Pain Score 8     Pain Loc      Pain Edu?      Excl. in [place]?    No data found.  Updated Vital Signs BP 129/82 (BP Location: Left Arm)   Pulse 68   Temp 98.3 F (36.8 C) (Oral)   SpO2 99%   Visual Acuity Right Eye Distance:   Left Eye Distance:   Bilateral Distance:    Right Eye Near:   Left Eye Near:    Bilateral Near:     Physical Exam  Constitutional: He is oriented to person, place, and time. He appears well-developed and well-nourished. No distress.  HENT:  Minor erythema of the right gingival line with there are no teeth. No appreciable swelling. Mildly tender. No obvious abscess.  Eyes: EOM are normal.  Neck: Neck supple.  Cardiovascular: Normal rate.  Pulmonary/Chest: Effort normal. No respiratory distress.  Musculoskeletal: He exhibits no edema.  Neurological: He is alert and oriented to person, place, and time. He exhibits normal muscle tone.  Skin: Skin is warm and dry.  Psychiatric: He has a normal mood and affect.  Nursing note and vitals reviewed.    UC Treatments / Results  Labs (all labs ordered are listed, but only abnormal results are displayed) Labs Reviewed - No data to display  EKG  EKG Interpretation None       Radiology No results found.  Procedures Procedures (including critical care time)  Medications Ordered in UC Medications - No data to display   Initial Impression / Assessment and Plan / UC Course  I have reviewed the triage vital signs and the nursing notes.  Pertinent labs & imaging results that were available during my care of the patient were reviewed by me and considered in my medical decision making (see chart for details).    Patient states he does not want anything for pain systemic or  topical.   Final Clinical Impressions(s) / UC Diagnoses   Final diagnoses:  Acute pain of mouth    ED Discharge Orders        Ordered    amoxicillin (AMOXIL) 500 MG capsule  2 times daily     05/13/17 1659       Controlled Substance Prescriptions [place] Controlled Substance Registry consulted? Not Applicable   Janne Napoleon,  NP 05/13/17 1702

## 2017-05-13 NOTE — Discharge Instructions (Signed)
Take the antibiotic as directed. See your dentist on January 2.

## 2017-05-13 NOTE — ED Triage Notes (Signed)
Pt reports an issue with his right lower partial.  His dentist will not be open until January 2.

## 2017-05-21 ENCOUNTER — Ambulatory Visit: Payer: BC Managed Care – PPO | Admitting: Neurology

## 2017-05-24 ENCOUNTER — Encounter: Payer: BC Managed Care – PPO | Admitting: Psychology

## 2017-05-24 ENCOUNTER — Other Ambulatory Visit: Payer: Self-pay | Admitting: Physician Assistant

## 2017-05-24 DIAGNOSIS — E119 Type 2 diabetes mellitus without complications: Secondary | ICD-10-CM

## 2017-05-25 ENCOUNTER — Other Ambulatory Visit: Payer: Self-pay | Admitting: Physician Assistant

## 2017-05-25 DIAGNOSIS — S29011A Strain of muscle and tendon of front wall of thorax, initial encounter: Secondary | ICD-10-CM

## 2017-06-09 ENCOUNTER — Other Ambulatory Visit: Payer: Self-pay | Admitting: Physician Assistant

## 2017-06-09 DIAGNOSIS — I10 Essential (primary) hypertension: Secondary | ICD-10-CM

## 2017-06-11 ENCOUNTER — Telehealth: Payer: Self-pay | Admitting: Physician Assistant

## 2017-06-11 NOTE — Telephone Encounter (Signed)
Pt requesting refill for Coreg. Needs office visit. LOV 02/02/17 Pharmacy  [facility] on [address].

## 2017-06-11 NOTE — Telephone Encounter (Signed)
Copied from [place] [phone]. Topic: Quick Communication - See Telephone Encounter >> Jun 11, 2017  9:15 AM Bea Graff, NT wrote: CRM for notification. See Telephone encounter for: Pt needing a refill of  carvedilol (COREG). Uses [facility] on [address].  06/11/17.

## 2017-06-14 ENCOUNTER — Telehealth: Payer: Self-pay | Admitting: Physician Assistant

## 2017-06-14 ENCOUNTER — Other Ambulatory Visit: Payer: Self-pay | Admitting: Physician Assistant

## 2017-06-14 DIAGNOSIS — S29011A Strain of muscle and tendon of front wall of thorax, initial encounter: Secondary | ICD-10-CM

## 2017-06-14 DIAGNOSIS — I10 Essential (primary) hypertension: Secondary | ICD-10-CM

## 2017-06-14 MED ORDER — MELOXICAM 7.5 MG PO TABS: 7.5000 mg | ORAL_TABLET | Freq: Every day | ORAL | 0 refills | Status: DC

## 2017-06-14 NOTE — Telephone Encounter (Signed)
Copied from [place]. Topic: Quick Communication - See Telephone Encounter >> Jun 14, 2017  5:18 PM Conception Chancy, NT wrote: CRM for notification. See Telephone encounter for:  06/14/17.  Pt is calling needing a prescription refill for Carvedilol 12.5mg  sent into [facility].

## 2017-06-14 NOTE — Telephone Encounter (Signed)
Refill request for Meloxicam; last office visit on 02/02/17; last CMP 02/02/17/  Phone call to pt. re: refill request.  Advised pt. he is overdue for an office visit. The pt. stated he did not realize that.  Appt. scheduled with Windell Hummingbird, PA., on 06/25/17 @ 9:20 AM.  Agreed with plan.  Advised will give him a refill on Meloxicam; #15, no refills, and that he must keep the 2/8 appt. for further refills.  Also, advised he should inform the provider of other medications he needs refilled, at this next appt.  Verb. understanding.

## 2017-06-14 NOTE — Telephone Encounter (Signed)
Copied from [place]. Topic: Quick Communication - Rx Refill/Question >> Jun 14, 2017  9:00 AM Bea Graff, NT wrote: Medication: meloxicam [facility])    Has the patient contacted their pharmacy? Yes.     (Agent: If no, request that the patient contact the pharmacy for the refill.)   Preferred Pharmacy (with phone number or street name): [facility] on [address]. Pt says pharmacy states they never received the refill on 05/29/17   Agent: Please be advised that RX refills may take up to 3 business days. We ask that you follow-up with your pharmacy.

## 2017-06-25 ENCOUNTER — Ambulatory Visit: Payer: BC Managed Care – PPO | Admitting: Physician Assistant

## 2017-06-25 ENCOUNTER — Encounter: Payer: Self-pay | Admitting: Physician Assistant

## 2017-06-25 ENCOUNTER — Other Ambulatory Visit: Payer: Self-pay

## 2017-06-25 DIAGNOSIS — R413 Other amnesia: Secondary | ICD-10-CM | POA: Insufficient documentation

## 2017-06-25 DIAGNOSIS — E7800 Pure hypercholesterolemia, unspecified: Secondary | ICD-10-CM

## 2017-06-25 DIAGNOSIS — G8929 Other chronic pain: Secondary | ICD-10-CM | POA: Diagnosis not present

## 2017-06-25 DIAGNOSIS — G3184 Mild cognitive impairment, so stated: Secondary | ICD-10-CM | POA: Insufficient documentation

## 2017-06-25 DIAGNOSIS — M545 Low back pain: Secondary | ICD-10-CM

## 2017-06-25 DIAGNOSIS — M549 Dorsalgia, unspecified: Secondary | ICD-10-CM

## 2017-06-25 DIAGNOSIS — E119 Type 2 diabetes mellitus without complications: Secondary | ICD-10-CM

## 2017-06-25 MED ORDER — ROSUVASTATIN CALCIUM 20 MG PO TABS: 20.0000 mg | ORAL_TABLET | Freq: Every day | ORAL | 0 refills | Status: DC

## 2017-06-25 MED ORDER — METFORMIN HCL ER 500 MG PO TB24: 500.0000 mg | ORAL_TABLET | Freq: Every day | ORAL | 0 refills | Status: DC

## 2017-06-25 NOTE — Patient Instructions (Signed)
     IF you received an x-ray today, you will receive an invoice from [facility]. Please contact [facility] at [phone] with questions or concerns regarding your invoice.   IF you received labwork today, you will receive an invoice from [facility]. Please contact [facility] at [phone] with questions or concerns regarding your invoice.   Our billing staff will not be able to assist you with questions regarding bills from these companies.  You will be contacted with the lab results as soon as they are available. The fastest way to get your results is to activate your My Chart account. Instructions are located on the last page of this paperwork. If you have not heard from us regarding the results in 2 weeks, please contact this office.     

## 2017-06-25 NOTE — Progress Notes (Signed)
Gary Maddox  MRN: [id number] DOB: 1951/03/25  PCP: Mancel Bale, PA-C  Chief Complaint  Patient presents with  . Medication Refill    all meds     Subjective:  Pt presents to clinic for medication refills. He has not been using his cholesterol medications or his DM medications.  He feels good other than the pain in his back and left chest wall which he sees an orthopedist for.  History is obtained by patient though he is a poor historian due to memory issues.  Review of Systems  Constitutional: Negative for chills and fever.  Eyes: Negative for visual disturbance.  Respiratory: Negative for cough and shortness of breath.   Cardiovascular: Negative for chest pain, palpitations and leg swelling.  Musculoskeletal: Positive for back pain.       Chest wall pain  Neurological: Negative for dizziness, light-headedness, numbness and headaches.    Patient Active Problem List   Diagnosis Date Noted  . Type 2 diabetes mellitus without complication, without long-term current use of insulin ([place]) 10/05/2016  . Elevated LDL cholesterol level 10/05/2016  . 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 Essential hypertension 12/21/2006  . GERD 12/21/2006    Current Outpatient Medications on File Prior to Visit  Medication Sig Dispense Refill  . carvedilol (COREG) 12.5 MG tablet take 1 tablet by mouth twice a day WITH MEALS 60 tablet 0  . gabapentin (NEURONTIN) 300 MG capsule TAKE 1 CAPSULE BY MOUTH DAILY FOR 1 WEEK, THEN INCREASE TO TWICE DAILY AS TOLERATED  0  . meloxicam (MOBIC) 7.5 MG tablet Take 1 tablet (7.5 mg total) by mouth daily. PT. MUST KEEP OFFICE APPT. 2/8 FOR FURTHER REFILLS. 15 tablet 0  . Olmesartan-Amlodipine-HCTZ (TRIBENZOR) 40-10-25 MG TABS Take 1 tablet by mouth daily. 90 tablet 0  . sildenafil (REVATIO) 20 MG tablet Take 2-5 tablets by mouth daily as needed for erectile dysfunction.  5  . silver  sulfADIAZINE (SILVADENE) 1 % cream Apply 1 application topically daily. 50 g 1  . tetrahydrozoline (VISINE) 0.05 % ophthalmic solution Place 1 drop into both eyes daily as needed (dry eyes). 15 mL 0   No current facility-administered medications on file prior to visit.     No Known Allergies  Past Medical History:  Diagnosis Date  . ABUSE, ALCOHOL, IN REMISSION 12/24/2006   Qualifier: Diagnosis of  By: Leward Quan MD, Pamala Hurry    . Elevated LDL cholesterol level 10/05/2016  . Essential hypertension 12/21/2006   Qualifier: Diagnosis of  By: Leward Quan MD, Pamala Hurry    . GERD 12/21/2006   Qualifier: Diagnosis of  By: Leward Quan MD, Pamala Hurry    . HEPATITIS C, CHRONIC VIRAL, W/O HEPATIC COMA 12/24/2006   Qualifier: Diagnosis of  By: Leward Quan MD, Pamala Hurry    . 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 Smoking status: Never Smoker  . Smokeless tobacco: Never Used  Substance Use Topics  . Alcohol use: No    Comment: once a week -  1/2 pint  . Drug use: No   family history includes Hypertension in his father and mother.     Objective:  BP 100/60   Pulse 64   Temp 98.8 F (37.1 C) (Oral)   Resp 18   Ht 5\' 11"  (1.803 m)   Wt 176 lb 3.2 oz (79.9 kg)   SpO2 97%   BMI 24.57 kg/m  Body mass index is 24.57 kg/m.  Physical Exam  Constitutional: He is oriented to person, place, and time and well-developed, well-nourished, and in no distress.  HENT:  Head: Normocephalic and atraumatic.  Right Ear: External ear normal.  Left Ear: External ear normal.  Eyes: Conjunctivae are normal.  Neck: Normal range of motion.  Cardiovascular: Normal rate, regular  rhythm, normal heart sounds and intact distal pulses.  Pulmonary/Chest: Effort normal and breath sounds normal. He has no wheezes.    Musculoskeletal:       Right lower leg: He exhibits no edema.       Left lower leg: He exhibits no edema.  Neurological: He is alert and oriented to person, place, and time. Gait normal.  Skin: Skin is warm and dry.  Psychiatric: Mood, memory, affect and judgment normal.    Assessment and Plan :  Elevated LDL cholesterol level - Plan: rosuvastatin (CRESTOR) 20 MG tablet  Type 2 diabetes mellitus without complication, without long-term current use of insulin (H[facility]) - Plan: metFORMIN (GLUCOPHAGE-XR) 500 MG 24 hr tablet   Pt is currently only taking his HTN medications - we discussed again today the other medications that he should be taking - I again labeled all his bottles that he brought with him and on his AVS highlighted the medication for cholesterol and DM that he should be taking.  I did not do labs today because he has not been on his DM/cholesterol medication.  Windell Hummingbird PA-C  [facility] 06/25/2017 10:13 AM

## 2017-07-03 ENCOUNTER — Other Ambulatory Visit: Payer: Self-pay

## 2017-07-03 ENCOUNTER — Encounter (HOSPITAL_COMMUNITY): Payer: Self-pay | Admitting: *Deleted

## 2017-07-03 ENCOUNTER — Emergency Department (HOSPITAL_COMMUNITY)
Admission: EM | Admit: 2017-07-03 | Discharge: 2017-07-03 | Disposition: A | Discharge status: Home / Self Care | Payer: BC Managed Care – PPO | Attending: Emergency Medicine | Admitting: Emergency Medicine

## 2017-07-03 ENCOUNTER — Emergency Department (HOSPITAL_COMMUNITY): Payer: BC Managed Care – PPO

## 2017-07-03 DIAGNOSIS — Z7984 Long term (current) use of oral hypoglycemic drugs: Secondary | ICD-10-CM | POA: Insufficient documentation

## 2017-07-03 DIAGNOSIS — I10 Essential (primary) hypertension: Secondary | ICD-10-CM | POA: Diagnosis not present

## 2017-07-03 DIAGNOSIS — R05 Cough: Secondary | ICD-10-CM | POA: Diagnosis present

## 2017-07-03 DIAGNOSIS — E119 Type 2 diabetes mellitus without complications: Secondary | ICD-10-CM | POA: Diagnosis not present

## 2017-07-03 DIAGNOSIS — Z79899 Other long term (current) drug therapy: Secondary | ICD-10-CM | POA: Diagnosis not present

## 2017-07-03 DIAGNOSIS — J0100 Acute maxillary sinusitis, unspecified: Secondary | ICD-10-CM

## 2017-07-03 MED ORDER — AMOXICILLIN-POT CLAVULANATE 875-125 MG PO TABS: 1.0000 | ORAL_TABLET | Freq: Two times a day (BID) | ORAL | 0 refills | Status: DC

## 2017-07-03 MED ORDER — TRIAMCINOLONE ACETONIDE 55 MCG/ACT NA AERO: 2.0000 | INHALATION_SPRAY | Freq: Every day | NASAL | 12 refills | Status: DC

## 2017-07-03 NOTE — ED Triage Notes (Signed)
Pt reports several days of cough and flu like symptoms. No relief with otc meds. States that his cough is productive with brown sputum. Today has headache and chills, denies bodyaches.

## 2017-07-03 NOTE — ED Provider Notes (Signed)
[facility] EMERGENCY DEPARTMENT Provider Note   CSN: [id number] Arrival date & time: 07/03/17  1731     History   Chief Complaint Chief Complaint  Patient presents with  . Cough    HPI Gary Maddox is a 67 y.o. male.  67 year old male presents with cough congestion and sinus pressure for 2.5 weeks.  No fever or chills.  No vomiting or diarrhea.  Has had lots of postnasal drip.  Complains of mid maxillofacial pain described as pressure and not responsive to over-the-counter medication.  Denies any headache or photophobia.  No neck pain or discomfort.  No rashes noted.      Past Medical History:  Diagnosis Date  . ABUSE, ALCOHOL, IN REMISSION 12/24/2006   Qualifier: Diagnosis of  By: Leward Quan MD, Pamala Hurry    . Elevated LDL cholesterol level 10/05/2016  . Essential hypertension 12/21/2006   Qualifier: Diagnosis of  By: Leward Quan MD, Pamala Hurry    . GERD 12/21/2006   Qualifier: Diagnosis of  By: Leward Quan MD, Pamala Hurry    . HEPATITIS C, CHRONIC VIRAL, W/O HEPATIC COMA 12/24/2006   Qualifier: Diagnosis of  By: Leward Quan MD, [facility]      Patient Active Problem List   Diagnosis Date Noted  . Memory deficit 06/25/2017  . Chronic back pain 06/25/2017  . Type 2 diabetes mellitus without complication, without long-term current use of insulin ([place]) 10/05/2016  . Elevated LDL cholesterol level 10/05/2016  . 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 Essential hypertension 12/21/2006  . GERD 12/21/2006    Past Surgical History:  Procedure Laterality Date  . FOOT SURGERY    . KNEE SURGERY         Home Medications    Prior to Admission medications   Medication Sig Start Date End Date Taking? Authorizing Provider  carvedilol (COREG) 12.5 MG tablet take 1 tablet by mouth twice a day WITH MEALS 06/14/17   Weber, Sarah L, PA-C  gabapentin (NEURONTIN) 300 MG capsule TAKE 1 CAPSULE BY MOUTH DAILY FOR 1 WEEK,  THEN INCREASE TO TWICE DAILY AS TOLERATED 12/15/16   [provider]  meloxicam (MOBIC) 7.5 MG tablet Take 1 tablet (7.5 mg total) by mouth daily. PT. MUST KEEP OFFICE APPT. 2/8 FOR FURTHER REFILLS. 06/14/17   Gale Journey, Damaris Hippo, PA-C  metFORMIN (GLUCOPHAGE-XR) 500 MG 24 hr tablet Take 1 tablet (500 mg total) by mouth daily with breakfast. 06/25/17   Gale Journey, Damaris Hippo, PA-C  Olmesartan-Amlodipine-HCTZ (TRIBENZOR) 40-10-25 MG TABS Take 1 tablet by mouth daily. 02/02/17   Weber, Damaris Hippo, PA-C  rosuvastatin (CRESTOR) 20 MG tablet Take 1 tablet (20 mg total) by mouth daily. 06/25/17   Weber, Damaris Hippo, PA-C  sildenafil (REVATIO) 20 MG tablet Take 2-5 tablets by mouth daily as needed for erectile dysfunction. 08/17/16   [provider]  silver sulfADIAZINE (SILVADENE) 1 % cream Apply 1 application topically daily. 11/30/16   Weber, Damaris Hippo, PA-C  tetrahydrozoline (VISINE) 0.05 % ophthalmic solution Place 1 drop into both eyes daily as needed (dry eyes). 12/18/16   Gale Journey, Damaris Hippo, PA-C    Family History Family History  Problem Relation Age of Onset  . Hypertension Mother   . Hypertension Father     Social History Social History   Tobacco Use  . Smoking status: Never Smoker  . Smokeless tobacco: Never Used  Substance Use Topics  . Alcohol use: No    Comment: once a week -  1/2 pint  . Drug use: No     Allergies   Patient has no known allergies.   Review of Systems Review of Systems  All other systems reviewed and are negative.    Physical Exam Updated Vital Signs BP 113/84 (BP Location: Right Arm)   Pulse 71   Temp 99.8 F (37.7 C) (Oral)   Resp 18   SpO2 97%   Physical Exam  Constitutional: He is oriented to person, place, and time. He appears well-developed and well-nourished.  Non-toxic appearance. No distress.  HENT:  Head: Normocephalic and atraumatic.    Eyes: Conjunctivae, EOM and lids are normal. Pupils are equal, round, and reactive to light.  Neck: Normal range  of motion. Neck supple. No tracheal deviation present. No thyroid mass present.  Cardiovascular: Normal rate, regular rhythm and normal heart sounds. Exam reveals no gallop.  No murmur heard. Pulmonary/Chest: Effort normal and breath sounds normal. No stridor. No respiratory distress. He has no decreased breath sounds. He has no wheezes. He has no rhonchi. He has no rales.  Abdominal: Soft. Normal appearance and bowel sounds are normal. He exhibits no distension. There is no tenderness. There is no rebound and no CVA tenderness.  Musculoskeletal: Normal range of motion. He exhibits no edema or tenderness.  Neurological: He is alert and oriented to person, place, and time. He has normal strength. No cranial nerve deficit or sensory deficit. GCS eye subscore is 4. GCS verbal subscore is 5. GCS motor subscore is 6.  Skin: Skin is warm and dry. No abrasion and no rash noted.  Psychiatric: He has a normal mood and affect. His speech is normal and behavior is normal.  Nursing note and vitals reviewed.    ED Treatments / Results  Labs (all labs ordered are listed, but only abnormal results are displayed) Labs Reviewed - No data to display  EKG  EKG Interpretation None       Radiology Dg Chest 2 View  Result Date: 07/03/2017 CLINICAL DATA:  Several days of cough and flu-like symptoms. Productive cough with brown sputum. Headache and chills. EXAM: CHEST  2 VIEW COMPARISON:  Chest x-ray dated 09/20/2004. FINDINGS: Heart size and mediastinal contours are normal. Lungs are clear. No pleural effusion or pneumothorax seen. Osseous and soft tissue structures about the chest are unremarkable. IMPRESSION: No active cardiopulmonary disease.  No evidence of pneumonia. Electronically Signed   By: Franki Cabot M.D.   On: 07/03/2017 19:31    Procedures Procedures (including critical care time)  Medications Ordered in ED Medications - No data to display   Initial Impression / Assessment and Plan / ED  Course  I have reviewed the triage vital signs and the nursing notes.  Pertinent labs & imaging results that were available during my care of the patient were reviewed by me and considered in my medical decision making (see chart for details).     Chest x-ray without acute findings.  Lungs are clear here.  Does have some tenderness over his maxillary sinuses.  Suspect sinusitis and will place on antibiotics.  Final Clinical Impressions(s) / ED Diagnoses   Final diagnoses:  None    ED Discharge Orders    None       Lacretia Leigh, MD 07/03/17 2154

## 2017-07-08 ENCOUNTER — Other Ambulatory Visit: Payer: Self-pay | Admitting: Physician Assistant

## 2017-07-08 DIAGNOSIS — I10 Essential (primary) hypertension: Secondary | ICD-10-CM

## 2017-07-20 ENCOUNTER — Other Ambulatory Visit: Payer: Self-pay | Admitting: Physician Assistant

## 2017-07-20 DIAGNOSIS — S29011A Strain of muscle and tendon of front wall of thorax, initial encounter: Secondary | ICD-10-CM

## 2017-07-21 NOTE — Telephone Encounter (Signed)
LOV 06/25/2017 with Windell Hummingbird / Refill request for Meloxicam /

## 2017-07-28 ENCOUNTER — Telehealth: Payer: Self-pay | Admitting: Physician Assistant

## 2017-07-28 NOTE — Telephone Encounter (Signed)
Copied from [place] [phone]. Topic: Quick Communication - Rx Refill/Question >> Jul 28, 2017  4:30 PM Ahmed Prima L wrote: Medication:  sildenafil (REVATIO) 20 MG tablet    Has the patient contacted their pharmacy? yes   (Agent: If no, request that the patient contact the pharmacy for the refill.)   Preferred Pharmacy (with phone number or street name): [facility]    Agent: Please be advised that RX refills may take up to 3 business days. We ask that you follow-up with your pharmacy.

## 2017-07-29 NOTE — Telephone Encounter (Signed)
Refill request on Sildenafil / Historical provider Last office visit: 06/25/17 PCP:  Windell Hummingbird, PA Pharmacy:[facility]

## 2017-07-31 ENCOUNTER — Other Ambulatory Visit: Payer: Self-pay | Admitting: Physician Assistant

## 2017-07-31 DIAGNOSIS — S29011A Strain of muscle and tendon of front wall of thorax, initial encounter: Secondary | ICD-10-CM

## 2017-08-02 ENCOUNTER — Other Ambulatory Visit: Payer: Self-pay | Admitting: Physician Assistant

## 2017-08-02 DIAGNOSIS — S29011A Strain of muscle and tendon of front wall of thorax, initial encounter: Secondary | ICD-10-CM

## 2017-08-03 MED ORDER — SILDENAFIL CITRATE 20 MG PO TABS: 40.0000 mg | ORAL_TABLET | Freq: Every day | ORAL | 5 refills | Status: DC | PRN

## 2017-08-04 ENCOUNTER — Other Ambulatory Visit: Payer: Self-pay | Admitting: Physician Assistant

## 2017-08-04 DIAGNOSIS — S29011A Strain of muscle and tendon of front wall of thorax, initial encounter: Secondary | ICD-10-CM

## 2017-08-04 NOTE — Telephone Encounter (Addendum)
Prescription cancelled at [facility] on [address].   Phone call to [facility], spoke with Estill Bamberg. Sildenafil 20mg  called in.   Phone call to patient. Patient notified of the above, is appreciative. No further questions.

## 2017-08-04 NOTE — Telephone Encounter (Signed)
Pt  Needs for this rx to go to  family pharmacy - he gets a better price with his insurance

## 2017-08-04 NOTE — Telephone Encounter (Signed)
Copied from [place] [id number]. Topic: Quick Communication - Rx Refill/Question >> Aug 04, 2017  3:43 PM Waylan Rocher, Lumin L wrote: Medication: meloxicam (MOBIC) 7.5 MG tablet + tramedol 50mg  Has the patient contacted their pharmacy? Yes.   (Agent: If no, request that the patient contact the pharmacy for the refill.) Preferred Pharmacy (with phone number or street name): [facility] [place] [address] AT [address] [place] [address] Phone: [phone] Fax: [phone] Agent: Please be advised that RX refills may take up to 3 business days. We ask that you follow-up with your pharmacy.

## 2017-08-05 NOTE — Telephone Encounter (Signed)
Mobic pended Don't see where he is on Tramadol  LOV 06/25/17 with Windell Hummingbird  [place], [place] [employer].

## 2017-08-07 ENCOUNTER — Other Ambulatory Visit: Payer: Self-pay | Admitting: Physician Assistant

## 2017-08-07 DIAGNOSIS — I10 Essential (primary) hypertension: Secondary | ICD-10-CM

## 2017-08-07 DIAGNOSIS — S29011A Strain of muscle and tendon of front wall of thorax, initial encounter: Secondary | ICD-10-CM

## 2017-08-07 NOTE — Telephone Encounter (Signed)
This should come from his ortho who Rx these medications.

## 2017-08-09 NOTE — Telephone Encounter (Signed)
Phone call to patient. Spoke with patient's wife [place]. Message from Weber, PA-C relayed to patient's wife, she verbalizes understanding.

## 2017-08-09 NOTE — Telephone Encounter (Signed)
Copied from [place] [id number]. Topic: Quick Communication - Rx Refill/Question >> Aug 09, 2017  9:32 AM Percell Belt A wrote: Pt called in about this refills.  She wants to know the status.  He is stating he needs this meds because his back is in hurting him  Best number [phone]

## 2017-08-09 NOTE — Telephone Encounter (Signed)
Please advise 

## 2017-08-09 NOTE — Telephone Encounter (Signed)
Pt is wanting to know why he only got very few of this medication called in.  Pt is asking for qty of 90 to be sent to [facility].

## 2017-08-10 NOTE — Telephone Encounter (Signed)
I attempted to call him to see how many pills it requires to get results per Lorenz Coaster note so she could call in more pills for him.   No answer.   No voicemail.

## 2017-08-10 NOTE — Telephone Encounter (Signed)
I will fix it - please call him and ask him how many he has to use at a time to get results and I will send in a Rx with more pills.

## 2017-08-10 NOTE — Telephone Encounter (Signed)
Spoke with pt. Refill sent on 3/19 pt going to pick up.

## 2017-08-11 NOTE — Telephone Encounter (Signed)
Refill of Mobic  LOV 06/25/17  S.Weber  [facility] [place], [place]   Pt has appt 09/17/17

## 2017-08-11 NOTE — Telephone Encounter (Signed)
Copied from [place] [phone]. Topic: Quick Communication - Rx Refill/Question >> Aug 11, 2017  1:39 PM Waylan Rocher, Lumin L wrote: Medication: meloxicam (MOBIC) 7.5 MG tablet  Has the patient contacted their pharmacy? Yes.   (Agent: If no, request that the patient contact the pharmacy for the refill.) Preferred Pharmacy (with phone number or street name): [facility] [place] [place] - [address] AT [address] [place] [address] Phone: [phone] Fax: [phone] Agent: Please be advised that RX refills may take up to 3 business days. We ask that you follow-up with your pharmacy.

## 2017-08-13 MED ORDER — MELOXICAM 7.5 MG PO TABS: 7.5000 mg | ORAL_TABLET | Freq: Every day | ORAL | 2 refills | Status: DC

## 2017-08-13 MED ORDER — CARVEDILOL 12.5 MG PO TABS: 12.5000 mg | ORAL_TABLET | Freq: Two times a day (BID) | ORAL | 2 refills | Status: DC

## 2017-08-13 NOTE — Telephone Encounter (Signed)
Done

## 2017-09-09 ENCOUNTER — Other Ambulatory Visit: Payer: Self-pay | Admitting: Physician Assistant

## 2017-09-09 DIAGNOSIS — I10 Essential (primary) hypertension: Secondary | ICD-10-CM

## 2017-09-10 ENCOUNTER — Other Ambulatory Visit: Payer: Self-pay | Admitting: Physician Assistant

## 2017-09-10 DIAGNOSIS — I10 Essential (primary) hypertension: Secondary | ICD-10-CM

## 2017-09-17 ENCOUNTER — Ambulatory Visit: Payer: BC Managed Care – PPO | Admitting: Physician Assistant

## 2017-09-17 ENCOUNTER — Other Ambulatory Visit: Payer: Self-pay

## 2017-09-17 ENCOUNTER — Encounter: Payer: Self-pay | Admitting: Physician Assistant

## 2017-09-17 VITALS — BP 112/70 | HR 78 | Temp 98.0°F | Resp 16 | Ht 70.47 in | Wt 175.0 lb

## 2017-09-17 DIAGNOSIS — E7800 Pure hypercholesterolemia, unspecified: Secondary | ICD-10-CM

## 2017-09-17 DIAGNOSIS — S29011D Strain of muscle and tendon of front wall of thorax, subsequent encounter: Secondary | ICD-10-CM

## 2017-09-17 DIAGNOSIS — G8929 Other chronic pain: Secondary | ICD-10-CM | POA: Diagnosis not present

## 2017-09-17 DIAGNOSIS — I10 Essential (primary) hypertension: Secondary | ICD-10-CM | POA: Diagnosis not present

## 2017-09-17 DIAGNOSIS — M545 Low back pain: Secondary | ICD-10-CM | POA: Diagnosis not present

## 2017-09-17 DIAGNOSIS — E119 Type 2 diabetes mellitus without complications: Secondary | ICD-10-CM | POA: Diagnosis not present

## 2017-09-17 DIAGNOSIS — R413 Other amnesia: Secondary | ICD-10-CM | POA: Diagnosis not present

## 2017-09-17 NOTE — Progress Notes (Signed)
Gary Maddox  MRN: [id number] DOB: 01-01-1951  PCP: Mancel Bale, PA-C  Chief Complaint  Patient presents with  . Hypertension    follow-up     Subjective:  Pt presents to clinic for recheck of medications for DM, elevated cholesterol and HTN.  He has been taking all his medications since his last visit.  He is still having some chest wall pain on the left side.  It hurts when he moves his left arm and when he pushes on his chest.  He does use a cane but he uses his right arm with that.  He takes mobic for his back but it does not help his pain much. This has been going on for at least a year and he has seen PT for this with his WC injury claim. He plans to have surgery on his back - it has yet to be schedule  History is obtained by patient.  Review of Systems  Constitutional: Negative for chills and fever.  Eyes: Negative for visual disturbance.  Respiratory: Negative for cough and shortness of breath.   Cardiovascular: Negative for chest pain, palpitations and leg swelling.  Neurological: Negative for dizziness, light-headedness, numbness and headaches.    Patient Active Problem List   Diagnosis Date Noted  . Memory deficit 06/25/2017  . Chronic back pain 06/25/2017  . Type 2 diabetes mellitus without complication, without long-term current use of insulin ([place]) 10/05/2016  . Elevated LDL cholesterol level 10/05/2016  . 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 Essential hypertension 12/21/2006  . GERD 12/21/2006    Current Outpatient Medications on File Prior to Visit  Medication Sig Dispense Refill  . carvedilol (COREG) 12.5 MG tablet Take 1 tablet (12.5 mg total) by mouth 2 (two) times daily with a meal. 60 tablet 2  . gabapentin (NEURONTIN) 300 MG capsule TAKE 1 CAPSULE BY MOUTH DAILY FOR 1 WEEK, THEN INCREASE TO TWICE DAILY AS TOLERATED  0  . meloxicam (MOBIC) 15 MG tablet TK 1 T PO QD WF  0  .  metFORMIN (GLUCOPHAGE-XR) 500 MG 24 hr tablet Take 1 tablet (500 mg total) by mouth daily with breakfast. 90 tablet 0  . Olmesartan-amLODIPine-HCTZ 40-10-25 MG TABS TAKE 1 TABLET BY MOUTH ONCE DAILY 30 tablet 0  . rosuvastatin (CRESTOR) 20 MG tablet Take 1 tablet (20 mg total) by mouth daily. 90 tablet 0  . sildenafil (REVATIO) 20 MG tablet Take 2-5 tablets (40-100 mg total) by mouth daily as needed. 10 tablet 5  . silver sulfADIAZINE (SILVADENE) 1 % cream Apply 1 application topically daily. 50 g 1  . tetrahydrozoline (VISINE) 0.05 % ophthalmic solution Place 1 drop into both eyes daily as needed (dry eyes). 15 mL 0  . traMADol (ULTRAM) 50 MG tablet Take 50 mg by mouth every 6 (six) hours as needed. for pain  0  . triamcinolone (NASACORT) 55 MCG/ACT AERO nasal inhaler Place 2 sprays into the nose daily. 1 Inhaler 12   No current facility-administered medications on file prior to visit.     No Known Allergies  Past Medical History:  Diagnosis Date  . ABUSE, ALCOHOL, IN REMISSION 12/24/2006   Qualifier: Diagnosis of  By: Leward Quan MD, Pamala Hurry    . Elevated LDL cholesterol level 10/05/2016  . Essential hypertension 12/21/2006   Qualifier: Diagnosis of  By: Leward Quan MD, Pamala Hurry    . GERD 12/21/2006   Qualifier: Diagnosis of  By: Leward Quan MD,  Pamala Hurry    . 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 Smoking status: Never Smoker  . Smokeless tobacco: Never Used  Substance Use Topics  . Alcohol use: No    Comment: once a week - 1/2 pint  . Drug use: No   family history includes Hypertension in his father and mother.     Objective:  BP  112/70   Pulse 78   Temp 98 F (36.7 C) (Oral)   Resp 16   Ht 5' 10.47" (1.79 m)   Wt 175 lb (79.4 kg)   SpO2 97%   BMI 24.77 kg/m  Body mass index is 24.77 kg/m.  Physical Exam  Constitutional: He is oriented to person, place, and time.  HENT:  Head: Normocephalic and atraumatic.  Right Ear: External ear normal.  Left Ear: External ear normal.  Eyes: Conjunctivae are normal.  Neck: Normal range of motion.  Cardiovascular: Normal rate, regular rhythm, normal heart sounds and intact distal pulses.  Pulmonary/Chest: Effort normal and breath sounds normal. He has no wheezes.  Musculoskeletal:       Right shoulder: Normal.       Left shoulder: He exhibits tenderness (over pectorlalis muscle). He exhibits normal range of motion and no spasm.       Right lower leg: He exhibits no edema.       Left lower leg: He exhibits no edema.  Neurological: He is alert and oriented to person, place, and time. He has normal strength. No sensory deficit.  Skin: Skin is warm and dry.  Psychiatric: Judgment normal.    Assessment and Plan :  Elevated LDL cholesterol level -check labs patient states he has been taking his taking his medication for the last 3 months.  Adjust medication if needed.  Type 2 diabetes mellitus without complication, without long-term current use of insulin ([facility]) -patient states he has been taking this medication for the last 3 months.  We will check levels to see if that needs to be adjusted at this time.   Memory deficit - he has a neurologist but has not had follow up - not sure if he had the ordered testing done  Essential hypertension - Plan: Hemoglobin A1c, Lipid panel, CMP14+EGFR well-controlled today on medications.-   Chronic right-sided low back pain, with sciatica presence unspecified -continue to follow-up with Worker's Comp. provider and claim.  Pectoralis muscle strain, subsequent encounter follow-up with Worker's Comp. provider in clinic. -   Gary Maddox  Primary Care at [facility] 09/17/2017 9:28 AM

## 2017-09-17 NOTE — Patient Instructions (Addendum)
  I will call you and send you a letter about what to do with your medications once I get your lab results back.   IF you received an x-ray today, you will receive an invoice from [facility]. Please contact [facility] at [phone] with questions or concerns regarding your invoice.   IF you received labwork today, you will receive an invoice from [facility]. Please contact [facility] at [phone] with questions or concerns regarding your invoice.   Our billing staff will not be able to assist you with questions regarding bills from these companies.  You will be contacted with the lab results as soon as they are available. The fastest way to get your results is to activate your My Chart account. Instructions are located on the last page of this paperwork. If you have not heard from [place] regarding the results in 2 weeks, please contact this office.

## 2017-09-18 LAB — CMP14+EGFR
A/G RATIO: 1.1 — AB (ref 1.2–2.2)
ALBUMIN: 3.6 g/dL (ref 3.6–4.8)
ALK PHOS: 112 IU/L (ref 39–117)
ALT: 143 IU/L — ABNORMAL HIGH (ref 0–44)
AST: 158 IU/L — ABNORMAL HIGH (ref 0–40)
BILIRUBIN TOTAL: 1 mg/dL (ref 0.0–1.2)
BUN / CREAT RATIO: 14 (ref 10–24)
BUN: 18 mg/dL (ref 8–27)
CHLORIDE: 98 mmol/L (ref 96–106)
CO2: 28 mmol/L (ref 20–29)
Calcium: 9 mg/dL (ref 8.6–10.2)
Creatinine, Ser: 1.26 mg/dL (ref 0.76–1.27)
GFR calc non Af Amer: 60 mL/min/{1.73_m2} (ref >59)
GFR, EST AFRICAN AMERICAN: 69 mL/min/{1.73_m2} (ref >59)
Globulin, Total: 3.4 g/dL (ref 1.5–4.5)
Glucose: 115 mg/dL — ABNORMAL HIGH (ref 65–99)
POTASSIUM: 3.7 mmol/L (ref 3.5–5.2)
Sodium: 139 mmol/L (ref 134–144)
TOTAL PROTEIN: 7 g/dL (ref 6.0–8.5)

## 2017-09-18 LAB — HEMOGLOBIN A1C
ESTIMATED AVERAGE GLUCOSE: 137 mg/dL
Hgb A1c MFr Bld: 6.4 % — ABNORMAL HIGH (ref 4.8–5.6)

## 2017-09-18 LAB — LIPID PANEL
CHOLESTEROL TOTAL: 112 mg/dL (ref 100–199)
Chol/HDL Ratio: 4 ratio (ref 0.0–5.0)
HDL: 28 mg/dL — ABNORMAL LOW (ref >39)
LDL CALC: 62 mg/dL (ref 0–99)
Triglycerides: 110 mg/dL (ref 0–149)
VLDL CHOLESTEROL CAL: 22 mg/dL (ref 5–40)

## 2017-09-22 ENCOUNTER — Encounter: Payer: Self-pay | Admitting: *Deleted

## 2017-09-23 ENCOUNTER — Other Ambulatory Visit: Payer: Self-pay | Admitting: Orthopedic Surgery

## 2017-09-28 NOTE — Pre-Procedure Instructions (Signed)
Gary Maddox  09/28/2017      [id number] - [place], [place] - [address] [place] [address] Phone: [phone] Fax: [phone]  [place], [place] - [address] [place] [address] Phone: [phone] Fax: [phone]    Your procedure is scheduled on May 23  Report to [place] at [place].M.  Call this number if you have problems the morning of surgery:  [phone]   Remember:  Do not eat food or drink liquids after midnight.  Continue all medications as directed by your physician except follow these medication instructions before surgery below   Take these medicines the morning of surgery with A SIP OF WATER  carvedilol (COREG)  gabapentin (NEURONTIN)  traMADol (ULTRAM triamcinolone (NASACORT)   7 days prior to surgery STOP taking any Aspirin(unless otherwise instructed by your surgeon), Aleve, Naproxen, Ibuprofen, Motrin, Advil, Goody's, BC's, all herbal medications, fish oil, and all vitamins   WHAT DO I DO ABOUT MY DIABETES MEDICATION?   Marland Kitchen Do not take oral diabetes medicines (pills) the morning of surgery. metFORMIN (GLUCOPHAGE-XR)   How to Manage Your Diabetes Before and After Surgery  Why is it important to control my blood sugar before and after surgery? . Improving blood sugar levels before and after surgery helps healing and can limit problems. . A way of improving blood sugar control is eating a healthy diet by: o  Eating less sugar and carbohydrates o  Increasing activity/exercise o  Talking with your doctor about reaching your blood sugar goals . High blood sugars (greater than 180 mg/dL) can raise your risk of infections and slow your recovery, so you will need to focus on controlling your diabetes during the weeks before surgery. . Make sure that the doctor  who takes care of your diabetes knows about your planned surgery including the date and location.  How do I manage my blood sugar before surgery? . Check your blood sugar at least 4 times a day, starting 2 days before surgery, to make sure that the level is not too high or low. o Check your blood sugar the morning of your surgery when you wake up and every 2 hours until you get to the Short Stay unit. . If your blood sugar is less than 70 mg/dL, you will need to treat for low blood sugar: o Do not take insulin. o Treat a low blood sugar (less than 70 mg/dL) with  cup of clear juice (cranberry or apple), 4 glucose tablets, OR glucose gel. o Recheck blood sugar in 15 minutes after treatment (to make sure it is greater than 70 mg/dL). If your blood sugar is not greater than 70 mg/dL on recheck, call [phone] for further instructions. . Report your blood sugar to the short stay nurse when you get to Short Stay.  . If you are admitted to the hospital after surgery: o Your blood sugar will be checked by the staff and you will probably be given insulin after surgery (instead of oral diabetes medicines) to make sure you have good blood sugar levels. o The goal for blood sugar control after surgery is 80-180 mg/dL.   Do not wear jewelry  Do not wear lotions, powders, or cologne, or deodorant.  Men may shave face and neck.  Do not bring valuables to the hospital.  [facility] is  not responsible for any belongings or valuables.  Contacts, dentures or bridgework may not be worn into surgery.  Leave your suitcase in the car.  After surgery it may be brought to your room.  For patients admitted to the hospital, discharge time will be determined by your treatment team.  Patients discharged the day of surgery will not be allowed to drive home.    Special instructions:   [facility]- Preparing For Surgery  Before surgery, you can play an important role. Because skin is not sterile, your skin needs  to be as free of germs as possible. You can reduce the number of germs on your skin by washing with CHG (chlorahexidine gluconate) Soap before surgery.  CHG is an antiseptic cleaner which kills germs and bonds with the skin to continue killing germs even after washing.  Oral Hygiene is also important to reduce your risk of infection.  Remember - BRUSH YOUR TEETH THE MORNING OF SURGERY  Please do not use if you have an allergy to CHG or antibacterial soaps. If your skin becomes reddened/irritated stop using the CHG.  Do not shave (including legs and underarms) for at least 48 hours prior to first CHG shower. It is OK to shave your face.  Please follow these instructions carefully.   1. Shower the NIGHT BEFORE SURGERY and the MORNING OF SURGERY with CHG.   2. If you chose to wash your hair, wash your hair first as usual with your normal shampoo.  3. After you shampoo, rinse your hair and body thoroughly to remove the shampoo.  4. Use CHG as you would any other liquid soap. You can apply CHG directly to the skin and wash gently with a scrungie or a clean washcloth.   5. Apply the CHG Soap to your body ONLY FROM THE NECK DOWN.  Do not use on open wounds or open sores. Avoid contact with your eyes, ears, mouth and genitals (private parts). Wash Face and genitals (private parts)  with your normal soap.  6. Wash thoroughly, paying special attention to the area where your surgery will be performed.  7. Thoroughly rinse your body with warm water from the neck down.  8. DO NOT shower/wash with your normal soap after using and rinsing off the CHG Soap.  9. Pat yourself dry with a CLEAN TOWEL.  10. Wear CLEAN PAJAMAS to bed the night before surgery, wear comfortable clothes the morning of surgery  11. Place CLEAN SHEETS on your bed the night of your first shower and DO NOT SLEEP WITH PETS.    Day of Surgery:  Do not apply any deodorants/lotions.  Please wear clean clothes to the  hospital/surgery center.   Remember to brush your teeth.      Please read over the following fact sheets that you were given.

## 2017-09-28 NOTE — Progress Notes (Signed)
Called Dr. Laurena Bering office in regards to posted surgery of an anterior Lumbar interbody fusion.  Wanted clarification on if there needs to be a vascular surgeon to open for Dr. Lynann Bologna on 5/23.  Spoke with Butch Penny who will reach out to Dr. Lynann Bologna for clarification

## 2017-09-29 ENCOUNTER — Other Ambulatory Visit: Payer: Self-pay | Admitting: *Deleted

## 2017-09-29 ENCOUNTER — Telehealth: Payer: Self-pay | Admitting: Physician Assistant

## 2017-09-29 ENCOUNTER — Encounter (HOSPITAL_COMMUNITY)
Admission: RE | Admit: 2017-09-29 | Discharge: 2017-09-29 | Disposition: A | Discharge status: Home / Self Care | Payer: No Typology Code available for payment source | Source: Ambulatory Visit | Attending: Orthopedic Surgery | Admitting: Orthopedic Surgery

## 2017-09-29 ENCOUNTER — Encounter (HOSPITAL_COMMUNITY): Payer: Self-pay

## 2017-09-29 ENCOUNTER — Other Ambulatory Visit: Payer: Self-pay

## 2017-09-29 DIAGNOSIS — Z01812 Encounter for preprocedural laboratory examination: Secondary | ICD-10-CM | POA: Diagnosis not present

## 2017-09-29 DIAGNOSIS — R001 Bradycardia, unspecified: Secondary | ICD-10-CM | POA: Insufficient documentation

## 2017-09-29 DIAGNOSIS — Z01810 Encounter for preprocedural cardiovascular examination: Secondary | ICD-10-CM | POA: Insufficient documentation

## 2017-09-29 HISTORY — DX: Type 2 diabetes mellitus without complications: E11.9

## 2017-09-29 LAB — ABO/RH: ABO/RH(D): O POS

## 2017-09-29 LAB — CBC WITH DIFFERENTIAL/PLATELET
Abs Immature Granulocytes: 0 10*3/uL (ref 0.0–0.1)
Basophils Absolute: 0 10*3/uL (ref 0.0–0.1)
Basophils Relative: 1 %
EOS ABS: 0.2 10*3/uL (ref 0.0–0.7)
Eosinophils Relative: 3 %
HEMATOCRIT: 43.8 % (ref 39.0–52.0)
Hemoglobin: 15.5 g/dL (ref 13.0–17.0)
IMMATURE GRANULOCYTES: 0 %
LYMPHS ABS: 2.7 10*3/uL (ref 0.7–4.0)
Lymphocytes Relative: 34 %
MCH: 35.1 pg — AB (ref 26.0–34.0)
MCHC: 35.4 g/dL (ref 30.0–36.0)
MCV: 99.1 fL (ref 78.0–100.0)
MONOS PCT: 10 %
Monocytes Absolute: 0.8 10*3/uL (ref 0.1–1.0)
NEUTROS PCT: 52 %
Neutro Abs: 4.2 10*3/uL (ref 1.7–7.7)
Platelets: 112 10*3/uL — ABNORMAL LOW (ref 150–400)
RBC: 4.42 MIL/uL (ref 4.22–5.81)
RDW: 12.3 % (ref 11.5–15.5)
WBC: 8 10*3/uL (ref 4.0–10.5)

## 2017-09-29 LAB — URINALYSIS, ROUTINE W REFLEX MICROSCOPIC
BILIRUBIN URINE: NEGATIVE
Glucose, UA: 50 mg/dL — AB
HGB URINE DIPSTICK: NEGATIVE
Ketones, ur: 5 mg/dL — AB
LEUKOCYTES UA: NEGATIVE
NITRITE: NEGATIVE
PH: 6 (ref 5.0–8.0)
Protein, ur: 100 mg/dL — AB
SPECIFIC GRAVITY, URINE: 1.024 (ref 1.005–1.030)

## 2017-09-29 LAB — COMPREHENSIVE METABOLIC PANEL
ALT: 151 U/L — AB (ref 17–63)
AST: 171 U/L — AB (ref 15–41)
Albumin: 3.2 g/dL — ABNORMAL LOW (ref 3.5–5.0)
Alkaline Phosphatase: 107 U/L (ref 38–126)
Anion gap: 10 (ref 5–15)
BILIRUBIN TOTAL: 0.9 mg/dL (ref 0.3–1.2)
BUN: 18 mg/dL (ref 6–20)
CO2: 33 mmol/L — ABNORMAL HIGH (ref 22–32)
CREATININE: 1.76 mg/dL — AB (ref 0.61–1.24)
Calcium: 9.3 mg/dL (ref 8.9–10.3)
Chloride: 95 mmol/L — ABNORMAL LOW (ref 101–111)
GFR, EST AFRICAN AMERICAN: 45 mL/min — AB (ref >60)
GFR, EST NON AFRICAN AMERICAN: 39 mL/min — AB (ref >60)
Glucose, Bld: 152 mg/dL — ABNORMAL HIGH (ref 65–99)
POTASSIUM: 3.7 mmol/L (ref 3.5–5.1)
Sodium: 138 mmol/L (ref 135–145)
TOTAL PROTEIN: 7.7 g/dL (ref 6.5–8.1)

## 2017-09-29 LAB — SURGICAL PCR SCREEN
MRSA, PCR: NEGATIVE
STAPHYLOCOCCUS AUREUS: NEGATIVE

## 2017-09-29 LAB — TYPE AND SCREEN
ABO/RH(D): O POS
Antibody Screen: NEGATIVE

## 2017-09-29 LAB — APTT: APTT: 29 s (ref 24–36)

## 2017-09-29 LAB — PROTIME-INR
INR: 1.14
PROTHROMBIN TIME: 14.5 s (ref 11.4–15.2)

## 2017-09-29 NOTE — Telephone Encounter (Signed)
Call from Dr. Lynann Bologna.  Surgery planned for 8 days from now. Labs done today for pre-op. Serum creatinine rise.  Dirty urine. Dr. Lynann Bologna plans to treat for possible UTI. UCx not available on this specimen.  Advised that we will contact this patient to schedule re-evaluatation with PA Weber to reassess creatinine.

## 2017-09-29 NOTE — Telephone Encounter (Signed)
Called Patient 05.15.2019 to schedule appointment for pateint this week, patient stated that he has appt coming up with Dr.Weber in August. Does not need to schedule appt for this week. FR

## 2017-09-29 NOTE — Progress Notes (Signed)
Made donna with Dr. Laurena Bering office aware of CMP and Urine sample.  She will notify Dr. Lynann Bologna

## 2017-09-29 NOTE — Progress Notes (Signed)
PCP - Windell Hummingbird Cardiologist - denies  Chest x-ray - 07/03/17 EKG - 09/29/17 Stress Test - denies ECHO - denies Cardiac Cath - denies Patient is not checking his blood sugars at home  Anesthesia review: NO  Patient denies shortness of breath, fever, cough and chest pain at PAT appointment   Patient verbalized understanding of instructions that were given to them at the PAT appointment. Patient was also instructed that they will need to review over the PAT instructions again at home before surgery.

## 2017-09-29 NOTE — Telephone Encounter (Signed)
Called pt and set up appt to see Dr.Weber at 2:40 Sat 05.18.2019 to reassess creatine.  FR

## 2017-10-01 ENCOUNTER — Encounter (HOSPITAL_COMMUNITY): Payer: Self-pay | Admitting: Emergency Medicine

## 2017-10-01 ENCOUNTER — Telehealth: Payer: Self-pay | Admitting: *Deleted

## 2017-10-01 ENCOUNTER — Encounter (HOSPITAL_COMMUNITY): Payer: Self-pay

## 2017-10-01 NOTE — Progress Notes (Signed)
Anesthesia Chart Review:   Case:  [id number] Date/Time:  10/07/17 0715   Procedures:      ANTERIOR LUMBAR INTERBODY FUSION, LUMBAR 5- SACRUM 1, POSTERIOR SPINAL FUSION WITH INSTRUMENTATION AND ALLOGRAFT.  TIME REQUESTED 5.5 HOURS (N/A )     ABDOMINAL EXPOSURE (N/A )   Anesthesia type:  General   Pre-op diagnosis:  RIGHT LEG PAIN   Location:  [facility] OR ROOM 05 / [facility]   Surgeon:  Phylliss Bob, MD; Angelia Mould, MD      DISCUSSION:  - Pt is a 67 year old male with hx HTN, diabetes, alcohol abuse, hepatitis C.  - AST/ALT elevated since 2011. Platelets 112. PT/PTT normal.   - Cr 1.76, BUN normal. Baseline Cr ~ 1.25. On f/u with PCP 10/02/17, Cr was 2.32.  Surgery will be postponed until pt can see nephrology for further evaluation.    VS: BP 106/74   Pulse 70   Temp 36.4 C   Resp 18   Ht 5' 8.5" (1.74 m)   Wt 176 lb 4.8 oz (80 kg)   SpO2 100%   BMI 26.42 kg/m    PROVIDERS: PCP is [facility], Damaris Hippo, PA-C   Saw cardiologist Kirk Ruths for HTN 12/31/16.   No cardiac testing other than EKG done. F/u PRN recommended.   Saw neurologist Ellouise Newer, MD 01/01/17 for memory loss.  MRI of brain and neurocognitive testing ordered, but has not happened.    LABS:  - Cr 1.76, BUN normal. Baseline Cr ~ 1.25 - AST 171, ALT 151. Appears LFTs consistently elevated since 2011 - HbA1c was 6.4 on 09/17/17  (all labs ordered are listed, but only abnormal results are displayed)  Labs Reviewed  CBC WITH DIFFERENTIAL/PLATELET - Abnormal; Notable for the following components:      Result Value   MCH 35.1 (*)    Platelets 112 (*)    All other components within normal limits  COMPREHENSIVE METABOLIC PANEL - Abnormal; Notable for the following components:   Chloride 95 (*)    CO2 33 (*)    Glucose, Bld 152 (*)    Creatinine, Ser 1.76 (*)    Albumin 3.2 (*)    AST 171 (*)    ALT 151 (*)    GFR calc non Af Amer 39 (*)    GFR calc Af Amer 45 (*)    All other components within normal  limits  URINALYSIS, ROUTINE W REFLEX MICROSCOPIC - Abnormal; Notable for the following components:   Color, Urine AMBER (*)    APPearance CLOUDY (*)    Glucose, UA 50 (*)    Ketones, ur 5 (*)    Protein, ur 100 (*)    Bacteria, UA RARE (*)    Non Squamous Epithelial 0-5 (*)    All other components within normal limits  SURGICAL PCR SCREEN  APTT  PROTIME-INR  TYPE AND SCREEN  ABO/RH     IMAGES:  CXR 07/03/17: No active cardiopulmonary disease.  No evidence of pneumonia.  EKG 09/29/17: Sinus bradycardia (50 bpm). LAFB. Minimal voltage criteria for LVH, may be normal variant    Past Medical History:  Diagnosis Date  . ABUSE, ALCOHOL, IN REMISSION 12/24/2006   Qualifier: Diagnosis of  By: Leward Quan MD, Pamala Hurry    . Diabetes mellitus without complication ([place])   . Elevated LDL cholesterol level 10/05/2016  . Essential hypertension 12/21/2006   Qualifier: Diagnosis of  By: Leward Quan MD, Pamala Hurry    . GERD 12/21/2006  Qualifier: Diagnosis of  By: Leward Quan MD, Pamala Hurry    . HEPATITIS C, CHRONIC VIRAL, W/O HEPATIC COMA 12/24/2006   Qualifier: Diagnosis of  By: Leward Quan MD, Pamala Hurry      Past Surgical History:  Procedure Laterality Date  . FOOT SURGERY    . KNEE SURGERY      MEDICATIONS: . carvedilol (COREG) 12.5 MG tablet  . diphenhydrAMINE (BENADRYL) 25 MG tablet  . gabapentin (NEURONTIN) 300 MG capsule  . meloxicam (MOBIC) 15 MG tablet  . metFORMIN (GLUCOPHAGE-XR) 500 MG 24 hr tablet  . Olmesartan-amLODIPine-HCTZ 40-10-25 MG TABS  . oxymetazoline (AFRIN) 0.05 % nasal spray  . rosuvastatin (CRESTOR) 20 MG tablet  . traMADol (ULTRAM) 50 MG tablet  . sildenafil (REVATIO) 20 MG tablet  . silver sulfADIAZINE (SILVADENE) 1 % cream  . tetrahydrozoline (VISINE) 0.05 % ophthalmic solution  . triamcinolone (NASACORT) 55 MCG/ACT AERO nasal inhaler   No current facility-administered medications for this encounter.     Willeen Cass, FNP-BC [facility] Short Stay Surgical  Center/Anesthesiology Phone: [phone] 10/05/2017 11:57 AM

## 2017-10-01 NOTE — Telephone Encounter (Signed)
Call to patient and instructed to be at this office for appointment with Dr. Scot Dock on 10/06/17 at 3:30 pm. Address given. Verbalized understanding. Also called Dr. Laurena Bering office and spoke with Butch Penny, asked her to call and explain surgery and process to patient.

## 2017-10-02 ENCOUNTER — Other Ambulatory Visit: Payer: Self-pay

## 2017-10-02 ENCOUNTER — Encounter: Payer: Self-pay | Admitting: Physician Assistant

## 2017-10-02 ENCOUNTER — Ambulatory Visit: Payer: BC Managed Care – PPO | Admitting: Physician Assistant

## 2017-10-02 VITALS — BP 115/73 | HR 63 | Temp 97.9°F | Resp 18 | Ht 70.35 in | Wt 175.2 lb

## 2017-10-02 DIAGNOSIS — R7989 Other specified abnormal findings of blood chemistry: Secondary | ICD-10-CM | POA: Diagnosis not present

## 2017-10-02 DIAGNOSIS — N179 Acute kidney failure, unspecified: Secondary | ICD-10-CM

## 2017-10-02 DIAGNOSIS — R319 Hematuria, unspecified: Secondary | ICD-10-CM | POA: Diagnosis not present

## 2017-10-02 DIAGNOSIS — R809 Proteinuria, unspecified: Secondary | ICD-10-CM | POA: Diagnosis not present

## 2017-10-02 DIAGNOSIS — I10 Essential (primary) hypertension: Secondary | ICD-10-CM

## 2017-10-02 LAB — POCT URINALYSIS DIP (MANUAL ENTRY)
Bilirubin, UA: NEGATIVE
GLUCOSE UA: NEGATIVE mg/dL
LEUKOCYTES UA: NEGATIVE
Nitrite, UA: NEGATIVE
Spec Grav, UA: >=1.03 — AB (ref 1.010–1.025)
Urobilinogen, UA: 1 E.U./dL
pH, UA: 5.5 (ref 5.0–8.0)

## 2017-10-02 NOTE — Progress Notes (Addendum)
Gary Maddox  MRN: [id number] DOB: Nov 28, 1950  PCP: Mancel Bale, PA-C  Chief Complaint  Patient presents with  . Preoperative Evaluation    creatinine levels- pt is having surgery on 10/07/17    Subjective:  Pt presents to clinic for recheck blood work.  He was having preop blood work done last week and at that time his creatinine was slightly elevated.  He next surgery scheduled for May 23.  He is currently taking his daily medications without trouble.  He feels he is well hydrated today.  He has switched pharmacies due to the local pharmacy closing he is now at [facility] his Viagra cost has gone up significantly, he would like to know if there is anything else he can do.  History is obtained by patient.  Review of Systems  Patient Active Problem List   Diagnosis Date Noted  . Memory deficit 06/25/2017  . Chronic back pain 06/25/2017  . Type 2 diabetes mellitus without complication, without long-term current use of insulin ([place]) 10/05/2016  . Elevated LDL cholesterol level 10/05/2016  . 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 Essential hypertension 12/21/2006  . GERD 12/21/2006    Current Outpatient Medications on File Prior to Visit  Medication Sig Dispense Refill  . carvedilol (COREG) 12.5 MG tablet Take 1 tablet (12.5 mg total) by mouth 2 (two) times daily with a meal. 60 tablet 2  . diphenhydrAMINE (BENADRYL) 25 MG tablet Take 25 mg by mouth every 6 (six) hours as needed for allergies.    Marland Kitchen gabapentin (NEURONTIN) 300 MG capsule 345m by mouth twice daily  0  . meloxicam (MOBIC) 15 MG tablet 128mby mouth twice daily  0  . metFORMIN (GLUCOPHAGE-XR) 500 MG 24 hr tablet Take 1 tablet (500 mg total) by mouth daily with breakfast. 90 tablet 0  . Olmesartan-amLODIPine-HCTZ 40-10-25 MG TABS TAKE 1 TABLET BY MOUTH ONCE DAILY 30 tablet 0  . oxymetazoline (AFRIN) 0.05 % nasal spray Place 1 spray into both nostrils 2  (two) times daily as needed for congestion.    . rosuvastatin (CRESTOR) 20 MG tablet Take 1 tablet (20 mg total) by mouth daily. 90 tablet 0  . sildenafil (REVATIO) 20 MG tablet Take 2-5 tablets (40-100 mg total) by mouth daily as needed. 10 tablet 5  . traMADol (ULTRAM) 50 MG tablet Take 50 mg by mouth every 6 (six) hours as needed. for pain  0  . silver sulfADIAZINE (SILVADENE) 1 % cream Apply 1 application topically daily. (Patient not taking: Reported on 09/29/2017) 50 g 1  . tetrahydrozoline (VISINE) 0.05 % ophthalmic solution Place 1 drop into both eyes daily as needed (dry eyes). (Patient not taking: Reported on 09/29/2017) 15 mL 0  . triamcinolone (NASACORT) 55 MCG/ACT AERO nasal inhaler Place 2 sprays into the nose daily. (Patient not taking: Reported on 09/29/2017) 1 Inhaler 12   No current facility-administered medications on file prior to visit.     No Known Allergies  Past Medical History:  Diagnosis Date  . ABUSE, ALCOHOL, IN REMISSION 12/24/2006   Qualifier: Diagnosis of  By: McLeward QuanD, BaPamala Hurry  . Diabetes mellitus without complication ([place]  . Elevated LDL cholesterol level 10/05/2016  . Essential hypertension 12/21/2006   Qualifier: Diagnosis of  By: McLeward QuanD, BaPamala Hurry  . GERD 12/21/2006   Qualifier: Diagnosis of  By: McLeward QuanD, BaPamala Hurry  . 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 Smoking status: Former Smoker    Types: Cigarettes  . Smokeless tobacco: Never Used  . Tobacco comment: quit a long time ago about 25 years ago  Substance Use Topics  . Alcohol use: Yes    Comment: 1/5 of liquor  . Drug use: No    family history includes Hypertension in his father and mother.     Objective:  BP 115/73 (BP Location: Right Arm, Patient Position: Sitting, Cuff Size: Normal)   Pulse 63   Temp 97.9 F (36.6 C) (Oral)   Resp 18   Ht 5' 10.35" (1.787 m)   Wt 175 lb 3.2 oz (79.5 kg)   SpO2 98%   BMI 24.89 kg/m  Body mass index is 24.89 kg/m.  Physical Exam  Constitutional: He is oriented to person, place, and time.  HENT:  Head: Normocephalic and atraumatic.  Right Ear: External ear normal.  Left Ear: External ear normal.  Eyes: Conjunctivae are normal.  Neck: Normal range of motion.  Pulmonary/Chest: Effort normal.  Neurological: He is alert and oriented to person, place, and time.  Skin: Skin is warm and dry.  Psychiatric: Judgment normal.   Results for orders placed or performed in visit on 10/02/17  CMP14+EGFR  Result Value Ref Range   Glucose 123 (H) 65 - 99 mg/dL   BUN 30 (H) 8 - 27 mg/dL   Creatinine, Ser 2.32 (H) 0.76 - 1.27 mg/dL   GFR calc non Af Amer 29 (L) >59 mL/min/1.73   GFR calc Af Amer 33 (L) >59 mL/min/1.73   BUN/Creatinine Ratio 13 10 - 24   Sodium 138 134 - 144 mmol/L   Potassium 3.2 (L) 3.5 - 5.2 mmol/L   Chloride 94 (L) 96 - 106 mmol/L   CO2 28 20 - 29 mmol/L   Calcium 9.1 8.6 - 10.2 mg/dL   Total Protein 7.3 6.0 - 8.5 g/dL   Albumin 3.7 3.6 - 4.8 g/dL   Globulin, Total 3.6 1.5 - 4.5 g/dL   Albumin/Globulin Ratio 1.0 (L) 1.2 - 2.2   Bilirubin Total 0.7 0.0 - 1.2 mg/dL   Alkaline Phosphatase 108 39 - 117 IU/L   AST 182 (H) 0 - 40 IU/L   ALT 157 (H) 0 - 44 IU/L  POCT urinalysis dipstick  Result Value Ref Range   Color, UA yellow yellow   Clarity, UA cloudy (A) clear   Glucose, UA negative negative mg/dL   Bilirubin, UA negative negative   Ketones, POC UA trace (5) (A) negative mg/dL   Spec Grav, UA >=1.030 (A) 1.010 - 1.025   Blood, UA moderate (A) negative   pH, UA 5.5 5.0 - 8.0   Protein Ur, POC >=300 (A) negative mg/dL   Urobilinogen, UA 1.0 0.2  or 1.0 E.U./dL   Nitrite, UA Negative Negative   Leukocytes, UA Negative Negative    Assessment and Plan :  Elevated serum creatinine - Plan: POCT urinalysis dipstick, CMP14+EGFR, Ambulatory referral to Nephrology  Essential hypertension -  Plan: POCT urinalysis dipstick, Ambulatory referral to Nephrology  Hematuria, unspecified type - Plan: Ambulatory referral to Nephrology  Proteinuria, unspecified type - Plan: Ambulatory referral to Nephrology  Acute renal failure, unspecified acute renal failure type [facility]) - Plan: Ambulatory referral to Nephrology   Wait for lab results.  I wondered if he was dehydrated to his last lab draw at the pre-op, but his specific gravity is actually higher today.  Wait for his labs to determine next step.  He will likely need referral to urologist.  Discussed with patient to keep stool soft after surgery with [facility].  He was given phone number and address for [facility] to help him with his Viagra costs.  Upon lab return - [facility] contacted for urgent visit with them this week due to acute renal failure.  Will contact patient to stop his metformin and Tribenzor and I will cal in Norvasc for him to take during this time.  I will also contact Dr Lynann Bologna regarding this results and the needs to likely postpone surgery until we figure out what is going on.  Windell Hummingbird PA-C  [facility] 10/04/2017 2:11 PM

## 2017-10-02 NOTE — Patient Instructions (Addendum)
Colace - keeps the stool soft after surgery while on pain medications  viagra - [facility] in [place], [place], Phone: [phone]    IF you received an x-ray today, you will receive an invoice from [facility]. Please contact [facility] at [phone] with questions or concerns regarding your invoice.   IF you received labwork today, you will receive an invoice from [place]. Please contact [facility] at [phone] with questions or concerns regarding your invoice.   Our billing staff will not be able to assist you with questions regarding bills from these companies.  You will be contacted with the lab results as soon as they are available. The fastest way to get your results is to activate your My Chart account. Instructions are located on the last page of this paperwork. If you have not heard from [place] regarding the results in 2 weeks, please contact this office.

## 2017-10-03 LAB — CMP14+EGFR
ALK PHOS: 108 IU/L (ref 39–117)
ALT: 157 IU/L — AB (ref 0–44)
AST: 182 IU/L — ABNORMAL HIGH (ref 0–40)
Albumin/Globulin Ratio: 1 — ABNORMAL LOW (ref 1.2–2.2)
Albumin: 3.7 g/dL (ref 3.6–4.8)
BILIRUBIN TOTAL: 0.7 mg/dL (ref 0.0–1.2)
BUN/Creatinine Ratio: 13 (ref 10–24)
BUN: 30 mg/dL — AB (ref 8–27)
CHLORIDE: 94 mmol/L — AB (ref 96–106)
CO2: 28 mmol/L (ref 20–29)
Calcium: 9.1 mg/dL (ref 8.6–10.2)
Creatinine, Ser: 2.32 mg/dL — ABNORMAL HIGH (ref 0.76–1.27)
GFR calc Af Amer: 33 mL/min/{1.73_m2} — ABNORMAL LOW (ref >59)
GFR calc non Af Amer: 29 mL/min/{1.73_m2} — ABNORMAL LOW (ref >59)
GLUCOSE: 123 mg/dL — AB (ref 65–99)
Globulin, Total: 3.6 g/dL (ref 1.5–4.5)
Potassium: 3.2 mmol/L — ABNORMAL LOW (ref 3.5–5.2)
Sodium: 138 mmol/L (ref 134–144)
Total Protein: 7.3 g/dL (ref 6.0–8.5)

## 2017-10-04 ENCOUNTER — Telehealth: Payer: Self-pay | Admitting: Physician Assistant

## 2017-10-04 DIAGNOSIS — R7989 Other specified abnormal findings of blood chemistry: Secondary | ICD-10-CM

## 2017-10-04 DIAGNOSIS — R809 Proteinuria, unspecified: Secondary | ICD-10-CM

## 2017-10-04 DIAGNOSIS — I10 Essential (primary) hypertension: Secondary | ICD-10-CM

## 2017-10-04 DIAGNOSIS — R319 Hematuria, unspecified: Secondary | ICD-10-CM

## 2017-10-04 NOTE — Telephone Encounter (Signed)
Spoke with [employer], Probation officer, at [employer]. Let her know we had an urgent referral in for pt and Judson Roch wanted to get him scheduled asap. Amber stated they are unable to schedule pts until their doctor has looked at pt's records, which have been faxed. I let her know this was urgent and asked what the turn around time would be for [place] to know something. She said their doctor should have records reviewed by tomorrow and then they will get pt scheduled. I can track the status of pt's referral through our new proficient referral system, so I will keep checking to see when pt is scheduled. Also advised of Sarah's note in the referral concerning medications and wrote on referral and in proficient that referral is urgent.

## 2017-10-04 NOTE — Addendum Note (Signed)
Addended by: Mancel Bale on: 10/04/2017 02:21 PM   Modules accepted: Orders, Level of Service

## 2017-10-04 NOTE — Telephone Encounter (Addendum)
Call for Dr Lynann Bologna regarding the patient's recent lab results.  Spoke with Dr Lynann Bologna.  Agreed that surgery should be postpones until we know what is happening with his kidneys.

## 2017-10-04 NOTE — Telephone Encounter (Signed)
Called patient -  NO answer and no machine on [phone]  Kidney function is worse than it was on 5/15.    1 - Stop metformin 2- stop BP medication tribenzor - I have called him in norvasc which is the only medication he should be on currently.  I know that his BP is going to get really high.  We will watch it 3 - stop gabapentin 4- stop mobic  Was told about renal US - he will go when he hears about the appt. He will go to neprhologist appt when he hears about it.  I also talked to his wife regarding these instructions about his medications.

## 2017-10-05 ENCOUNTER — Ambulatory Visit (HOSPITAL_COMMUNITY)
Admission: RE | Admit: 2017-10-05 | Discharge: 2017-10-05 | Disposition: A | Discharge status: Home / Self Care | Payer: BC Managed Care – PPO | Source: Ambulatory Visit | Attending: Physician Assistant | Admitting: Physician Assistant

## 2017-10-05 DIAGNOSIS — R93422 Abnormal radiologic findings on diagnostic imaging of left kidney: Secondary | ICD-10-CM | POA: Insufficient documentation

## 2017-10-05 DIAGNOSIS — R7989 Other specified abnormal findings of blood chemistry: Secondary | ICD-10-CM

## 2017-10-05 DIAGNOSIS — I10 Essential (primary) hypertension: Secondary | ICD-10-CM

## 2017-10-05 DIAGNOSIS — R93421 Abnormal radiologic findings on diagnostic imaging of right kidney: Secondary | ICD-10-CM | POA: Diagnosis not present

## 2017-10-05 DIAGNOSIS — R319 Hematuria, unspecified: Secondary | ICD-10-CM | POA: Diagnosis not present

## 2017-10-05 DIAGNOSIS — R809 Proteinuria, unspecified: Secondary | ICD-10-CM | POA: Diagnosis not present

## 2017-10-05 NOTE — Telephone Encounter (Signed)
Pt calling and states if he can get in at 4pm today at the hospital he will be there. States this is for imaging. Please advise.

## 2017-10-05 NOTE — Telephone Encounter (Signed)
I will wait for imaging results.

## 2017-10-05 NOTE — Telephone Encounter (Signed)
Pt called and confirmed that he will be at imaging appt at 5 pm today.   No cb needed

## 2017-10-05 NOTE — Telephone Encounter (Signed)
Copied from [place] [phone]. Topic: Quick Communication - See Telephone Encounter >> Oct 04, 2017  5:14 PM Arletha Grippe wrote: CRM for notification. See Telephone encounter for: 10/04/17. Pt is asking for a call - he was supposed have surgery and it was postponed.  He was told to call his primary please call back [phone] or [phone]

## 2017-10-06 ENCOUNTER — Encounter: Payer: BC Managed Care – PPO | Admitting: Vascular Surgery

## 2017-10-07 ENCOUNTER — Inpatient Hospital Stay (HOSPITAL_COMMUNITY)
Admission: RE | Admit: 2017-10-07 | Payer: No Typology Code available for payment source | Source: Ambulatory Visit | Admitting: Orthopedic Surgery

## 2017-10-07 ENCOUNTER — Telehealth: Payer: Self-pay | Admitting: Physician Assistant

## 2017-10-07 ENCOUNTER — Encounter (HOSPITAL_COMMUNITY): Admission: RE | Payer: Self-pay | Source: Ambulatory Visit

## 2017-10-07 SURGERY — ANTERIOR LUMBAR FUSION 1 LEVEL
Anesthesia: General

## 2017-10-07 NOTE — Telephone Encounter (Signed)
Pt scheduled with [facility] on 10/08/17 at 8:00am. Renal U/S faxed to [facility] 5/23

## 2017-10-08 ENCOUNTER — Other Ambulatory Visit: Payer: Self-pay | Admitting: Physician Assistant

## 2017-10-08 DIAGNOSIS — I10 Essential (primary) hypertension: Secondary | ICD-10-CM

## 2017-10-12 ENCOUNTER — Encounter: Payer: Self-pay | Admitting: Family Medicine

## 2017-10-20 ENCOUNTER — Other Ambulatory Visit: Payer: Self-pay

## 2017-10-20 ENCOUNTER — Ambulatory Visit: Payer: BC Managed Care – PPO | Admitting: Physician Assistant

## 2017-10-20 ENCOUNTER — Encounter: Payer: Self-pay | Admitting: Physician Assistant

## 2017-10-20 ENCOUNTER — Telehealth: Payer: Self-pay

## 2017-10-20 VITALS — BP 140/88 | HR 56 | Temp 97.9°F | Resp 18 | Ht 70.35 in | Wt 171.2 lb

## 2017-10-20 DIAGNOSIS — I10 Essential (primary) hypertension: Secondary | ICD-10-CM | POA: Diagnosis not present

## 2017-10-20 DIAGNOSIS — R7989 Other specified abnormal findings of blood chemistry: Secondary | ICD-10-CM | POA: Diagnosis not present

## 2017-10-20 NOTE — Progress Notes (Signed)
Gary Maddox  MRN: [id number] DOB: [birth year]  PCP: Mancel Bale, PA-C  Chief Complaint  Patient presents with  . lab results    follow up     Subjective:  Pt presents to clinic for recheck lab work.  He did not go to his appt at [facility] because he states that he did not know about it.  He has felt fine since his last visit with me on 5/18.   He has been using Coreg and tramadol.  He has not taken any Tribenzor or metformin since he was told not to after his 5/18 appt.  No leg swelling, no skin itching.  History is obtained by patient.  Review of Systems  Patient Active Problem List   Diagnosis Date Noted  . Memory deficit 06/25/2017  . Chronic back pain 06/25/2017  . Type 2 diabetes mellitus without complication, without long-term current use of insulin ([facility]) 10/05/2016  . Elevated LDL cholesterol level 10/05/2016  . 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 Essential hypertension 12/21/2006  . GERD 12/21/2006    Current Outpatient Medications on File Prior to Visit  Medication Sig Dispense Refill  . carvedilol (COREG) 12.5 MG tablet Take 1 tablet (12.5 mg total) by mouth 2 (two) times daily with a meal. 60 tablet 2  . traMADol (ULTRAM) 50 MG tablet Take 50 mg by mouth every 6 (six) hours as needed. for pain  0  . metFORMIN (GLUCOPHAGE-XR) 500 MG 24 hr tablet Take 1 tablet (500 mg total) by mouth daily with breakfast. (Patient not taking: Reported on 10/20/2017) 90 tablet 0  . Olmesartan-amLODIPine-HCTZ 40-10-25 MG TABS TAKE 1 TABLET BY MOUTH EVERY DAY (Patient not taking: Reported on 10/20/2017) 30 tablet 0  . rosuvastatin (CRESTOR) 20 MG tablet Take 1 tablet (20 mg total) by mouth daily. (Patient not taking: Reported on 10/20/2017) 90 tablet 0  . silver sulfADIAZINE (SILVADENE) 1 % cream Apply 1 application topically daily. (Patient not taking: Reported on 09/29/2017) 50 g 1   No current  facility-administered medications on file prior to visit.     No Known Allergies  Past Medical History:  Diagnosis Date  . ABUSE, ALCOHOL, IN REMISSION 12/24/2006   Qualifier: Diagnosis of  By: Leward Quan MD, Pamala Hurry    . Diabetes mellitus without complication ([place])   . Elevated LDL cholesterol level 10/05/2016  . Essential hypertension 12/21/2006   Qualifier: Diagnosis of  By: Leward Quan MD, Pamala Hurry    . GERD 12/21/2006   Qualifier: Diagnosis of  By: Leward Quan MD, Pamala Hurry    . 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 Smoking status: Former Smoker    Types: Cigarettes  . Smokeless tobacco: Never Used  . Tobacco comment: quit a long time ago about 25 years ago  Substance Use Topics  . Alcohol use: Yes    Comment: 1/5 of liquor  . Drug use: No   family history includes Hypertension in his father  and mother.     Objective:  BP 140/88   Pulse (!) 56   Temp 97.9 F (36.6 C) (Oral)   Resp 18   Ht 5' 10.35" (1.787 m)   Wt 171 lb 3.2 oz (77.7 kg)   SpO2 98%   BMI 24.32 kg/m  Body mass index is 24.32 kg/m.  Wt Readings from Last 3 Encounters:  10/20/17 171 lb 3.2 oz (77.7 kg)  10/02/17 175 lb 3.2 oz (79.5 kg)  09/29/17 176 lb 4.8 oz (80 kg)    Physical Exam  Constitutional: He is oriented to person, place, and time. He appears well-developed and well-nourished.  HENT:  Head: Normocephalic and atraumatic.  Right Ear: External ear normal.  Left Ear: External ear normal.  Eyes: Conjunctivae are normal.  Neck: Normal range of motion.  Cardiovascular: Normal rate, regular rhythm, normal heart sounds and intact distal pulses.    No murmur heard. Pulmonary/Chest: Effort normal and breath sounds normal. He has no wheezes.  Musculoskeletal:       Right lower leg: He exhibits no edema.       Left lower leg: He exhibits no edema.  Neurological: He is alert and oriented to person, place, and time.  Skin: Skin is warm and dry.  Psychiatric: Judgment normal.  Vitals reviewed.   Assessment and Plan :  Elevated serum creatinine - Plan: CMP14+EGFR, CBC with Differential/Platelet, Urinalysis, dipstick only  Essential hypertension - BP is controlled on just Coreg - which I am surprised about as in the past he has had to be on tribenzor to control his blood pressure.  He is urinating ok.  We will recheck labs today and determine next step.  I am concerned that he did not see kidney specialist but if his kidney function is like it was we will reschedule appt.   Windell Hummingbird PA-C  [facility] 10/20/2017 5:43 PM

## 2017-10-20 NOTE — Patient Instructions (Addendum)
[facility]    IF you received an x-ray today, you will receive an invoice from [facility]. Please contact [facility] at [phone] with questions or concerns regarding your invoice.   IF you received labwork today, you will receive an invoice from [facility]. Please contact [facility] at [phone] with questions or concerns regarding your invoice.   Our billing staff will not be able to assist you with questions regarding bills from these companies.  You will be contacted with the lab results as soon as they are available. The fastest way to get your results is to activate your My Chart account. Instructions are located on the last page of this paperwork. If you have not heard from [place] regarding the results in 2 weeks, please contact this office.

## 2017-10-20 NOTE — Telephone Encounter (Signed)
Copied from [place] [phone]. Topic: General - Other >> Oct 20, 2017  8:24 AM Oneta Rack wrote: Relation to pt: self Call back number: [phone]   Reason for call:  Patient inquiring about lab results taken on 10/02/17, please advise >> Oct 20, 2017  8:25 AM Oneta Rack wrote: Relation to pt: self Call back number: [phone]   Reason for call:  Patient inquiring about lab results taken on 10/02/17, please advise

## 2017-10-20 NOTE — Telephone Encounter (Signed)
Pt advised that he needs to return to clinic per last lab note. Pt transferred to scheduling.

## 2017-10-21 LAB — URINALYSIS, DIPSTICK ONLY
Bilirubin, UA: NEGATIVE
Glucose, UA: NEGATIVE
Nitrite, UA: NEGATIVE
RBC, UA: NEGATIVE
UUROB: 1 mg/dL (ref 0.2–1.0)
pH, UA: 5.5 (ref 5.0–7.5)

## 2017-10-21 LAB — CMP14+EGFR
A/G RATIO: 1 — AB (ref 1.2–2.2)
ALT: 142 IU/L — AB (ref 0–44)
AST: 139 IU/L — AB (ref 0–40)
Albumin: 3.9 g/dL (ref 3.6–4.8)
Alkaline Phosphatase: 111 IU/L (ref 39–117)
BILIRUBIN TOTAL: 1.4 mg/dL — AB (ref 0.0–1.2)
BUN/Creatinine Ratio: 10 (ref 10–24)
BUN: 11 mg/dL (ref 8–27)
CHLORIDE: 100 mmol/L (ref 96–106)
CO2: 29 mmol/L (ref 20–29)
Calcium: 9.2 mg/dL (ref 8.6–10.2)
Creatinine, Ser: 1.14 mg/dL (ref 0.76–1.27)
GFR calc non Af Amer: 68 mL/min/{1.73_m2} (ref >59)
GFR, EST AFRICAN AMERICAN: 78 mL/min/{1.73_m2} (ref >59)
GLUCOSE: 109 mg/dL — AB (ref 65–99)
Globulin, Total: 4 g/dL (ref 1.5–4.5)
POTASSIUM: 4 mmol/L (ref 3.5–5.2)
Sodium: 140 mmol/L (ref 134–144)
TOTAL PROTEIN: 7.9 g/dL (ref 6.0–8.5)

## 2017-10-21 LAB — CBC WITH DIFFERENTIAL/PLATELET
BASOS ABS: 0 10*3/uL (ref 0.0–0.2)
Basos: 0 %
EOS (ABSOLUTE): 0.3 10*3/uL (ref 0.0–0.4)
Eos: 5 %
HEMOGLOBIN: 15.4 g/dL (ref 13.0–17.7)
Hematocrit: 43.6 % (ref 37.5–51.0)
IMMATURE GRANS (ABS): 0 10*3/uL (ref 0.0–0.1)
IMMATURE GRANULOCYTES: 0 %
LYMPHS: 42 %
Lymphocytes Absolute: 2.9 10*3/uL (ref 0.7–3.1)
MCH: 35.3 pg — ABNORMAL HIGH (ref 26.6–33.0)
MCHC: 35.3 g/dL (ref 31.5–35.7)
MCV: 100 fL — ABNORMAL HIGH (ref 79–97)
MONOCYTES: 10 %
Monocytes Absolute: 0.7 10*3/uL (ref 0.1–0.9)
NEUTROS ABS: 2.9 10*3/uL (ref 1.4–7.0)
NEUTROS PCT: 43 %
PLATELETS: 131 10*3/uL — AB (ref 150–450)
RBC: 4.36 x10E6/uL (ref 4.14–5.80)
RDW: 14.4 % (ref 12.3–15.4)
WBC: 6.9 10*3/uL (ref 3.4–10.8)

## 2017-10-25 ENCOUNTER — Telehealth: Payer: Self-pay | Admitting: Physician Assistant

## 2017-10-25 NOTE — Telephone Encounter (Signed)
Copied from [place] [phone]. Topic: Quick Communication - See Telephone Encounter >> Oct 25, 2017  2:51 PM Rutherford Nail, NT wrote: CRM for notification. See Telephone encounter for: 10/25/17. Patient calling in regards to his 10/20/17 lab results. States he has not received a phone call about these yet. Would like a call back to go over them. Please advise. Cb#: [phone]

## 2017-10-25 NOTE — Telephone Encounter (Signed)
Left VM to call back 

## 2017-11-09 ENCOUNTER — Ambulatory Visit: Payer: BC Managed Care – PPO | Admitting: Physician Assistant

## 2017-11-09 ENCOUNTER — Other Ambulatory Visit: Payer: Self-pay

## 2017-11-09 ENCOUNTER — Encounter: Payer: Self-pay | Admitting: Physician Assistant

## 2017-11-09 VITALS — BP 134/82 | HR 68 | Temp 97.9°F | Resp 18 | Ht 70.35 in | Wt 176.6 lb

## 2017-11-09 DIAGNOSIS — E119 Type 2 diabetes mellitus without complications: Secondary | ICD-10-CM | POA: Diagnosis not present

## 2017-11-09 DIAGNOSIS — R7989 Other specified abnormal findings of blood chemistry: Secondary | ICD-10-CM | POA: Diagnosis not present

## 2017-11-09 DIAGNOSIS — E7800 Pure hypercholesterolemia, unspecified: Secondary | ICD-10-CM

## 2017-11-09 DIAGNOSIS — R809 Proteinuria, unspecified: Secondary | ICD-10-CM

## 2017-11-09 DIAGNOSIS — R319 Hematuria, unspecified: Secondary | ICD-10-CM | POA: Diagnosis not present

## 2017-11-09 DIAGNOSIS — R413 Other amnesia: Secondary | ICD-10-CM | POA: Diagnosis not present

## 2017-11-09 DIAGNOSIS — I10 Essential (primary) hypertension: Secondary | ICD-10-CM

## 2017-11-09 NOTE — Patient Instructions (Signed)
     IF you received an x-ray today, you will receive an invoice from [facility]. Please contact [facility] at [phone] with questions or concerns regarding your invoice.   IF you received labwork today, you will receive an invoice from [facility]. Please contact [facility] at [phone] with questions or concerns regarding your invoice.   Our billing staff will not be able to assist you with questions regarding bills from these companies.  You will be contacted with the lab results as soon as they are available. The fastest way to get your results is to activate your My Chart account. Instructions are located on the last page of this paperwork. If you have not heard from us regarding the results in 2 weeks, please contact this office.     

## 2017-11-09 NOTE — Progress Notes (Signed)
Gary Maddox  MRN: [id number] DOB: 09-27-1950  PCP: Mancel Bale, PA-C  Chief Complaint  Patient presents with  . kidney function    follow up     Subjective:  Gary Maddox is a 67 year old male presenting for reevaluation of kidney function. He is overall doing well. He is having no difficulty urinating and is going about 8-10 times daily without issues emptying bladder. Urine has been white, yellow, or very light brown. Denies edema.  Upon going over his medications the only BP medication he is taking is Coreg.  He does not remember his problems with his kidneys earlier this month.  History is obtained by patient.  Review of Systems  Constitutional: Negative for chills, fatigue and fever.  Eyes: Negative for photophobia, pain and visual disturbance.  Respiratory: Negative for chest tightness and shortness of breath.   Cardiovascular: Negative for chest pain and palpitations.  Gastrointestinal: Negative for abdominal pain, blood in stool, diarrhea and vomiting.  Genitourinary: Negative for difficulty urinating, dysuria, flank pain and hematuria.  Skin: Negative for color change and rash.  Neurological: Positive for numbness (chronic right leg - from accident). Negative for dizziness, light-headedness and headaches.    Patient Active Problem List   Diagnosis Date Noted  . Memory deficit 06/25/2017  . Chronic back pain 06/25/2017  . Type 2 diabetes mellitus without complication, without long-term current use of insulin ([place]) 10/05/2016  . Elevated LDL cholesterol level 10/05/2016  . 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 Essential hypertension 12/21/2006  . GERD 12/21/2006    Current Outpatient Medications on File Prior to Visit  Medication Sig Dispense Refill  . carvedilol (COREG) 12.5 MG tablet Take 1 tablet (12.5 mg total) by mouth 2 (two) times daily with a meal. 60 tablet 2  . sildenafil  (REVATIO) 20 MG tablet Take 20-40 mg by mouth as needed.    . traMADol (ULTRAM) 50 MG tablet Take 50 mg by mouth every 6 (six) hours as needed. for pain  0  . silver sulfADIAZINE (SILVADENE) 1 % cream Apply 1 application topically daily. (Patient not taking: Reported on 09/29/2017) 50 g 1   No current facility-administered medications on file prior to visit.     No Known Allergies  Past Medical History:  Diagnosis Date  . ABUSE, ALCOHOL, IN REMISSION 12/24/2006   Qualifier: Diagnosis of  By: Leward Quan MD, Pamala Hurry    . Diabetes mellitus without complication ([place])   . Elevated LDL cholesterol level 10/05/2016  . Essential hypertension 12/21/2006   Qualifier: Diagnosis of  By: Leward Quan MD, Pamala Hurry    . GERD 12/21/2006   Qualifier: Diagnosis of  By: Leward Quan MD, Pamala Hurry    . 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 Smoking status: Former Smoker    Types: Cigarettes  . Smokeless tobacco: Never Used  . Tobacco comment: quit a long time ago about 25 years ago  Substance Use Topics  . Alcohol use:  Yes    Comment: 1/5 of liquor  . Drug use: No   family history includes Hypertension in his father and mother.     Objective:  BP 134/82   Pulse 68   Temp 97.9 F (36.6 C) (Oral)   Resp 18   Ht 5' 10.35" (1.787 m)   Wt 176 lb 9.6 oz (80.1 kg)   SpO2 98%   BMI 25.09 kg/m  Body mass index is 25.09 kg/m.  Wt Readings from Last 3 Encounters:  11/09/17 176 lb 9.6 oz (80.1 kg)  10/20/17 171 lb 3.2 oz (77.7 kg)  10/02/17 175 lb 3.2 oz (79.5 kg)    Physical Exam  Constitutional: He is oriented to person, place, and time.  HENT:  Head:  Normocephalic and atraumatic.  Right Ear: External ear normal.  Left Ear: External ear normal.  Eyes: Conjunctivae are normal.  Neck: Normal range of motion.  Cardiovascular: Normal rate, regular rhythm and normal heart sounds.  No murmur heard. Pulmonary/Chest: Effort normal and breath sounds normal. He has no wheezes.  Neurological: He is alert and oriented to person, place, and time.  Skin: Skin is warm and dry.  Psychiatric: Judgment normal.    Assessment and Plan :  Essential hypertension - Plan: CMP14+EGFR - currently well controlled - prior to his acute renal problems pt was on 4 medications for his BP but over the last 3 weks he has only been on Coreg and his BP is well controlled.  Pt will bring his medications to the next visit both the ones he is taking and then ones he is not taking so we can make sure of the medications.  Elevated LDL cholesterol level - Plan: Lipid panel - check labs  Type 2 diabetes mellitus without complication, without long-term current use of insulin ([facility]) - Plan: Hemoglobin A1c- pt was on metformin but he has to stop due to acute renal problems which have now seemed to resolve - for now we will monitor.  Elevated serum creatinine - Plan: CMP14+EGFR - kidney function has seemed to resolve - will continue to monitor for the next month and then if ok can ok patient to have surgery - ? Related to inappropriate medication use at some point  Hematuria, unspecified type - Plan: Urinalysis, dipstick only  Proteinuria, unspecified type - Plan: Urinalysis, dipstick only  Memory deficit - monitor  Patient verbalized to me that they understand the following: diagnosis, what is being done for them, what to expect and what should be done at home.  Their questions have been answered.  See after visit summary for patient specific instructions.  Windell Hummingbird PA-C  [facility] 11/11/2017 1:11 AM  Please note: Portions of this  report may have been transcribed using dragon voice recognition software. Every effort was made to ensure accuracy; however, inadvertent computerized transcription errors may be present.

## 2017-11-10 LAB — CMP14+EGFR
A/G RATIO: 0.9 — AB (ref 1.2–2.2)
ALK PHOS: 101 IU/L (ref 39–117)
ALT: 139 IU/L — AB (ref 0–44)
AST: 158 IU/L — ABNORMAL HIGH (ref 0–40)
Albumin: 3.4 g/dL — ABNORMAL LOW (ref 3.6–4.8)
BUN/Creatinine Ratio: 12 (ref 10–24)
BUN: 15 mg/dL (ref 8–27)
Bilirubin Total: 1 mg/dL (ref 0.0–1.2)
CHLORIDE: 99 mmol/L (ref 96–106)
CO2: 26 mmol/L (ref 20–29)
CREATININE: 1.23 mg/dL (ref 0.76–1.27)
Calcium: 8.7 mg/dL (ref 8.6–10.2)
GFR calc non Af Amer: 62 mL/min/{1.73_m2} (ref >59)
GFR, EST AFRICAN AMERICAN: 71 mL/min/{1.73_m2} (ref >59)
Globulin, Total: 3.6 g/dL (ref 1.5–4.5)
Glucose: 111 mg/dL — ABNORMAL HIGH (ref 65–99)
POTASSIUM: 3.8 mmol/L (ref 3.5–5.2)
Sodium: 138 mmol/L (ref 134–144)
TOTAL PROTEIN: 7 g/dL (ref 6.0–8.5)

## 2017-11-10 LAB — LIPID PANEL
CHOL/HDL RATIO: 4.5 ratio (ref 0.0–5.0)
Cholesterol, Total: 147 mg/dL (ref 100–199)
HDL: 33 mg/dL — ABNORMAL LOW (ref >39)
LDL Calculated: 93 mg/dL (ref 0–99)
Triglycerides: 105 mg/dL (ref 0–149)
VLDL Cholesterol Cal: 21 mg/dL (ref 5–40)

## 2017-11-10 LAB — URINALYSIS, DIPSTICK ONLY
Bilirubin, UA: NEGATIVE
Glucose, UA: NEGATIVE
KETONES UA: NEGATIVE
Leukocytes, UA: NEGATIVE
NITRITE UA: NEGATIVE
PH UA: 6 (ref 5.0–7.5)
Protein, UA: NEGATIVE
RBC UA: NEGATIVE
SPEC GRAV UA: 1.011 (ref 1.005–1.030)
UUROB: 1 mg/dL (ref 0.2–1.0)

## 2017-11-10 LAB — HEMOGLOBIN A1C
ESTIMATED AVERAGE GLUCOSE: 126 mg/dL
Hgb A1c MFr Bld: 6 % — ABNORMAL HIGH (ref 4.8–5.6)

## 2017-11-11 ENCOUNTER — Encounter: Payer: Self-pay | Admitting: Physician Assistant

## 2017-12-02 ENCOUNTER — Other Ambulatory Visit: Payer: Self-pay | Admitting: Physician Assistant

## 2017-12-02 NOTE — Telephone Encounter (Signed)
Copied from [place] [id number]. Topic: Quick Communication - Rx Refill/Question >> Dec 02, 2017  3:22 PM Synthia Innocent wrote: Medication: sildenafil (REVATIO) 20 MG tablet   Has the patient contacted their pharmacy? Yes.   (Agent: If no, request that the patient contact the pharmacy for the refill.) (Agent: If yes, when and what did the pharmacy advise?)  Preferred Pharmacy (with phone number or street name): [facility]   Agent: Please be advised that RX refills may take up to 3 business days. We ask that you follow-up with your pharmacy.

## 2017-12-03 NOTE — Telephone Encounter (Signed)
Rx refill request: sildenafil 20 mg   Last filled: 11/09/17  LOV: 11/09/17  PCP: Gale Journey  [facility]: [facility]

## 2017-12-05 ENCOUNTER — Other Ambulatory Visit: Payer: Self-pay | Admitting: Physician Assistant

## 2017-12-05 DIAGNOSIS — I10 Essential (primary) hypertension: Secondary | ICD-10-CM

## 2017-12-07 ENCOUNTER — Telehealth: Payer: Self-pay | Admitting: Physician Assistant

## 2017-12-07 MED ORDER — SILDENAFIL CITRATE 20 MG PO TABS: 20.0000 mg | ORAL_TABLET | ORAL | 0 refills | Status: DC | PRN

## 2017-12-07 NOTE — Telephone Encounter (Signed)
Resend to  [facility], [place] - [place], [place] - [address]  [address] [place] [address]  Phone: [phone] Fax: [phone]   Both request has this pharmacy listed not [facility]. Also the patient would like a call back from whomever sent it to the wrong pharmacy.

## 2017-12-07 NOTE — Telephone Encounter (Signed)
Patient is requesting a refill of the following medications: Requested Prescriptions   Pending Prescriptions Disp Refills  . sildenafil (REVATIO) 20 MG tablet 10 tablet     Sig: Take 1-2 tablets (20-40 mg total) by mouth as needed.    Date of patient request: 12/02/2017 Last office visit: 11/09/2017 Date of last refill:  Last refill amount: 20-40 Follow up time period per chart:

## 2017-12-07 NOTE — Telephone Encounter (Signed)
Copied from [place] [id number]. Topic: Quick Communication - Rx Refill/Question >> Dec 02, 2017  3:22 PM Synthia Innocent wrote: Medication: sildenafil (REVATIO) 20 MG tablet   Has the patient contacted their pharmacy? Yes.   (Agent: If no, request that the patient contact the pharmacy for the refill.) (Agent: If yes, when and what did the pharmacy advise?)  Preferred Pharmacy (with phone number or street name): [facility]   Agent: Please be advised that RX refills may take up to 3 business days. We ask that you follow-up with your pharmacy. >> Dec 06, 2017  5:01 PM Neva Seat wrote: Pt checking back on refill.  Please call pt to let him know on the Rx. >> Dec 06, 2017  5:26 PM Percell Belt A wrote:  Pt called again about this med.  He stated he would like a call back when this has been ok'd and called in?     [phone]- best number

## 2017-12-07 NOTE — Telephone Encounter (Signed)
Copied from [place] [phone]. Topic: Quick Communication - Rx Refill/Question >> Dec 02, 2017  3:22 PM Synthia Innocent wrote: Medication: sildenafil (REVATIO) 20 MG tablet   Has the patient contacted their pharmacy? Yes.   (Agent: If no, request that the patient contact the pharmacy for the refill.) (Agent: If yes, when and what did the pharmacy advise?)  Preferred Pharmacy (with phone number or street name): [facility]   Agent: Please be advised that RX refills may take up to 3 business days. We ask that you follow-up with your pharmacy. >> Dec 06, 2017  5:01 PM Neva Seat wrote: Pt checking back on refill.  Please call pt to let him know on the Rx. >> Dec 06, 2017  5:26 PM Percell Belt A wrote:  Pt called again about this med.  He stated he would like a call back when this has been ok'd and called in?     [phone]- best number

## 2017-12-07 NOTE — Telephone Encounter (Signed)
Copied from [place] [id number]. Topic: Quick Communication - Rx Refill/Question >> Dec 07, 2017  4:21 PM Judyann Munson wrote: Medication: sildenafil (REVATIO) 20 MG tablet   Preferred Pharmacy (with phone number or street name): [name] [place] - [place], [place] - [address] [phone] (Phone) [phone] (Fax)  The patient is calling to advise the medication was sent to the wrong pharmacy

## 2017-12-09 ENCOUNTER — Other Ambulatory Visit: Payer: Self-pay

## 2017-12-09 ENCOUNTER — Ambulatory Visit: Payer: BC Managed Care – PPO | Admitting: Physician Assistant

## 2017-12-09 ENCOUNTER — Encounter: Payer: Self-pay | Admitting: Physician Assistant

## 2017-12-09 VITALS — BP 112/70 | HR 63 | Temp 97.7°F | Resp 18 | Ht 70.35 in | Wt 172.2 lb

## 2017-12-09 DIAGNOSIS — I10 Essential (primary) hypertension: Secondary | ICD-10-CM | POA: Diagnosis not present

## 2017-12-09 DIAGNOSIS — R7989 Other specified abnormal findings of blood chemistry: Secondary | ICD-10-CM | POA: Diagnosis not present

## 2017-12-09 LAB — BASIC METABOLIC PANEL
BUN/Creatinine Ratio: 11 (ref 10–24)
BUN: 15 mg/dL (ref 8–27)
CHLORIDE: 95 mmol/L — AB (ref 96–106)
CO2: 27 mmol/L (ref 20–29)
CREATININE: 1.36 mg/dL — AB (ref 0.76–1.27)
Calcium: 9.3 mg/dL (ref 8.6–10.2)
GFR calc Af Amer: 63 mL/min/{1.73_m2} (ref >59)
GFR calc non Af Amer: 54 mL/min/{1.73_m2} — ABNORMAL LOW (ref >59)
GLUCOSE: 139 mg/dL — AB (ref 65–99)
POTASSIUM: 3.7 mmol/L (ref 3.5–5.2)
Sodium: 137 mmol/L (ref 134–144)

## 2017-12-09 MED ORDER — SILDENAFIL CITRATE 20 MG PO TABS: 20.0000 mg | ORAL_TABLET | ORAL | 0 refills | Status: DC | PRN

## 2017-12-09 NOTE — Patient Instructions (Addendum)
[facility], [place], [place] [address] Phone: [phone]   If your labs come back normal you will be ok for surgery    IF you received an x-ray today, you will receive an invoice from [facility]. Please contact [facility] at [phone] with questions or concerns regarding your invoice.   IF you received labwork today, you will receive an invoice from [facility]. Please contact [facility] at [phone] with questions or concerns regarding your invoice.   Our billing staff will not be able to assist you with questions regarding bills from these companies.  You will be contacted with the lab results as soon as they are available. The fastest way to get your results is to activate your My Chart account. Instructions are located on the last page of this paperwork. If you have not heard from [place] regarding the results in 2 weeks, please contact this office.

## 2017-12-09 NOTE — Telephone Encounter (Signed)
Pt calling to f/up on request to re-send sildenafil (REVATIO) 20 MG tablet to:  [name], [place] - [place], [place] - [address]         [phone] (Phone) [phone] (Fax)     Pt: [phone]

## 2017-12-09 NOTE — Addendum Note (Signed)
Addended by: Mancel Bale on: 12/09/2017 09:23 AM   Modules accepted: Orders

## 2017-12-09 NOTE — Telephone Encounter (Signed)
Rx resent.

## 2017-12-09 NOTE — Progress Notes (Signed)
Gary Maddox  MRN: [id number] DOB: October 23, 1950  PCP: Gary Bale, PA-C  Chief Complaint  Patient presents with  . Procedure    here to talk about surgery needs [facility]en[facility]     Subjective:  Pt presents to clinic for recheck of labs.  He is not sure which medications he takes - his wife was brought back to review his medications.  He is back taking the tribenzor per wife but he did stop it for a few days around when his kidney function was having problems.  He has been back on it "for a while"  He is still having back and chest wall pain from his WC injury.  History is obtained by patient and wife.  Review of Systems  Constitutional: Negative for chills and fever.  Eyes: Negative for visual disturbance.  Respiratory: Negative for cough and shortness of breath.   Cardiovascular: Negative for chest pain, palpitations and leg swelling.  Neurological: Negative for dizziness, light-headedness and headaches.    Patient Active Problem List   Diagnosis Date Noted  . Memory deficit 06/25/2017  . Chronic back pain 06/25/2017  . Type 2 diabetes mellitus without complication, without long-term current use of insulin (Gary Maddox) 10/05/2016  . Elevated LDL cholesterol level 10/05/2016  . 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 Essential hypertension 12/21/2006  . GERD 12/21/2006    Current Outpatient Medications on File Prior to Visit  Medication Sig Dispense Refill  . carvedilol (COREG) 12.5 MG tablet TAKE 1 TABLET(12.5 MG) BY MOUTH TWICE DAILY WITH A MEAL 60 tablet 0  . sildenafil (REVATIO) 20 MG tablet Take 1-2 tablets (20-40 mg total) by mouth as needed. 10 tablet 0  . silver sulfADIAZINE (SILVADENE) 1 % cream Apply 1 application topically daily. 50 g 1  . traMADol (ULTRAM) 50 MG tablet Take 50 mg by mouth every 6 (six) hours as needed. for pain  0  . gabapentin (NEURONTIN) 300 MG capsule TK  1 C PO BID  0  . meloxicam (MOBIC) 15 MG tablet TK 1 T PO QD WF  0  . Olmesartan-amLODIPine-HCTZ 40-10-25 MG TABS TK 1 T PO QD  0   No current facility-administered medications on file prior to visit.     No Known Allergies  Past Medical History:  Diagnosis Date  . ABUSE, ALCOHOL, IN REMISSION 12/24/2006   Qualifier: Diagnosis of  By: Gary Quan MD, Gary Maddox    . Diabetes mellitus without complication ([place])   . Elevated LDL cholesterol level 10/05/2016  . Essential hypertension 12/21/2006   Qualifier: Diagnosis of  By: Gary Quan MD, Gary Maddox    . GERD 12/21/2006   Qualifier: Diagnosis of  By: Gary Quan MD, Gary Maddox    . 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 Smoking status: Former Smoker    Types: Cigarettes  . Smokeless tobacco: Never Used  . Tobacco comment: quit a long time ago about 25 years ago  Substance Use Topics  . Alcohol use: Yes    Comment: 1/5 of liquor  . Drug use: No   family history includes Hypertension in his father and mother.     Objective:  BP 112/70   Pulse 63   Temp 97.7 F (36.5 C) (Oral)   Resp 18   Ht 5' 10.35" (1.787 m)   Wt 172 lb 3.2 oz (78.1 kg)   SpO2 99%   BMI 24.46 kg/m  Body mass index is 24.46 kg/m.  Wt Readings from Last 3 Encounters:  12/09/17 172 lb 3.2 oz (78.1 kg)  11/09/17 176 lb 9.6 oz (80.1 kg)  10/20/17 171 lb 3.2 oz (77.7 kg)    Physical Exam  Constitutional: He is oriented to person, place, and time. He appears well-developed and well-nourished.  HENT:  Head: Normocephalic and atraumatic.  Right Ear: External ear normal.  Left Ear: External ear normal.  Eyes:  Conjunctivae are normal.  Neck: Normal range of motion.  Cardiovascular: Normal rate, regular rhythm, normal heart sounds and intact distal pulses.  Pulmonary/Chest: Effort normal and breath sounds normal. He has no wheezes.  Musculoskeletal:       Right lower leg: He exhibits no edema.       Left lower leg: He exhibits no edema.  Neurological: He is alert and oriented to person, place, and time.  Skin: Skin is warm and dry.  Psychiatric: He has a normal mood and affect. His behavior is normal. Judgment and thought content normal.  Vitals reviewed.   Assessment and Plan :  Essential hypertension - Plan: Basic metabolic panel - the fact that the patient is taking his tribenzor makes sense with his good BP control.  He should continue that.  Elevated serum creatinine - Plan: Basic metabolic panel - check 1 more time - for the last 2 months this has been normal - no explanation of why he had decreased kidney function for a week but it has normalized.  If this labs come back normal - I will call ortho and tell them I feel he is ok for surgery from that standpoint.  Patient and wife verbalized to me that they understand the following: diagnosis, what is being done for them, what to expect and what should be done at home.  Their questions have been answered.  See after visit summary for patient specific instructions.  Windell Hummingbird PA-C  [facility] 12/09/2017 9:19 AM  Please note: Portions of this report may have been transcribed using dragon voice recognition software. Every effort was made to ensure accuracy; however, inadvertent computerized transcription errors may be present.

## 2017-12-09 NOTE — Telephone Encounter (Signed)
Call placed to pt.  Spoke with pt's wife.  Advised that the Sildenafil had been ordered on 12/07/17, and was electronically sent through to [facility] on [facility].  Per pt's wife, he doesn't want this prescription ordered at [facility].  Requested that the above medication be ordered at [facility] in [place].  Advised will make the requested change in pharmacy.   Call placed to [facility].  Left voice message to cancel the prescription for Sildenafil that was ordered on 7/23, as it will be ordered through another pharmacy per pt. request.     Sent Sildenafil Rx to [facility], per pt. req.

## 2017-12-13 ENCOUNTER — Ambulatory Visit: Payer: BC Managed Care – PPO | Admitting: Physician Assistant

## 2017-12-13 ENCOUNTER — Other Ambulatory Visit: Payer: Self-pay

## 2017-12-13 ENCOUNTER — Encounter: Payer: Self-pay | Admitting: Physician Assistant

## 2017-12-13 ENCOUNTER — Ambulatory Visit: Payer: Self-pay

## 2017-12-13 ENCOUNTER — Other Ambulatory Visit: Payer: Self-pay | Admitting: Physician Assistant

## 2017-12-13 VITALS — BP 120/62 | HR 65 | Temp 99.2°F | Resp 18 | Ht 70.35 in | Wt 171.2 lb

## 2017-12-13 DIAGNOSIS — M545 Low back pain: Secondary | ICD-10-CM

## 2017-12-13 DIAGNOSIS — G8929 Other chronic pain: Secondary | ICD-10-CM | POA: Diagnosis not present

## 2017-12-13 DIAGNOSIS — R1013 Epigastric pain: Secondary | ICD-10-CM

## 2017-12-13 DIAGNOSIS — I10 Essential (primary) hypertension: Secondary | ICD-10-CM

## 2017-12-13 MED ORDER — RANITIDINE HCL 150 MG PO TABS: 150.0000 mg | ORAL_TABLET | Freq: Two times a day (BID) | ORAL | 0 refills | Status: DC

## 2017-12-13 NOTE — Progress Notes (Signed)
Gary Maddox  MRN: [id number] DOB: 1950-11-04  PCP: Mancel Bale, PA-C  Chief Complaint  Patient presents with  . Abdominal Pain    hurts in middle of stomach tried some OTC meds x5days     Subjective:  Pt presents to clinic for 4-5 days of abdominal pain. No nausea.  No stool change.  No F/C. He is not sleeping due to the pain.  Laying down does not increase his pain.  No ibuprofen. NO change in pain with eating but patient has decreased eating because of the pain.    He tried mylanta and milk of magnesium - helped a little with his pain.  Does not feel constipated. No increase in burping or passing gas.  No sick contacts.    History is obtained by patient.  Review of Systems  Constitutional: Negative for chills and fever.  Gastrointestinal: Positive for abdominal pain (burning sensation). Negative for constipation and diarrhea.    Patient Active Problem List   Diagnosis Date Noted  . Memory deficit 06/25/2017  . Chronic back pain 06/25/2017  . Type 2 diabetes mellitus without complication, without long-term current use of insulin ([place]) 10/05/2016  . Elevated LDL cholesterol level 10/05/2016  . 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 Essential hypertension 12/21/2006  . GERD 12/21/2006    Current Outpatient Medications on File Prior to Visit  Medication Sig Dispense Refill  . carvedilol (COREG) 12.5 MG tablet TAKE 1 TABLET(12.5 MG) BY MOUTH TWICE DAILY WITH A MEAL 60 tablet 0  . gabapentin (NEURONTIN) 300 MG capsule TK 1 C PO BID  0  . meloxicam (MOBIC) 15 MG tablet TK 1 T PO QD WF  0  . Olmesartan-amLODIPine-HCTZ 40-10-25 MG TABS TK 1 T PO QD  0  . sildenafil (REVATIO) 20 MG tablet Take 1-2 tablets (20-40 mg total) by mouth as needed. 30 tablet 0  . silver sulfADIAZINE (SILVADENE) 1 % cream Apply 1 application topically daily. 50 g 1  . traMADol (ULTRAM) 50 MG tablet Take 50 mg by mouth every 6 (six)  hours as needed. for pain  0   No current facility-administered medications on file prior to visit.     No Known Allergies  Past Medical History:  Diagnosis Date  . ABUSE, ALCOHOL, IN REMISSION 12/24/2006   Qualifier: Diagnosis of  By: Leward Quan MD, Pamala Hurry    . Diabetes mellitus without complication ([place])   . Elevated LDL cholesterol level 10/05/2016  . Essential hypertension 12/21/2006   Qualifier: Diagnosis of  By: Leward Quan MD, Pamala Hurry    . GERD 12/21/2006   Qualifier: Diagnosis of  By: Leward Quan MD, Pamala Hurry    . 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 Smoking status: Former Smoker    Types: Cigarettes  . Smokeless tobacco: Never Used  . Tobacco comment: quit a long time ago about 25 years ago  Substance Use Topics  . Alcohol use: Yes    Comment:  1/5 of liquor  . Drug use: No   family history includes Hypertension in his father and mother.     Objective:  BP 120/62   Pulse 65   Temp 99.2 F (37.3 C) (Oral)   Resp 18   Ht 5' 10.35" (1.787 m)   Wt 171 lb 3.2 oz (77.7 kg)   SpO2 98%   BMI 24.32 kg/m  Body mass index is 24.32 kg/m.  Wt Readings from Last 3 Encounters:  12/13/17 171 lb 3.2 oz (77.7 kg)  12/09/17 172 lb 3.2 oz (78.1 kg)  11/09/17 176 lb 9.6 oz (80.1 kg)    Physical Exam  Constitutional: He is oriented to person, place, and time.  HENT:  Head: Normocephalic and atraumatic.  Right Ear: External ear normal.  Left Ear: External ear normal.  Eyes: Conjunctivae are normal.  Neck: Normal range of motion.  Cardiovascular: Normal rate, regular rhythm and normal heart sounds.  No murmur  heard. Pulmonary/Chest: Effort normal and breath sounds normal. He has no wheezes.  Abdominal: Soft. Normal appearance, normal aorta and bowel sounds are normal. There is tenderness in the epigastric area. There is no guarding, no tenderness at McBurney's point and negative Murphy's sign.  During exam gas movement causes pain that patient can identify as the pain he is having.  He also has the same pain with epigastric palpation that is the burning pain he describes.  Neurological: He is alert and oriented to person, place, and time.  Skin: Skin is warm and dry.  Psychiatric: Judgment normal.    Assessment and Plan :  Abdominal pain, epigastric - Plan: ranitidine (ZANTAC) 150 MG tablet Suspect patient has mild gastritis with some gas.  He will use Gas-X over-the-counter and Zantac for the next 2 weeks to control his symptoms.  He was encouraged to have a bland diet to help lessen his symptoms.  Chronic right-sided low back pain, with sciatica presence unspecified -kidney function seems improved we will talk with Dr. Ronalee Belts about okay for surgery  Patient verbalized to me that they understand the following: diagnosis, what is being done for them, what to expect and what should be done at home.  Their questions have been answered.  See after visit summary for patient specific instructions.  Windell Hummingbird PA-C  [facility] 12/14/2017 10:06 AM  Please note: Portions of this report may have been transcribed using dragon voice recognition software. Every effort was made to ensure accuracy; however, inadvertent computerized transcription errors may be present.

## 2017-12-13 NOTE — Telephone Encounter (Signed)
Pt. Reports abdominal pain x 1 week. Reports pain is at his "belly button - a burning pain. " "MOM helps a little." Pain comes and goes. Reports regular BM's. No vomiting or diarrhea. Pain does not radiate.States "it was hurting when I saw the doctor last week, but I didn't tell her." Appointment made for today. Instructed if symptoms worsen to go to ED. Verbalizes understanding. Reason for Disposition . [1] MODERATE pain (e.g., interferes with normal activities) AND [2] pain comes and goes (cramps) AND [3] present > 24 hours  (Exception: pain with Vomiting or Diarrhea - see that Guideline)  Answer Assessment - Initial Assessment Questions 1. LOCATION: "Where does it hurt?"      By the belly button 2. RADIATION: "Does the pain shoot anywhere else?" (e.g., chest, back)     No 3. ONSET: "When did the pain begin?" (Minutes, hours or days ago)      Last week 4. SUDDEN: "Gradual or sudden onset?"     Sudden 5. PATTERN "Does the pain come and go, or is it constant?"    - If constant: "Is it getting better, staying the same, or worsening?"      (Note: Constant means the pain never goes away completely; most serious pain is constant and it progresses)     - If intermittent: "How long does it last?" "Do you have pain now?"     (Note: Intermittent means the pain goes away completely between bouts)     MOM helps a little - comes and goes 6. SEVERITY: "How bad is the pain?"  (e.g., Scale 1-10; mild, moderate, or severe)    - MILD (1-3): doesn't interfere with normal activities, abdomen soft and not tender to touch     - MODERATE (4-7): interferes with normal activities or awakens from sleep, tender to touch     - SEVERE (8-10): excruciating pain, doubled over, unable to do any normal activities       8 7. RECURRENT SYMPTOM: "Have you ever had this type of abdominal pain before?" If so, ask: "When was the last time?" and "What happened that time?"      No 8. CAUSE: "What do you think is causing the  abdominal pain?"     Unsure 9. RELIEVING/AGGRAVATING FACTORS: "What makes it better or worse?" (e.g., movement, antacids, bowel movement)     MOM 10. OTHER SYMPTOMS: "Has there been any vomiting, diarrhea, constipation, or urine problems?"       No  Protocols used: ABDOMINAL PAIN - MALE-A-AH

## 2017-12-13 NOTE — Patient Instructions (Addendum)
Get Gas X for gas  Stop meloxicam as it may be bothering your stomach  Start Zantac for 2 weeks you can stop it after that time if you pain has gotten better  Bland foods - no spicy or greasy foods for about 7-10 days    IF you received an x-ray today, you will receive an invoice from [facility]. Please contact [facility] at [phone] with questions or concerns regarding your invoice.   IF you received labwork today, you will receive an invoice from [facility]. Please contact [facility] at [phone] with questions or concerns regarding your invoice.   Our billing staff will not be able to assist you with questions regarding bills from these companies.  You will be contacted with the lab results as soon as they are available. The fastest way to get your results is to activate your My Chart account. Instructions are located on the last page of this paperwork. If you have not heard from [place] regarding the results in 2 weeks, please contact this office.

## 2017-12-14 ENCOUNTER — Encounter: Payer: Self-pay | Admitting: Physician Assistant

## 2017-12-14 NOTE — Telephone Encounter (Signed)
Olmesartan/amlod/HCTZ 40-10-25 mg refill request  LOV 12/09/17 with Windell Hummingbird.  LR:  Non listed.   Was for start date of 11/16/17  With no refills when looked up.  Unable to determine if he is on this or not.  [id number] - [place], [place]

## 2017-12-15 ENCOUNTER — Encounter: Payer: Self-pay | Admitting: *Deleted

## 2017-12-17 ENCOUNTER — Ambulatory Visit: Payer: BC Managed Care – PPO | Admitting: Physician Assistant

## 2017-12-17 ENCOUNTER — Telehealth: Payer: Self-pay | Admitting: Physician Assistant

## 2017-12-17 NOTE — Telephone Encounter (Signed)
Copied from [place] [id number]. Topic: Quick Communication - See Telephone Encounter >> Dec 17, 2017 12:41 PM Rutherford Nail, NT wrote: CRM for notification. See Telephone encounter for: 12/17/17. Butch Penny with [facility] calling to check the status of the patient's kidney function. States that he had a surgery scheduled with [facility] and it was cancelled by Windell Hummingbird because there was an issue with the patient's kidney function. Would like to get an update on this. Please advise. CB#: [phone]

## 2017-12-22 ENCOUNTER — Telehealth: Payer: Self-pay | Admitting: Physician Assistant

## 2017-12-22 NOTE — Telephone Encounter (Signed)
Attempted call to Dr Ronalee Belts that patient is having no kidney issues at this time.  No one picked up at his office after 10 mins of ringing.

## 2017-12-22 NOTE — Telephone Encounter (Signed)
Copied from [place] [id number]. Topic: Inquiry >> Dec 22, 2017  1:08 PM Margot Ables wrote: Reason for CRM: Pt came in 12/13/17 and advised his workers comp nurse case mgr as well as the scheduler for Dr. Ronalee Belts that he was given ok for disc fusion surgery. Pt brought in a form to be completed but no one has received a copy of this form and pt cannot be scheduled for surgery until it is received. This is a workers comp case and they are requesting notification on approval for clearance.

## 2017-12-23 NOTE — Telephone Encounter (Signed)
LMOM for Butch Penny (surgical coordinator) that his kidney function has returned to normal and from that perspective he is ok to have surgery.

## 2017-12-28 ENCOUNTER — Telehealth: Payer: Self-pay | Admitting: Vascular Surgery

## 2017-12-28 ENCOUNTER — Other Ambulatory Visit: Payer: Self-pay | Admitting: *Deleted

## 2017-12-28 NOTE — Telephone Encounter (Signed)
sch appt lvm 02/08/18 930am f/u MD

## 2018-01-04 ENCOUNTER — Other Ambulatory Visit: Payer: Self-pay | Admitting: Physician Assistant

## 2018-01-04 ENCOUNTER — Telehealth: Payer: Self-pay | Admitting: Physician Assistant

## 2018-01-04 DIAGNOSIS — I10 Essential (primary) hypertension: Secondary | ICD-10-CM

## 2018-01-04 NOTE — Telephone Encounter (Signed)
Patients wife needs FMLA forms completed by Windell Hummingbird so she can miss work to take him to and from Dr Appts. I have completed what I could from the [place] notes and highlighted the areas I was not sure about I will place the forms in Sarah's box on 01/04/18. Please return to the FMLA/Disability desk within 5-7 business days. Thank you!

## 2018-01-06 NOTE — Telephone Encounter (Signed)
Paperwork was placed in providers box on 8/20 our protocol is 5-7 business days to complete paperwork--patient was informed of this when paperwork was dropped off.

## 2018-01-06 NOTE — Telephone Encounter (Signed)
Wife calling back to follow up on the FMLA.  Lucile Crater out of office. She state she needs asap, if Judson Roch can do when she returns.

## 2018-01-09 ENCOUNTER — Other Ambulatory Visit: Payer: Self-pay | Admitting: Physician Assistant

## 2018-01-09 DIAGNOSIS — R1013 Epigastric pain: Secondary | ICD-10-CM

## 2018-01-10 NOTE — Telephone Encounter (Signed)
Patient called on home phone, line busy. Cell phone called and it was another person on the voice mail greeting, no message left. It was noted last OV note 12/13/17 that the patient is to stop taking Zantac after 2 weeks if the abdominal pain was better, refill refused due to this, patient will need an appointment to follow up.

## 2018-01-11 NOTE — Telephone Encounter (Signed)
Done

## 2018-01-11 NOTE — Telephone Encounter (Signed)
Paperwork scanned and faxed on 01/11/18

## 2018-01-14 ENCOUNTER — Telehealth: Payer: Self-pay | Admitting: Physician Assistant

## 2018-01-14 NOTE — Telephone Encounter (Unsigned)
Copied from [place] [id number]. Topic: Inquiry >> Jan 14, 2018  5:02 PM Blase Mess A wrote: Medication: sildenafil (REVATIO) 20 MG tablet [[id number]]  Pharmacy faxed refill request earlier in the week no response.  Has the patient contacted their pharmacy? Yes  (Agent: If no, request that the patient contact the pharmacy for the refill.) (Agent: If yes, when and what did the pharmacy advise?)  Preferred Pharmacy (with phone number or street name)[facility] [place] - [place], [place] - [address] [place] [address] Phone: [phone] Fax: [phone] Not a 24 hour pharmacy; exact hours not known.    Agent: Please be advised that RX refills may take up to 3 business days. We ask that you follow-up with your pharmacy. Reason for CRM: ***

## 2018-01-16 ENCOUNTER — Telehealth: Payer: Self-pay

## 2018-01-16 NOTE — Telephone Encounter (Signed)
Copied from [place] [id number]. Topic: Inquiry >> Jan 14, 2018  5:02 PM Blase Mess A wrote: Medication: sildenafil (REVATIO) 20 MG tablet [[id number]]  Pharmacy faxed refill request earlier in the week no response.  Has the patient contacted their pharmacy? Yes  (Agent: If no, request that the patient contact the pharmacy for the refill.) (Agent: If yes, when and what did the pharmacy advise?)  Preferred Pharmacy (with phone number or street name)[facility] [place] - [place], [place] - [address] [place] [address] Phone: [phone] Fax: [phone] Not a 24 hour pharmacy; exact hours not known.    Agent: Please be advised that RX refills may take up to 3 business days. We ask that you follow-up with your pharm   Message sent to Windell Hummingbird

## 2018-01-19 MED ORDER — SILDENAFIL CITRATE 20 MG PO TABS: 20.0000 mg | ORAL_TABLET | ORAL | 0 refills | Status: DC | PRN

## 2018-01-19 NOTE — Telephone Encounter (Signed)
° °  Pt called about his refill and said ghe would like to have it today

## 2018-01-19 NOTE — Addendum Note (Signed)
Addended by: Mancel Bale on: 01/19/2018 09:38 PM   Modules accepted: Orders

## 2018-01-21 ENCOUNTER — Telehealth: Payer: Self-pay | Admitting: Physician Assistant

## 2018-01-21 NOTE — Telephone Encounter (Signed)
Copied from [place]. Topic: Quick Communication - Rx Refill/Question >> Jan 21, 2018  5:29 PM Mcneil, Ja-Kwan wrote: Medication: gabapentin (NEURONTIN) 300 MG capsule and meloxicam (MOBIC) 15 MG tablet  Has the patient contacted their pharmacy?  no  Preferred Pharmacy (with phone number or street name): [facility] [phone] - [place], [place] - [address] [phone] (Phone) [phone] (Fax)  Agent: Please be advised that RX refills may take up to 3 business days. We ask that you follow-up with your pharmacy.

## 2018-01-21 NOTE — Telephone Encounter (Signed)
meloxicam refill Last Refill:12/01/17 Last OV: 09/17/17 PCP: Windell Hummingbird Pharmacy:[facility] [address]  Gabapentin is a historical med

## 2018-01-24 NOTE — Telephone Encounter (Signed)
These come from his back provider.

## 2018-01-24 NOTE — Telephone Encounter (Signed)
Patient is requesting a refill of the following medications: Requested Prescriptions   Pending Prescriptions Disp Refills  . gabapentin (NEURONTIN) 300 MG capsule 60 capsule 0    Sig: TK 1 C PO BID  . meloxicam (MOBIC) 15 MG tablet 30 tablet 0    Sig: TK 1 T PO QD WF    Date of patient request: 01/21/2018 Last office visit: 12/13/2017 Date of last refill: 11/12/2017,12/01/2017 Last refill amount:  Follow up time period per chart:

## 2018-01-28 ENCOUNTER — Telehealth: Payer: Self-pay | Admitting: *Deleted

## 2018-01-28 NOTE — Telephone Encounter (Signed)
Patient wife called back regarding refill on meloxicam (MOBIC) 15 MG tablet  and gabapentin (NEURONTIN) 300 MG capsule. Called and spoke with Almyra Free? Who stated that it will be addressed today.

## 2018-01-28 NOTE — Telephone Encounter (Signed)
pec called patient is trying to get mobic, gabapentin filled looks a message was sent to Judson Roch on 9-9 .  I do not see that she prescribed

## 2018-01-31 NOTE — Telephone Encounter (Signed)
I have reviewed chart. It does not seem that Gary Maddox ever filled them. I wonder if meds were being prescribed by her orthopedist. Also Gary Maddox had advised her to stop meloxican as she was having GI upset. Also with her decreased and fluctuating kidney function she really should avoid NSAIDs. Therefore denying meds. Thanks

## 2018-01-31 NOTE — Telephone Encounter (Signed)
Gary Maddox stated he needs to get these from his back provider that prescribed these.

## 2018-02-03 ENCOUNTER — Other Ambulatory Visit: Payer: Self-pay

## 2018-02-03 ENCOUNTER — Emergency Department (HOSPITAL_COMMUNITY)
Admission: EM | Admit: 2018-02-03 | Discharge: 2018-02-03 | Disposition: A | Discharge status: Home / Self Care | Payer: BC Managed Care – PPO | Attending: Emergency Medicine | Admitting: Emergency Medicine

## 2018-02-03 ENCOUNTER — Encounter (HOSPITAL_COMMUNITY): Payer: Self-pay

## 2018-02-03 ENCOUNTER — Emergency Department (HOSPITAL_COMMUNITY): Payer: BC Managed Care – PPO

## 2018-02-03 DIAGNOSIS — Z79899 Other long term (current) drug therapy: Secondary | ICD-10-CM | POA: Insufficient documentation

## 2018-02-03 DIAGNOSIS — Z87891 Personal history of nicotine dependence: Secondary | ICD-10-CM | POA: Diagnosis not present

## 2018-02-03 DIAGNOSIS — E119 Type 2 diabetes mellitus without complications: Secondary | ICD-10-CM | POA: Diagnosis not present

## 2018-02-03 DIAGNOSIS — M545 Low back pain: Secondary | ICD-10-CM | POA: Insufficient documentation

## 2018-02-03 DIAGNOSIS — M5410 Radiculopathy, site unspecified: Secondary | ICD-10-CM

## 2018-02-03 DIAGNOSIS — I10 Essential (primary) hypertension: Secondary | ICD-10-CM | POA: Diagnosis not present

## 2018-02-03 MED ORDER — DICLOFENAC SODIUM 1 % TD GEL: 4.0000 g | Freq: Four times a day (QID) | TRANSDERMAL | 0 refills | Status: DC

## 2018-02-03 MED ORDER — PREDNISONE 20 MG PO TABS: ORAL_TABLET | ORAL | 0 refills | Status: DC

## 2018-02-03 NOTE — ED Triage Notes (Signed)
Pt states that he had a fall in 2018 at work, reports he has been out of work since. Pt is supposed to be taking gabapentin, myloxicam, and tramadol. Pt has only been able to get his tramadol over the past month but has been out of the others r/t a change in doctors.  Since being out over the past 3 weeks pt has had increased falls, and muscle pain and weakness in left hip/ back pt is scheduled for back surgery next month.

## 2018-02-03 NOTE — ED Provider Notes (Signed)
[facility] EMERGENCY DEPARTMENT Provider Note   CSN: [id number] Arrival date & time: 02/03/18  3614     History   Chief Complaint Chief Complaint  Patient presents with  . Back Pain    HPI Gary Maddox is a 67 y.o. male.  The history is provided by the patient and medical records. No language interpreter was used.  Back Pain       67 year old male with hx HTN, HepC here with difficulty walking.  Patient report he had a major fall a year ago and since then he has been using a cane to walk and has recurrent low back pain.  For the past 3 weeks he noticed worsening back pain especially with ambulation.  He was initially on meloxicam, gabapentin, and tramadol for symptom however due to switching of doctors, he does not have meloxicam and gabapentin on board.  Felt that this makes his pain worse.  Endorsed increased muscle pain to the left lower extremity with weakness.  Endorses chronic numbness and tingling sensation to the left lower extremity since the fall of last year.  He report that he is scheduled to have a disc fusion surgery on October 9 but unable to bear the weight until then.  Is still able to drive.  He denies any preceding factors such as recent fall, syncope, or any recent infection.  No complaints of fever, chills, shortness of breath, abdominal pain, urinary or bowel incontinence or saddle anesthesia.   Past Medical History:  Diagnosis Date  . ABUSE, ALCOHOL, IN REMISSION 12/24/2006   Qualifier: Diagnosis of  By: Leward Quan MD, Pamala Hurry    . Diabetes mellitus without complication ([place])   . Elevated LDL cholesterol level 10/05/2016  . Essential hypertension 12/21/2006   Qualifier: Diagnosis of  By: Leward Quan MD, Pamala Hurry    . GERD 12/21/2006   Qualifier: Diagnosis of  By: Leward Quan MD, Pamala Hurry    . HEPATITIS C, CHRONIC VIRAL, W/O HEPATIC COMA 12/24/2006   Qualifier: Diagnosis of  By: Leward Quan MD, [facility]      Patient Active Problem List   Diagnosis  Date Noted  . Memory deficit 06/25/2017  . Chronic back pain 06/25/2017  . Type 2 diabetes mellitus without complication, without long-term current use of insulin ([place]) 10/05/2016  . Elevated LDL cholesterol level 10/05/2016  . 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 Essential hypertension 12/21/2006  . GERD 12/21/2006    Past Surgical History:  Procedure Laterality Date  . FOOT SURGERY    . KNEE SURGERY          Home Medications    Prior to Admission medications   Medication Sig Start Date End Date Taking? Authorizing Provider  carvedilol (COREG) 12.5 MG tablet TAKE 1 TABLET(12.5 MG) BY MOUTH TWICE DAILY WITH A MEAL 01/04/18   Weber, Sarah L, PA-C  gabapentin (NEURONTIN) 300 MG capsule TK 1 C PO BID 11/12/17   [provider]  meloxicam (MOBIC) 15 MG tablet TK 1 T PO QD WF 12/01/17   [provider]  Olmesartan-amLODIPine-HCTZ 40-10-25 MG TABS TK 1 T PO QD 11/16/17   [provider]  Olmesartan-amLODIPine-HCTZ 40-10-25 MG TABS TAKE 1 TABLET BY MOUTH EVERY DAY 01/04/18   Weber, Damaris Hippo, PA-C  ranitidine (ZANTAC) 150 MG tablet Take 1 tablet (150 mg total) by mouth 2 (two) times daily. 12/13/17   Weber, Damaris Hippo, PA-C  sildenafil (REVATIO) 20 MG tablet Take  1-2 tablets (20-40 mg total) by mouth as needed. 01/19/18   Weber, Damaris Hippo, PA-C  silver sulfADIAZINE (SILVADENE) 1 % cream Apply 1 application topically daily. 11/30/16   Weber, Damaris Hippo, PA-C  traMADol (ULTRAM) 50 MG tablet Take 50 mg by mouth every 6 (six) hours as needed. for pain 09/14/17   [provider]    Family History Family History  Problem Relation Age of Onset  . Hypertension Mother   . Hypertension Father     Social History Social History   Tobacco Use  . Smoking status: Former Smoker    Types: Cigarettes  . Smokeless tobacco: Never Used  . Tobacco comment: quit a long time ago about 25 years ago  Substance Use Topics   . Alcohol use: Not Currently    Comment: 1/5 of liquor  . Drug use: No     Allergies   Patient has no known allergies.   Review of Systems Review of Systems  Musculoskeletal: Positive for back pain.  All other systems reviewed and are negative.    Physical Exam Updated Vital Signs BP 119/89   Pulse (!) 55   Temp (!) 97.5 F (36.4 C) (Oral)   Resp 17   Ht 6\' 1"  (1.854 m)   Wt 81.6 kg   SpO2 99%   BMI 23.75 kg/m   Physical Exam  Constitutional: He appears well-developed and well-nourished. No distress.  HENT:  Head: Atraumatic.  Eyes: Conjunctivae are normal.  Neck: Neck supple.  Cardiovascular: Normal rate and regular rhythm.  Pulmonary/Chest: Effort normal and breath sounds normal.  Abdominal: Soft. He exhibits no distension. There is no tenderness.  Musculoskeletal: He exhibits tenderness (Tenderness along lumbar spine, left parous spinal muscle as well as left lumbosacral region on palpation with negative straight leg raise.  Patellar deep tendon reflex intact bilaterally with intact distal pedal pulse.).  Neurological: He is alert.  Skin: No rash noted.  Psychiatric: He has a normal mood and affect.  Nursing note and vitals reviewed.    ED Treatments / Results  Labs (all labs ordered are listed, but only abnormal results are displayed) Labs Reviewed - No data to display  EKG None  Radiology Dg Lumbar Spine Complete  Result Date: 02/03/2018 CLINICAL DATA:  Worsening left hip and low back pain. No reported injury. EXAM: LUMBAR SPINE - COMPLETE 4+ VIEW COMPARISON:  Outside lumbar spine MRI 03/26/2017. Outside lumbar spine radiographs 03/12/2017. FINDINGS: This report assumes 5 non rib-bearing lumbar vertebrae. Lumbar vertebral body heights are preserved, with no fracture. Moderate multilevel lumbar degenerative disc disease, most prominent at L1-2 and L2-3. Stable minimal 2 mm retrolisthesis at L1-2, mild 4 mm retrolisthesis at L2-3, mild 3 mm  retrolisthesis at L4-5 and mild 3 mm anterolisthesis at L5-S1. Moderate bilateral lower lumbar facet arthropathy. No aggressive appearing focal osseous lesions. Abdominal aortic atherosclerosis. IMPRESSION: 1. No acute osseous abnormality. 2. Moderate multilevel lumbar degenerative disc disease, most prominent in the upper lumbar spine. 3. Stable mild multilevel lumbar spondylolisthesis. 4. Moderate lower lumbar facet arthropathy. Electronically Signed   By: Ilona Sorrel M.D.   On: 02/03/2018 08:49   Dg Hip Unilat W Or Wo Pelvis 2-3 Views Left  Result Date: 02/03/2018 CLINICAL DATA:  Worsening left hip pain, low back pain EXAM: DG HIP (WITH OR WITHOUT PELVIS) 2-3V LEFT COMPARISON:  None. FINDINGS: Moderate symmetric degenerative changes in the hips bilaterally with joint space narrowing and spurring. Subchondral sclerosis and cyst formation. SI joints are symmetric and unremarkable.  No acute bony abnormality. Specifically, no fracture, subluxation, or dislocation. IMPRESSION: Moderate symmetric degenerative changes in the hips bilaterally. No acute bony abnormality. Electronically Signed   By: Rolm Baptise M.D.   On: 02/03/2018 08:46    Procedures Procedures (including critical care time)  Medications Ordered in ED Medications - No data to display   Initial Impression / Assessment and Plan / ED Course  I have reviewed the triage vital signs and the nursing notes.  Pertinent labs & imaging results that were available during my care of the patient were reviewed by me and considered in my medical decision making (see chart for details).     BP 119/89   Pulse (!) 55   Temp (!) 97.5 F (36.4 C) (Oral)   Resp 17   Ht 6\' 1"  (1.854 m)   Wt 81.6 kg   SpO2 99%   BMI 23.75 kg/m    Final Clinical Impressions(s) / ED Diagnoses   Final diagnoses:  Radicular pain of left lower extremity    ED Discharge Orders         Ordered    predniSONE (DELTASONE) 20 MG tablet     02/03/18 0957     diclofenac sodium (VOLTAREN) 1 % GEL  4 times daily     02/03/18 0957         8:48 AM Patient with pain to his lower back and left lower extremity for the past year worsening in the past few weeks.  Pain suggestive of sciatic nerve irritation.  No evidence to suggest cauda equina symptoms.  Low suspicion for septic joint.  He is scheduled for lumbar fusion next month.  He was on gabapentin, meloxicam, and tramadol but was taken off from meloxicam due to worsening renal function.  Plan to avoid NSAIDs treatment however patient would benefit from a short course of steroid, and Lidoderm patch or Voltaren gel in the meantime.    X-ray of left hip and pelvis along with lumbar spine was obtained showing moderate symmetric degenerative changes without acute bony abnormality.  Patient discharged home with steroid and Voltaren gel. Care discussed with Dr. Lacinda Axon.    Domenic Moras, PA-C 02/03/18 1001    Nat Christen, MD 02/04/18 1218

## 2018-02-03 NOTE — Discharge Instructions (Addendum)
Your pain is likely due to sciatic nerve pain and degenerative changes of your back and hip.  Apply voltaren gel to affected area 4 times daily as needed for pain.  Take steroid as prescribed and follow up closely with your doctor for further care.

## 2018-02-03 NOTE — ED Notes (Signed)
Patient transported to X-ray 

## 2018-02-08 ENCOUNTER — Encounter: Payer: Self-pay | Admitting: Vascular Surgery

## 2018-02-08 ENCOUNTER — Ambulatory Visit (INDEPENDENT_AMBULATORY_CARE_PROVIDER_SITE_OTHER): Payer: BC Managed Care – PPO | Admitting: Vascular Surgery

## 2018-02-08 ENCOUNTER — Other Ambulatory Visit: Payer: Self-pay

## 2018-02-08 VITALS — BP 123/82 | HR 56 | Temp 97.5°F | Resp 20 | Ht 73.0 in | Wt 175.2 lb

## 2018-02-08 DIAGNOSIS — M5137 Other intervertebral disc degeneration, lumbosacral region: Secondary | ICD-10-CM
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
## 2018-02-11 ENCOUNTER — Other Ambulatory Visit: Payer: Self-pay | Admitting: Orthopedic Surgery

## 2018-02-11 ENCOUNTER — Ambulatory Visit: Payer: BC Managed Care – PPO | Admitting: Emergency Medicine

## 2018-02-16 NOTE — Pre-Procedure Instructions (Addendum)
Gary Maddox  02/16/2018      [id number] - [place], [place] - [address] [place] [address] Phone: [phone] Fax: [place], [place] - [address] [place] [address] Phone: [phone] Fax: [phone]  [name], [place] - [place], [place] - [address] [place] [address] Phone: [phone] Fax: [phone]    Your procedure is scheduled on February 23, 2018.  [facility]mitting at 530 AM.  Call this number if you have problems the morning of surgery:  [phone]   Remember:  Do not eat or drink after midnight.    Take these medicines the morning of surgery with A SIP OF WATER  Carvedilol (coreg) Gabapentin (neurontin) Eye drops-if needed Tramadol (ultram)  7 days prior to surgery STOP taking any diclofenac (voltaren) gel, Aspirin (unless otherwise instructed by your surgeon), Aleve, Naproxen, Ibuprofen, Motrin, Advil, Goody's, BC's, all herbal medications, fish oil, and all vitamins      How to Manage Your Diabetes Before and After Surgery  Why is it important to control my blood sugar before and after surgery? . Improving blood sugar levels before and after surgery helps healing and can limit problems. . A way of improving blood sugar control is eating a healthy diet by: o  Eating less sugar and carbohydrates o  Increasing activity/exercise o  Talking with your doctor about reaching your blood sugar goals . High blood sugars (greater than 180 mg/dL) can raise your risk of infections and slow your recovery, so you will need to focus on controlling your diabetes during the weeks before surgery. . Make sure that the doctor who takes care of your diabetes knows about your planned surgery including the date and  location.  How do I manage my blood sugar before surgery? . Check your blood sugar at least 4 times a day, starting 2 days before surgery, to make sure that the level is not too high or low. o Check your blood sugar the morning of your surgery when you wake up and every 2 hours until you get to the Short Stay unit. . If your blood sugar is less than 70 mg/dL, you will need to treat for low blood sugar: o Do not take insulin. o Treat a low blood sugar (less than 70 mg/dL) with  cup of clear juice (cranberry or apple), 4 glucose tablets, OR glucose gel. Recheck blood sugar in 15 minutes after treatment (to make sure it is greater than 70 mg/dL). If your blood sugar is not greater than 70 mg/dL on recheck, call [phone] o  for further instructions. . Report your blood sugar to the short stay nurse when you get to Short Stay.  . If you are admitted to the hospital after surgery: o Your blood sugar will be checked by the staff and you will probably be given insulin after surgery (instead of oral diabetes medicines) to make sure you have good blood sugar levels. o The goal for blood sugar control after surgery is 80-180 mg/dL.   WHAT DO I DO ABOUT MY DIABETES MEDICATION?   Marland Kitchen Do not take oral diabetes medicines (pills) the morning of surgery.  . THE NIGHT BEFORE SURGERY, take ___________ units of ___________insulin.       . THE MORNING OF SURGERY, take _____________ units  of __________insulin.  . The day of surgery, do not take other diabetes injectables, including Byetta (exenatide), Bydureon (exenatide ER), Victoza (liraglutide), or Trulicity (dulaglutide).  . If your CBG is greater than 220 mg/dL, you may take  of your sliding scale (correction) dose of insulin.   Reviewed and Endorsed by [facility] Patient Education Committee, August 2015   Do not wear jewelry  Do not wear lotions, powders, or cologn, or deodorant.  Men may shave face and neck.  Do not bring valuables to the  hospital.  [facility] [place] is not responsible for any belongings or valuables.  Contacts, dentures or bridgework may not be worn into surgery.  Leave your suitcase in the car.  After surgery it may be brought to your room.  For patients admitted to the hospital, discharge time will be determined by your treatment team.  Patients discharged the day of surgery will not be allowed to drive home.    [place]- Preparing For Surgery  Before surgery, you can play an important role. Because skin is not sterile, your skin needs to be as free of germs as possible. You can reduce the number of germs on your skin by washing with CHG (chlorahexidine gluconate) Soap before surgery.  CHG is an antiseptic cleaner which kills germs and bonds with the skin to continue killing germs even after washing.    Oral Hygiene is also important to reduce your risk of infection.  Remember - BRUSH YOUR TEETH THE MORNING OF SURGERY WITH YOUR REGULAR TOOTHPASTE  Please do not use if you have an allergy to CHG or antibacterial soaps. If your skin becomes reddened/irritated stop using the CHG.  Do not shave (including legs and underarms) for at least 48 hours prior to first CHG shower. It is OK to shave your face.  Please follow these instructions carefully.   1. Shower the NIGHT BEFORE SURGERY and the MORNING OF SURGERY with CHG.   2. If you chose to wash your hair, wash your hair first as usual with your normal shampoo.  3. After you shampoo, rinse your hair and body thoroughly to remove the shampoo.  4. Use CHG as you would any other liquid soap. You can apply CHG directly to the skin and wash gently with a scrungie or a clean washcloth.   5. Apply the CHG Soap to your body ONLY FROM THE NECK DOWN.  Do not use on open wounds or open sores. Avoid contact with your eyes, ears, mouth and genitals (private parts). Wash Face and genitals (private parts)  with your normal soap.  6. Wash thoroughly, paying special attention  to the area where your surgery will be performed.  7. Thoroughly rinse your body with warm water from the neck down.  8. DO NOT shower/wash with your normal soap after using and rinsing off the CHG Soap.  9. Pat yourself dry with a CLEAN TOWEL.  10. Wear CLEAN PAJAMAS to bed the night before surgery, wear comfortable clothes the morning of surgery  11. Place CLEAN SHEETS on your bed the night of your first shower and DO NOT SLEEP WITH PETS.  Day of Surgery:  Do not apply any deodorants/lotions.  Please wear clean clothes to the hospital/surgery center.   Remember to brush your teeth WITH YOUR REGULAR TOOTHPASTE.   Please read over the fact sheets that you were given.

## 2018-02-17 ENCOUNTER — Encounter (HOSPITAL_COMMUNITY): Payer: Self-pay

## 2018-02-17 ENCOUNTER — Other Ambulatory Visit: Payer: Self-pay

## 2018-02-17 ENCOUNTER — Other Ambulatory Visit (HOSPITAL_COMMUNITY): Payer: Self-pay

## 2018-02-17 ENCOUNTER — Encounter (HOSPITAL_COMMUNITY)
Admission: RE | Admit: 2018-02-17 | Discharge: 2018-02-17 | Disposition: A | Discharge status: Home / Self Care | Payer: No Typology Code available for payment source | Source: Ambulatory Visit | Attending: Orthopedic Surgery | Admitting: Orthopedic Surgery

## 2018-02-17 DIAGNOSIS — M5137 Other intervertebral disc degeneration, lumbosacral region: Secondary | ICD-10-CM | POA: Diagnosis not present

## 2018-02-17 DIAGNOSIS — Z01818 Encounter for other preprocedural examination: Secondary | ICD-10-CM | POA: Insufficient documentation

## 2018-02-17 LAB — COMPREHENSIVE METABOLIC PANEL
ALK PHOS: 83 U/L (ref 38–126)
ALT: 105 U/L — AB (ref 0–44)
AST: 103 U/L — ABNORMAL HIGH (ref 15–41)
Albumin: 3.2 g/dL — ABNORMAL LOW (ref 3.5–5.0)
Anion gap: 8 (ref 5–15)
BUN: 14 mg/dL (ref 8–23)
CALCIUM: 9 mg/dL (ref 8.9–10.3)
CHLORIDE: 99 mmol/L (ref 98–111)
CO2: 29 mmol/L (ref 22–32)
CREATININE: 1.31 mg/dL — AB (ref 0.61–1.24)
GFR calc Af Amer: >60 mL/min (ref >60)
GFR calc non Af Amer: 56 mL/min — ABNORMAL LOW (ref >60)
GLUCOSE: 127 mg/dL — AB (ref 70–99)
Potassium: 3.5 mmol/L (ref 3.5–5.1)
SODIUM: 136 mmol/L (ref 135–145)
Total Bilirubin: 1.2 mg/dL (ref 0.3–1.2)
Total Protein: 7.7 g/dL (ref 6.5–8.1)

## 2018-02-17 LAB — CBC WITH DIFFERENTIAL/PLATELET
ABS IMMATURE GRANULOCYTES: 0 10*3/uL (ref 0.0–0.1)
BASOS ABS: 0.1 10*3/uL (ref 0.0–0.1)
BASOS PCT: 1 %
Eosinophils Absolute: 0.1 10*3/uL (ref 0.0–0.7)
Eosinophils Relative: 2 %
HCT: 45.1 % (ref 39.0–52.0)
HEMOGLOBIN: 15.3 g/dL (ref 13.0–17.0)
IMMATURE GRANULOCYTES: 0 %
LYMPHS PCT: 39 %
Lymphs Abs: 2.4 10*3/uL (ref 0.7–4.0)
MCH: 35.1 pg — AB (ref 26.0–34.0)
MCHC: 33.9 g/dL (ref 30.0–36.0)
MCV: 103.4 fL — ABNORMAL HIGH (ref 78.0–100.0)
MONO ABS: 0.7 10*3/uL (ref 0.1–1.0)
MONOS PCT: 12 %
NEUTROS PCT: 46 %
Neutro Abs: 2.9 10*3/uL (ref 1.7–7.7)
PLATELETS: 150 10*3/uL (ref 150–400)
RBC: 4.36 MIL/uL (ref 4.22–5.81)
RDW: 12 % (ref 11.5–15.5)
WBC: 6.3 10*3/uL (ref 4.0–10.5)

## 2018-02-17 LAB — URINALYSIS, ROUTINE W REFLEX MICROSCOPIC
BILIRUBIN URINE: NEGATIVE
Glucose, UA: NEGATIVE mg/dL
HGB URINE DIPSTICK: NEGATIVE
Ketones, ur: NEGATIVE mg/dL
Leukocytes, UA: NEGATIVE
NITRITE: NEGATIVE
PROTEIN: NEGATIVE mg/dL
SPECIFIC GRAVITY, URINE: 1.01 (ref 1.005–1.030)
pH: 6 (ref 5.0–8.0)

## 2018-02-17 LAB — PROTIME-INR
INR: 1.03
Prothrombin Time: 13.4 seconds (ref 11.4–15.2)

## 2018-02-17 LAB — TYPE AND SCREEN
ABO/RH(D): O POS
Antibody Screen: NEGATIVE

## 2018-02-17 LAB — SURGICAL PCR SCREEN
MRSA, PCR: NEGATIVE
STAPHYLOCOCCUS AUREUS: NEGATIVE

## 2018-02-17 LAB — APTT: APTT: 29 s (ref 24–36)

## 2018-02-17 MED ORDER — CHLORHEXIDINE GLUCONATE 4 % EX LIQD: 60.0000 mL | Freq: Once | CUTANEOUS | Status: DC

## 2018-02-17 NOTE — Progress Notes (Signed)
   02/17/18 0816  OBSTRUCTIVE SLEEP APNEA  Have you ever been diagnosed with sleep apnea through a sleep study? No  Do you snore loudly (loud enough to be heard through closed doors)?  1  Do you often feel tired, fatigued, or sleepy during the daytime (such as falling asleep during driving or talking to someone)? 0  Has anyone observed you stop breathing during your sleep? 0  Do you have, or are you being treated for high blood pressure? 1  BMI more than 35 kg/m2? 0  Age > 50 (1-yes) 1  Neck circumference greater than:Male 16 inches or larger, Male 17inches or larger? 0 (CLOSE TO 17 INCHES.)  Male Gender (Yes=1) 1  Obstructive Sleep Apnea Score 4  Score 5 or greater  Results sent to PCP

## 2018-02-17 NOTE — Progress Notes (Signed)
PCP is Windell Hummingbird, [place]   LOV 01/2018 Patient denies having diabetes, takes no meds. Denies any cardiac issues, no murmur, cp, sob.  No cardiac testing (per pat.) have been done.

## 2018-02-17 NOTE — Pre-Procedure Instructions (Signed)
Gary Maddox  02/17/2018      [id number] - [place], [place] - [address] [place] [address] Phone: [phone] Fax: [place], [place] - [address] [place] [address] Phone: [phone] Fax: [phone]  Gary Maddox, [place] - [place], [place] - [address] [place] [address] Phone: [phone] Fax: [phone]    Your procedure is scheduled on Wednesday,  February 23, 2018.   Report to [facility] Admitting at 530 AM.             (posted surgery time 8:30a - 2p)   Call this number if you have problems the morning of surgery:  [phone]   Remember:  Do not eat any foods or drink any liquids after midnight, Tuesday.    Take these medicines the morning of surgery with A SIP OF WATER  Carvedilol (coreg) Gabapentin (neurontin) Eye drops-if needed Tramadol (ultram)  7 days prior to surgery STOP taking any diclofenac (voltaren) gel, Aspirin (unless otherwise instructed by your surgeon), Aleve, Naproxen, Ibuprofen, Motrin, Advil, Goody's, BC's, all herbal medications, fish oil, and all vitamins   Do not wear jewelry - NO RINGS or watches.  Do not wear lotions,  cologne, or deodorant.  Men may shave face and neck.  Do not bring valuables to the hospital.  [facility] is not responsible for any belongings or valuables.  Contacts, dentures or bridgework may not be worn into surgery.  Leave your suitcase in the car.  After surgery it may be brought to your room.  For patients admitted to the hospital, discharge time will be determined by your treatment team.     [facility]- Preparing For Surgery  Before surgery, you can play an important role. Because skin is not sterile, your skin needs to be as free of germs as possible. You can reduce  the number of germs on your skin by washing with CHG (chlorahexidine gluconate) Soap before surgery.  CHG is an antiseptic cleaner which kills germs and bonds with the skin to continue killing germs even after washing.    Oral Hygiene is also important to reduce your risk of infection.    Remember - BRUSH YOUR TEETH THE MORNING OF SURGERY WITH YOUR REGULAR TOOTHPASTE  Please do not use if you have an allergy to CHG or antibacterial soaps. If your skin becomes reddened/irritated stop using the CHG.  Do not shave (including legs and underarms) for at least 48 hours prior to first CHG shower. It is OK to shave your face.  Please follow these instructions carefully.   1. Shower the NIGHT BEFORE SURGERY and the MORNING OF SURGERY with CHG.   2. If you chose to wash your hair, wash your hair first as usual with your normal shampoo.  3. After you shampoo, rinse your hair and body thoroughly to remove the shampoo.  4. Use CHG as you would any other liquid soap. You can apply CHG directly to the skin and wash gently with a scrungie or a clean washcloth.   5. Apply the CHG Soap to your body ONLY FROM THE NECK DOWN.  Do not use on open wounds or open sores. Avoid contact with your eyes, ears, mouth and genitals (private parts). Wash Face and genitals (private parts)  with your normal soap.  6. Wash  thoroughly, paying special attention to the area where your surgery will be performed.  7. Thoroughly rinse your body with warm water from the neck down.  8. DO NOT shower/wash with your normal soap after using and rinsing off the CHG Soap.  9. Pat yourself dry with a CLEAN TOWEL.  10. Wear CLEAN PAJAMAS to bed the night before surgery, wear comfortable clothes the morning of surgery  11. Place CLEAN SHEETS on your bed the night of your first shower and DO NOT SLEEP WITH PETS.  Day of Surgery:  Do not apply any deodorants/lotions.  Please wear clean clothes to the hospital/surgery center.    Remember to brush your teeth WITH YOUR REGULAR TOOTHPASTE.   Please read over the fact sheets that you were given.  How to Manage Your Diabetes Before and After Surgery  Why is it important to control my blood sugar before and after surgery? . Improving blood sugar levels before and after surgery helps healing and can limit problems. . A way of improving blood sugar control is eating a healthy diet by: o  Eating less sugar and carbohydrates o  Increasing activity/exercise o  Talking with your doctor about reaching your blood sugar goals . High blood sugars (greater than 180 mg/dL) can raise your risk of infections and slow your recovery, so you will need to focus on controlling your diabetes during the weeks before surgery. . Make sure that the doctor who takes care of your diabetes knows about your planned surgery including the date and location.  How do I manage my blood sugar before surgery? . Check your blood sugar at least 4 times a day, starting 2 days before surgery, to make sure that the level is not too high or low. o Check your blood sugar the morning of your surgery when you wake up and every 2 hours until you get to the Short Stay unit. o  . If your blood sugar is less than 70 mg/dL, you will need to treat for low blood sugar: o Do not take insulin. o Treat a low blood sugar (less than 70 mg/dL) with  cup of clear juice (cranberry or apple), 4 glucose tablets, OR glucose gel. o NO ORANGE JUICE o  Recheck blood sugar in 15 minutes after treatment (to make sure it is greater than 70 mg/dL). If your blood sugar is not greater than 70 mg/dL on recheck, call [phone] o  for further instructions. . Report your blood sugar to the short stay nurse when you get to Short Stay.  . If you are admitted to the hospital after surgery: o Your blood sugar will be checked by the staff and you will probably be given insulin after surgery (instead of oral diabetes medicines) to make  sure you have good blood sugar levels. o The goal for blood sugar control after surgery is 80-180 mg/dL.   WHAT DO I DO ABOUT MY DIABETES MEDICATION?  Marland Kitchen Do not take oral diabetes medicines (pills) the morning of surgery.  . The day of surgery, do not take other diabetes injectables, including Byetta (exenatide), Bydureon (exenatide ER), Victoza (liraglutide), or Trulicity (dulaglutide).  . If your CBG is greater than 220 mg/dL, you may take  of your sliding scale (correction) dose of insulin.

## 2018-02-18 LAB — HEMOGLOBIN A1C
Hgb A1c MFr Bld: 6.4 % — ABNORMAL HIGH (ref 4.8–5.6)
Mean Plasma Glucose: 137 mg/dL

## 2018-02-22 NOTE — Anesthesia Preprocedure Evaluation (Addendum)
Anesthesia Evaluation  Patient identified by MRN, date of birth, ID band Patient awake    Reviewed: Allergy & Precautions, H&P , NPO status , Patient's Chart, lab work & pertinent test results, reviewed documented beta blocker date and time   Airway Mallampati: II  TM Distance: >3 FB Neck ROM: Full    Dental no notable dental hx. (+) Upper Dentures, Lower Dentures, Dental Advisory Given   Pulmonary neg pulmonary ROS, former smoker,    Pulmonary exam normal breath sounds clear to auscultation       Cardiovascular Exercise Tolerance: Good hypertension, Pt. on medications and Pt. on home beta blockers  Rhythm:Regular Rate:Normal     Neuro/Psych negative neurological ROS  negative psych ROS   GI/Hepatic negative GI ROS, (+) Hepatitis -, C  Endo/Other  diabetes  Renal/GU negative Renal ROS  negative genitourinary   Musculoskeletal   Abdominal   Peds  Hematology negative hematology ROS (+)   Anesthesia Other Findings   Reproductive/Obstetrics negative OB ROS                            Anesthesia Physical Anesthesia Plan  ASA: III  Anesthesia Plan: General   Post-op Pain Management:    Induction: Intravenous  PONV Risk Score and Plan: 3 and Ondansetron, Dexamethasone and Midazolam  Airway Management Planned: Oral ETT  Additional Equipment: Arterial line  Intra-op Plan:   Post-operative Plan: Extubation in OR and Possible Post-op intubation/ventilation  Informed Consent: I have reviewed the patients History and Physical, chart, labs and discussed the procedure including the risks, benefits and alternatives for the proposed anesthesia with the patient or authorized representative who has indicated his/her understanding and acceptance.   Dental advisory given  Plan Discussed with: CRNA  Anesthesia Plan Comments:        Anesthesia Quick Evaluation

## 2018-02-23 ENCOUNTER — Inpatient Hospital Stay (HOSPITAL_COMMUNITY)
Admission: RE | Admit: 2018-02-23 | Discharge: 2018-02-24 | DRG: 455 | Disposition: A | Discharge status: Home Health | Payer: No Typology Code available for payment source | Attending: Orthopedic Surgery | Admitting: Orthopedic Surgery

## 2018-02-23 ENCOUNTER — Inpatient Hospital Stay (HOSPITAL_COMMUNITY): Payer: No Typology Code available for payment source | Admitting: Anesthesiology

## 2018-02-23 ENCOUNTER — Inpatient Hospital Stay (HOSPITAL_COMMUNITY): Payer: No Typology Code available for payment source

## 2018-02-23 ENCOUNTER — Encounter (HOSPITAL_COMMUNITY): Payer: Self-pay

## 2018-02-23 ENCOUNTER — Inpatient Hospital Stay (HOSPITAL_COMMUNITY)
Admission: RE | Disposition: A | Discharge status: Home Health | Payer: Self-pay | Source: Home / Self Care | Attending: Orthopedic Surgery

## 2018-02-23 DIAGNOSIS — M4317 Spondylolisthesis, lumbosacral region: Secondary | ICD-10-CM | POA: Diagnosis not present

## 2018-02-23 DIAGNOSIS — Z79899 Other long term (current) drug therapy: Secondary | ICD-10-CM

## 2018-02-23 DIAGNOSIS — M5416 Radiculopathy, lumbar region: Secondary | ICD-10-CM | POA: Diagnosis not present

## 2018-02-23 DIAGNOSIS — K219 Gastro-esophageal reflux disease without esophagitis: Secondary | ICD-10-CM | POA: Diagnosis present

## 2018-02-23 DIAGNOSIS — Z8249 Family history of ischemic heart disease and other diseases of the circulatory system: Secondary | ICD-10-CM | POA: Diagnosis not present

## 2018-02-23 DIAGNOSIS — E119 Type 2 diabetes mellitus without complications: Secondary | ICD-10-CM | POA: Diagnosis present

## 2018-02-23 DIAGNOSIS — M5410 Radiculopathy, site unspecified: Secondary | ICD-10-CM | POA: Diagnosis present

## 2018-02-23 DIAGNOSIS — B182 Chronic viral hepatitis C: Secondary | ICD-10-CM | POA: Diagnosis present

## 2018-02-23 DIAGNOSIS — Z87891 Personal history of nicotine dependence: Secondary | ICD-10-CM | POA: Diagnosis not present

## 2018-02-23 DIAGNOSIS — M4807 Spinal stenosis, lumbosacral region: Secondary | ICD-10-CM | POA: Diagnosis not present

## 2018-02-23 DIAGNOSIS — I10 Essential (primary) hypertension: Secondary | ICD-10-CM | POA: Diagnosis present

## 2018-02-23 DIAGNOSIS — M79604 Pain in right leg: Secondary | ICD-10-CM | POA: Diagnosis present

## 2018-02-23 DIAGNOSIS — Z419 Encounter for procedure for purposes other than remedying health state, unspecified: Secondary | ICD-10-CM

## 2018-02-23 HISTORY — PX: ABDOMINAL EXPOSURE: SHX5708

## 2018-02-23 HISTORY — PX: ANTERIOR LUMBAR FUSION: SHX1170

## 2018-02-23 LAB — GLUCOSE, CAPILLARY: Glucose-Capillary: 133 mg/dL — ABNORMAL HIGH (ref 70–99)

## 2018-02-23 SURGERY — ANTERIOR LUMBAR FUSION 2 LEVELS
Anesthesia: General | Site: Back

## 2018-02-23 MED ORDER — BUPIVACAINE LIPOSOME 1.3 % IJ SUSP
20.0000 mL | INTRAMUSCULAR | Status: AC
  Administered 2018-02-23: 10 mL
  Filled 2018-02-23: qty 20

## 2018-02-23 MED ORDER — POVIDONE-IODINE 7.5 % EX SOLN
Freq: Once | CUTANEOUS | Status: DC
  Filled 2018-02-23: qty 118

## 2018-02-23 MED ORDER — HYDROCHLOROTHIAZIDE 25 MG PO TABS: 25.0000 mg | ORAL_TABLET | Freq: Every day | ORAL | Status: DC

## 2018-02-23 MED ORDER — ROCURONIUM BROMIDE 50 MG/5ML IV SOSY
PREFILLED_SYRINGE | INTRAVENOUS | Status: DC | PRN
  Administered 2018-02-23: 20 mg via INTRAVENOUS
  Administered 2018-02-23 (×2): 10 mg via INTRAVENOUS
  Administered 2018-02-23: 50 mg via INTRAVENOUS

## 2018-02-23 MED ORDER — ALUM & MAG HYDROXIDE-SIMETH 200-200-20 MG/5ML PO SUSP: 30.0000 mL | Freq: Four times a day (QID) | ORAL | Status: DC | PRN

## 2018-02-23 MED ORDER — CARVEDILOL 12.5 MG PO TABS
12.5000 mg | ORAL_TABLET | Freq: Two times a day (BID) | ORAL | Status: DC
  Filled 2018-02-23: qty 1

## 2018-02-23 MED ORDER — HYDROMORPHONE HCL 1 MG/ML IJ SOLN
INTRAMUSCULAR | Status: AC
  Filled 2018-02-23: qty 1

## 2018-02-23 MED ORDER — DEXAMETHASONE SODIUM PHOSPHATE 10 MG/ML IJ SOLN
INTRAMUSCULAR | Status: DC | PRN
  Administered 2018-02-23: 10 mg via INTRAVENOUS

## 2018-02-23 MED ORDER — POTASSIUM CHLORIDE IN NACL 20-0.9 MEQ/L-% IV SOLN: INTRAVENOUS | Status: DC

## 2018-02-23 MED ORDER — MORPHINE SULFATE (PF) 2 MG/ML IV SOLN: 1.0000 mg | INTRAVENOUS | Status: DC | PRN

## 2018-02-23 MED ORDER — DOCUSATE SODIUM 100 MG PO CAPS
100.0000 mg | ORAL_CAPSULE | Freq: Two times a day (BID) | ORAL | Status: DC
  Administered 2018-02-23: 100 mg via ORAL
  Filled 2018-02-23: qty 1

## 2018-02-23 MED ORDER — ONDANSETRON HCL 4 MG PO TABS: 4.0000 mg | ORAL_TABLET | Freq: Four times a day (QID) | ORAL | Status: DC | PRN

## 2018-02-23 MED ORDER — GLYCOPYRROLATE PF 0.2 MG/ML IJ SOSY
PREFILLED_SYRINGE | INTRAMUSCULAR | Status: AC
  Filled 2018-02-23: qty 1

## 2018-02-23 MED ORDER — ROCURONIUM BROMIDE 50 MG/5ML IV SOSY
PREFILLED_SYRINGE | INTRAVENOUS | Status: AC
  Filled 2018-02-23: qty 5

## 2018-02-23 MED ORDER — SODIUM CHLORIDE 0.9% FLUSH: 3.0000 mL | Freq: Two times a day (BID) | INTRAVENOUS | Status: DC

## 2018-02-23 MED ORDER — ACETAMINOPHEN 325 MG PO TABS: 650.0000 mg | ORAL_TABLET | ORAL | Status: DC | PRN

## 2018-02-23 MED ORDER — LACTATED RINGERS IV SOLN
INTRAVENOUS | Status: DC | PRN
  Administered 2018-02-23 (×2): via INTRAVENOUS

## 2018-02-23 MED ORDER — OLMESARTAN-AMLODIPINE-HCTZ 40-10-25 MG PO TABS: 1.0000 | ORAL_TABLET | Freq: Every day | ORAL | Status: DC

## 2018-02-23 MED ORDER — NAPHAZOLINE-GLYCERIN 0.012-0.2 % OP SOLN: 1.0000 [drp] | Freq: Four times a day (QID) | OPHTHALMIC | Status: DC | PRN

## 2018-02-23 MED ORDER — SUGAMMADEX SODIUM 200 MG/2ML IV SOLN
INTRAVENOUS | Status: DC | PRN
  Administered 2018-02-23: 150 mg via INTRAVENOUS

## 2018-02-23 MED ORDER — MIDAZOLAM HCL 2 MG/2ML IJ SOLN
INTRAMUSCULAR | Status: AC
  Filled 2018-02-23: qty 2

## 2018-02-23 MED ORDER — BUPIVACAINE-EPINEPHRINE (PF) 0.25% -1:200000 IJ SOLN
INTRAMUSCULAR | Status: AC
  Filled 2018-02-23: qty 30

## 2018-02-23 MED ORDER — BUPIVACAINE-EPINEPHRINE 0.25% -1:200000 IJ SOLN
INTRAMUSCULAR | Status: DC | PRN
  Administered 2018-02-23: 10 mL

## 2018-02-23 MED ORDER — LIDOCAINE 2% (20 MG/ML) 5 ML SYRINGE
INTRAMUSCULAR | Status: AC
  Filled 2018-02-23: qty 5

## 2018-02-23 MED ORDER — ACETAMINOPHEN 650 MG RE SUPP: 650.0000 mg | RECTAL | Status: DC | PRN

## 2018-02-23 MED ORDER — PROPOFOL 1000 MG/100ML IV EMUL
INTRAVENOUS | Status: AC
  Filled 2018-02-23: qty 100

## 2018-02-23 MED ORDER — ONDANSETRON HCL 4 MG/2ML IJ SOLN
INTRAMUSCULAR | Status: DC | PRN
  Administered 2018-02-23: 4 mg via INTRAVENOUS

## 2018-02-23 MED ORDER — FLEET ENEMA 7-19 GM/118ML RE ENEM: 1.0000 | ENEMA | Freq: Once | RECTAL | Status: DC | PRN

## 2018-02-23 MED ORDER — HYDROMORPHONE HCL 1 MG/ML IJ SOLN
0.2500 mg | INTRAMUSCULAR | Status: DC | PRN
  Administered 2018-02-23: 0.5 mg via INTRAVENOUS

## 2018-02-23 MED ORDER — BISACODYL 5 MG PO TBEC: 5.0000 mg | DELAYED_RELEASE_TABLET | Freq: Every day | ORAL | Status: DC | PRN

## 2018-02-23 MED ORDER — GABAPENTIN 300 MG PO CAPS
300.0000 mg | ORAL_CAPSULE | Freq: Two times a day (BID) | ORAL | Status: DC
  Administered 2018-02-23: 300 mg via ORAL
  Filled 2018-02-23: qty 1

## 2018-02-23 MED ORDER — METHYLENE BLUE 0.5 % INJ SOLN
INTRAVENOUS | Status: AC
  Filled 2018-02-23: qty 10

## 2018-02-23 MED ORDER — LACTATED RINGERS IV SOLN
INTRAVENOUS | Status: DC | PRN
  Administered 2018-02-23: 08:00:00 via INTRAVENOUS

## 2018-02-23 MED ORDER — IRBESARTAN 300 MG PO TABS
300.0000 mg | ORAL_TABLET | Freq: Every day | ORAL | Status: DC
  Filled 2018-02-23: qty 1

## 2018-02-23 MED ORDER — PROPOFOL 10 MG/ML IV BOLUS
INTRAVENOUS | Status: AC
  Filled 2018-02-23: qty 20

## 2018-02-23 MED ORDER — SALINE SPRAY 0.65 % NA SOLN
1.0000 | NASAL | Status: DC | PRN
  Filled 2018-02-23: qty 44

## 2018-02-23 MED ORDER — ONDANSETRON HCL 4 MG/2ML IJ SOLN
INTRAMUSCULAR | Status: AC
  Filled 2018-02-23: qty 2

## 2018-02-23 MED ORDER — 0.9 % SODIUM CHLORIDE (POUR BTL) OPTIME
TOPICAL | Status: DC | PRN
  Administered 2018-02-23: 1000 mL

## 2018-02-23 MED ORDER — CEFAZOLIN SODIUM-DEXTROSE 2-4 GM/100ML-% IV SOLN
2.0000 g | Freq: Three times a day (TID) | INTRAVENOUS | Status: AC
  Administered 2018-02-23 – 2018-02-24 (×2): 2 g via INTRAVENOUS
  Filled 2018-02-23 (×2): qty 100

## 2018-02-23 MED ORDER — PHENOL 1.4 % MT LIQD: 1.0000 | OROMUCOSAL | Status: DC | PRN

## 2018-02-23 MED ORDER — FENTANYL CITRATE (PF) 100 MCG/2ML IJ SOLN
INTRAMUSCULAR | Status: DC | PRN
  Administered 2018-02-23: 50 ug via INTRAVENOUS
  Administered 2018-02-23: 100 ug via INTRAVENOUS
  Administered 2018-02-23: 150 ug via INTRAVENOUS
  Administered 2018-02-23 (×2): 50 ug via INTRAVENOUS

## 2018-02-23 MED ORDER — NAPHAZOLINE-GLYCERIN 0.012-0.2 % OP SOLN
1.0000 [drp] | Freq: Four times a day (QID) | OPHTHALMIC | Status: DC | PRN
  Filled 2018-02-23: qty 15

## 2018-02-23 MED ORDER — PROPOFOL 500 MG/50ML IV EMUL
INTRAVENOUS | Status: DC | PRN
  Administered 2018-02-23: 50 ug/kg/min via INTRAVENOUS

## 2018-02-23 MED ORDER — CEFAZOLIN SODIUM-DEXTROSE 2-4 GM/100ML-% IV SOLN
2.0000 g | INTRAVENOUS | Status: AC
  Administered 2018-02-23: 2 g via INTRAVENOUS
  Filled 2018-02-23: qty 100

## 2018-02-23 MED ORDER — THROMBIN (RECOMBINANT) 20000 UNITS EX SOLR
CUTANEOUS | Status: AC
  Filled 2018-02-23: qty 20000

## 2018-02-23 MED ORDER — GLYCOPYRROLATE PF 0.2 MG/ML IJ SOSY
PREFILLED_SYRINGE | INTRAMUSCULAR | Status: DC | PRN
  Administered 2018-02-23: .2 mg via INTRAVENOUS

## 2018-02-23 MED ORDER — SODIUM CHLORIDE 0.9 % IV SOLN
INTRAVENOUS | Status: DC | PRN
  Administered 2018-02-23: 20 ug/min via INTRAVENOUS

## 2018-02-23 MED ORDER — ZOLPIDEM TARTRATE 5 MG PO TABS: 5.0000 mg | ORAL_TABLET | Freq: Every evening | ORAL | Status: DC | PRN

## 2018-02-23 MED ORDER — OXYCODONE-ACETAMINOPHEN 5-325 MG PO TABS
1.0000 | ORAL_TABLET | ORAL | Status: DC | PRN
  Administered 2018-02-23 – 2018-02-24 (×4): 2 via ORAL
  Filled 2018-02-23 (×4): qty 2

## 2018-02-23 MED ORDER — THROMBIN 20000 UNITS EX KIT
PACK | CUTANEOUS | Status: DC | PRN
  Administered 2018-02-23: 20 mL via TOPICAL

## 2018-02-23 MED ORDER — FENTANYL CITRATE (PF) 250 MCG/5ML IJ SOLN
INTRAMUSCULAR | Status: AC
  Filled 2018-02-23: qty 5

## 2018-02-23 MED ORDER — DEXAMETHASONE SODIUM PHOSPHATE 10 MG/ML IJ SOLN
INTRAMUSCULAR | Status: AC
  Filled 2018-02-23: qty 1

## 2018-02-23 MED ORDER — AMLODIPINE BESYLATE 5 MG PO TABS: 10.0000 mg | ORAL_TABLET | Freq: Every day | ORAL | Status: DC

## 2018-02-23 MED ORDER — LIDOCAINE 2% (20 MG/ML) 5 ML SYRINGE
INTRAMUSCULAR | Status: DC | PRN
  Administered 2018-02-23: 80 mg via INTRAVENOUS

## 2018-02-23 MED ORDER — PROPOFOL 10 MG/ML IV BOLUS
INTRAVENOUS | Status: DC | PRN
  Administered 2018-02-23: 160 mg via INTRAVENOUS

## 2018-02-23 MED ORDER — DIAZEPAM 5 MG PO TABS
5.0000 mg | ORAL_TABLET | Freq: Four times a day (QID) | ORAL | Status: DC | PRN
  Administered 2018-02-23 – 2018-02-24 (×2): 5 mg via ORAL
  Filled 2018-02-23 (×2): qty 1

## 2018-02-23 MED ORDER — MENTHOL 3 MG MT LOZG: 1.0000 | LOZENGE | OROMUCOSAL | Status: DC | PRN

## 2018-02-23 MED ORDER — ONDANSETRON HCL 4 MG/2ML IJ SOLN: 4.0000 mg | Freq: Four times a day (QID) | INTRAMUSCULAR | Status: DC | PRN

## 2018-02-23 MED ORDER — MIDAZOLAM HCL 2 MG/2ML IJ SOLN
INTRAMUSCULAR | Status: DC | PRN
  Administered 2018-02-23: 2 mg via INTRAVENOUS

## 2018-02-23 MED ORDER — PANTOPRAZOLE SODIUM 40 MG PO TBEC
40.0000 mg | DELAYED_RELEASE_TABLET | Freq: Two times a day (BID) | ORAL | Status: DC
  Administered 2018-02-23: 40 mg via ORAL
  Filled 2018-02-23: qty 1

## 2018-02-23 MED ORDER — METHYLENE BLUE 0.5 % INJ SOLN: INTRAVENOUS | Status: DC | PRN

## 2018-02-23 MED ORDER — SENNOSIDES-DOCUSATE SODIUM 8.6-50 MG PO TABS: 1.0000 | ORAL_TABLET | Freq: Every evening | ORAL | Status: DC | PRN

## 2018-02-23 MED ORDER — SODIUM CHLORIDE 0.9% FLUSH: 3.0000 mL | INTRAVENOUS | Status: DC | PRN

## 2018-02-23 SURGICAL SUPPLY — 142 items
ADH SKN CLS APL DERMABOND .7 (GAUZE/BANDAGES/DRESSINGS) ×3
APL SKNCLS STERI-STRIP NONHPOA (GAUZE/BANDAGES/DRESSINGS) ×6
APPLIER CLIP 11 MED OPEN (CLIP) ×8
APR CLP MED 11 20 MLT OPN (CLIP) ×6
BENZOIN TINCTURE PRP APPL 2/3 (GAUZE/BANDAGES/DRESSINGS) ×5 IMPLANT
BLADE CLIPPER SURG (BLADE) IMPLANT
BLADE SURG 10 STRL SS (BLADE) ×4 IMPLANT
BONE VIVIGEN FORMABLE 10CC (Bone Implant) ×4 IMPLANT
BUR PRESCISION 1.7 ELITE (BURR) ×4 IMPLANT
BUR ROUND FLUTED 5 RND (BURR) ×4 IMPLANT
BUR ROUND PRECISION 4.0 (BURR) IMPLANT
BUR SABER RD CUTTING 3.0 (BURR) IMPLANT
CAGE COUGAR MED 10D 18 (Cage) ×1 IMPLANT
CARTRIDGE OIL MAESTRO DRILL (MISCELLANEOUS) ×3 IMPLANT
CLIP APPLIE 11 MED OPEN (CLIP) ×6 IMPLANT
CLIP LIGATING EXTRA MED SLVR (CLIP) ×4 IMPLANT
CLIP LIGATING EXTRA SM BLUE (MISCELLANEOUS) ×4 IMPLANT
CLSR STERI-STRIP ANTIMIC 1/2X4 (GAUZE/BANDAGES/DRESSINGS) ×2 IMPLANT
CONT SPEC 4OZ CLIKSEAL STRL BL (MISCELLANEOUS) ×4 IMPLANT
CORDS BIPOLAR (ELECTRODE) ×4 IMPLANT
COVER MAYO STAND STRL (DRAPES) ×8 IMPLANT
COVER SURGICAL LIGHT HANDLE (MISCELLANEOUS) ×8 IMPLANT
COVER WAND RF STERILE (DRAPES) ×12 IMPLANT
DERMABOND ADVANCED (GAUZE/BANDAGES/DRESSINGS) ×1
DERMABOND ADVANCED .7 DNX12 (GAUZE/BANDAGES/DRESSINGS) ×3 IMPLANT
DIFFUSER DRILL AIR PNEUMATIC (MISCELLANEOUS) ×4 IMPLANT
DRAIN CHANNEL 15F RND FF W/TCR (WOUND CARE) IMPLANT
DRAPE C-ARM 42X72 X-RAY (DRAPES) ×12 IMPLANT
DRAPE C-ARMOR (DRAPES) IMPLANT
DRAPE POUCH INSTRU U-SHP 10X18 (DRAPES) ×8 IMPLANT
DRAPE SURG 17X23 STRL (DRAPES) ×28 IMPLANT
DRSG MEPILEX BORDER 4X12 (GAUZE/BANDAGES/DRESSINGS) ×4 IMPLANT
DURAPREP 26ML APPLICATOR (WOUND CARE) ×8 IMPLANT
ELECT BLADE 4.0 EZ CLEAN MEGAD (MISCELLANEOUS) ×12
ELECT CAUTERY BLADE 6.4 (BLADE) ×8 IMPLANT
ELECT REM PT RETURN 9FT ADLT (ELECTROSURGICAL) ×8
ELECTRODE BLDE 4.0 EZ CLN MEGD (MISCELLANEOUS) ×9 IMPLANT
ELECTRODE REM PT RTRN 9FT ADLT (ELECTROSURGICAL) ×6 IMPLANT
EVACUATOR SILICONE 100CC (DRAIN) IMPLANT
FEE INTRAOP MONITOR IMPULS NCS (MISCELLANEOUS) IMPLANT
FILTER STRAW FLUID ASPIR (MISCELLANEOUS) ×4 IMPLANT
GAUZE 4X4 16PLY RFD (DISPOSABLE) ×4 IMPLANT
GAUZE SPONGE 4X4 12PLY STRL (GAUZE/BANDAGES/DRESSINGS) ×4 IMPLANT
GAUZE SPONGE 4X4 12PLY STRL LF (GAUZE/BANDAGES/DRESSINGS) ×1 IMPLANT
GLOVE BIO SURGEON STRL SZ7 (GLOVE) ×8 IMPLANT
GLOVE BIO SURGEON STRL SZ8 (GLOVE) ×8 IMPLANT
GLOVE BIOGEL PI IND STRL 7.0 (GLOVE) ×6 IMPLANT
GLOVE BIOGEL PI IND STRL 8 (GLOVE) ×9 IMPLANT
GLOVE BIOGEL PI INDICATOR 7.0 (GLOVE) ×2
GLOVE BIOGEL PI INDICATOR 8 (GLOVE) ×3
GLOVE SS BIOGEL STRL SZ 7.5 (GLOVE) ×3 IMPLANT
GLOVE SUPERSENSE BIOGEL SZ 7.5 (GLOVE) ×1
GOWN STRL REUS W/ TWL LRG LVL3 (GOWN DISPOSABLE) ×15 IMPLANT
GOWN STRL REUS W/ TWL XL LVL3 (GOWN DISPOSABLE) ×6 IMPLANT
GOWN STRL REUS W/TWL LRG LVL3 (GOWN DISPOSABLE) ×20
GOWN STRL REUS W/TWL XL LVL3 (GOWN DISPOSABLE) ×8
GRAFT BNE MATRIX VG FRMBL L 10 (Bone Implant) IMPLANT
GUIDEWIRE BLUNT VIPER II 1.45 (WIRE) ×1 IMPLANT
GUIDEWIRE SHARP VIPER II (WIRE) ×4 IMPLANT
HEMOSTAT SURGICEL 2X14 (HEMOSTASIS) IMPLANT
INSERT FOGARTY 61MM (MISCELLANEOUS) IMPLANT
INSERT FOGARTY SM (MISCELLANEOUS) IMPLANT
INTRAOP MONITOR FEE IMPULS NCS (MISCELLANEOUS) ×3
INTRAOP MONITOR FEE IMPULSE (MISCELLANEOUS) ×1
IV CATH 14GX2 1/4 (CATHETERS) ×4 IMPLANT
KIT BASIN OR (CUSTOM PROCEDURE TRAY) ×8 IMPLANT
KIT POSITION SURG JACKSON T1 (MISCELLANEOUS) ×4 IMPLANT
KIT TURNOVER KIT B (KITS) ×8 IMPLANT
LOOP VESSEL MAXI BLUE (MISCELLANEOUS) IMPLANT
LOOP VESSEL MINI RED (MISCELLANEOUS) IMPLANT
MARKER SKIN DUAL TIP RULER LAB (MISCELLANEOUS) ×8 IMPLANT
NDL ASP BONE MRW 11GX15 J (NEEDLE) IMPLANT
NDL HYPO 25GX1X1/2 BEV (NEEDLE) ×6 IMPLANT
NDL SAFETY ECLIPSE 18X1.5 (NEEDLE) ×3 IMPLANT
NDL SPNL 10G INTRO CONFI (NEEDLE) IMPLANT
NDL SPNL 18GX3.5 QUINCKE PK (NEEDLE) ×9 IMPLANT
NEEDLE 22X1 1/2 (OR ONLY) (NEEDLE) ×8 IMPLANT
NEEDLE ASP BONE MRW 11GX15 J (NEEDLE) ×12 IMPLANT
NEEDLE HYPO 18GX1.5 SHARP (NEEDLE) ×4
NEEDLE HYPO 25GX1X1/2 BEV (NEEDLE) ×8 IMPLANT
NEEDLE SPNL 10G INTRO CONFI (NEEDLE) ×8 IMPLANT
NEEDLE SPNL 18GX3.5 QUINCKE PK (NEEDLE) ×12 IMPLANT
NS IRRIG 1000ML POUR BTL (IV SOLUTION) ×8 IMPLANT
OIL CARTRIDGE MAESTRO DRILL (MISCELLANEOUS) ×4
PACK LAMINECTOMY ORTHO (CUSTOM PROCEDURE TRAY) ×8 IMPLANT
PACK UNIVERSAL I (CUSTOM PROCEDURE TRAY) ×8 IMPLANT
PAD ARMBOARD 7.5X6 YLW CONV (MISCELLANEOUS) ×24 IMPLANT
PATTIES SURGICAL .5 X1 (DISPOSABLE) ×8 IMPLANT
PATTIES SURGICAL .5X1.5 (GAUZE/BANDAGES/DRESSINGS) ×4 IMPLANT
PLATE AEGIS 25MM (Plate) ×1 IMPLANT
PROBE PEDCLE PROBE MAGSTM DISP (MISCELLANEOUS) ×1 IMPLANT
ROD VIPER II LORDOSED 5.5X40 (Rod) ×1 IMPLANT
ROD VIPER2 5.5X45 PRE LARDOSED (Rod) ×1 IMPLANT
SCREW AEGIS 24MM (Screw) ×2 IMPLANT
SCREW AEGIS 28MM (Screw) ×2 IMPLANT
SCREW POLY VIPER2 7X40MM (Screw) ×2 IMPLANT
SCREW SET SINGLE INNER MIS (Screw) ×4 IMPLANT
SCREW XTAB POLY VIPER  7X45 (Screw) ×2 IMPLANT
SCREW XTAB POLY VIPER 7X45 (Screw) IMPLANT
SPONGE INTESTINAL PEANUT (DISPOSABLE) ×16 IMPLANT
SPONGE LAP 18X18 X RAY DECT (DISPOSABLE) IMPLANT
SPONGE LAP 4X18 RFD (DISPOSABLE) IMPLANT
SPONGE SURGIFOAM ABS GEL 100 (HEMOSTASIS) ×12 IMPLANT
STAPLER VISISTAT 35W (STAPLE) IMPLANT
STRIP CLOSURE SKIN 1/2X4 (GAUZE/BANDAGES/DRESSINGS) ×8 IMPLANT
SURGIFLO W/THROMBIN 8M KIT (HEMOSTASIS) IMPLANT
SUT MNCRL AB 4-0 PS2 18 (SUTURE) ×8 IMPLANT
SUT PDS AB 1 CTX 36 (SUTURE) ×8 IMPLANT
SUT PROLENE 4 0 RB 1 (SUTURE)
SUT PROLENE 4-0 RB1 .5 CRCL 36 (SUTURE) IMPLANT
SUT PROLENE 5 0 C 1 24 (SUTURE) IMPLANT
SUT PROLENE 5 0 CC1 (SUTURE) IMPLANT
SUT PROLENE 6 0 C 1 30 (SUTURE) ×4 IMPLANT
SUT PROLENE 6 0 CC (SUTURE) IMPLANT
SUT SILK 0 TIES 10X30 (SUTURE) ×4 IMPLANT
SUT SILK 2 0 TIES 10X30 (SUTURE) ×8 IMPLANT
SUT SILK 2 0SH CR/8 30 (SUTURE) IMPLANT
SUT SILK 3 0 TIES 10X30 (SUTURE) ×8 IMPLANT
SUT SILK 3 0SH CR/8 30 (SUTURE) IMPLANT
SUT VIC AB 0 CT1 18XCR BRD 8 (SUTURE) ×3 IMPLANT
SUT VIC AB 0 CT1 8-18 (SUTURE) ×4
SUT VIC AB 1 CT1 18XCR BRD 8 (SUTURE) ×3 IMPLANT
SUT VIC AB 1 CT1 27 (SUTURE) ×8
SUT VIC AB 1 CT1 27XBRD ANBCTR (SUTURE) ×6 IMPLANT
SUT VIC AB 1 CT1 8-18 (SUTURE) ×4
SUT VIC AB 1 CTX 36 (SUTURE) ×8
SUT VIC AB 1 CTX36XBRD ANBCTR (SUTURE) ×6 IMPLANT
SUT VIC AB 2-0 CT2 18 VCP726D (SUTURE) ×8 IMPLANT
SYR 20CC LL (SYRINGE) ×8 IMPLANT
SYR BULB IRRIGATION 50ML (SYRINGE) ×8 IMPLANT
SYR CONTROL 10ML LL (SYRINGE) ×8 IMPLANT
SYR TB 1ML LUER SLIP (SYRINGE) ×4 IMPLANT
TAP CANN VIPER2 DL 5.0 (TAP) ×1 IMPLANT
TAP CANN VIPER2 DL 6.0 (TAP) ×1 IMPLANT
TAPE CLOTH SURG 4X10 WHT LF (GAUZE/BANDAGES/DRESSINGS) ×2 IMPLANT
TOWEL GREEN STERILE (TOWEL DISPOSABLE) ×4 IMPLANT
TOWEL OR 17X24 6PK STRL BLUE (TOWEL DISPOSABLE) ×4 IMPLANT
TOWEL OR 17X26 10 PK STRL BLUE (TOWEL DISPOSABLE) ×4 IMPLANT
TRAY FOLEY CATH SILVER 16FR (SET/KITS/TRAYS/PACK) ×4 IMPLANT
TRAY FOLEY MTR SLVR 16FR STAT (SET/KITS/TRAYS/PACK) ×4 IMPLANT
WATER STERILE IRR 1000ML POUR (IV SOLUTION) ×8 IMPLANT
YANKAUER SUCT BULB TIP NO VENT (SUCTIONS) ×8 IMPLANT

## 2018-02-23 NOTE — Op Note (Signed)
DATE OF PROCEDURE:  02/23/2018  PATIENT NAME: Gary Maddox DOB: 1950/12/29 MR #: [id number]                              OPERATIVE REPORT   PREOPERATIVE DIAGNOSES: 1. Right-sided lumbar radiculopathy. 2. L5/S1 spondylolisthesis 3. Severe right L5-S1 neuroforaminal stenosis  POSTOPERATIVE DIAGNOSES: 1. Right-sided lumbar radiculopathy. 2. L5/S1 spondylolisthesis 3. Severe right L5-S1 neuroforaminal stenosis  PROCEDURES: 1. Anterior lumbar interbody fusion, L5-S1 (expsure peformed by Dr. Sherren Mocha Early) 2. Insertion of interbody device x1 (Cougar intervertebral spacer, 9mm, medium, 10 degree lordotic). 3. Placement of anterior instrumentation, L5-S1 (Aegis anterior lumbar plate) 4. Intraoperative use of fluoroscopy. 5. Use of morselized allograft - Vivigen 6. 1st assistant to Dr. Sherren Mocha Early for retroperitoneal exposure 7. Placement of posterior instrumentation L5, S1 (7 mm Viper screws) 8. Posterior spinal fusion, L5-S1  SURGEON:  Phylliss Bob, MD  ASSISTANT:  Pricilla Holm, PA-C  ANESTHESIA:  General endotracheal anesthesia.  COMPLICATIONS:  None.  DISPOSITION:  Stable.  ESTIMATED BLOOD LOSS:  minimal  INDICATIONS FOR SURGERY:  Briefly, Gary Maddox is a very pleasant 67 year old male, who has been having progressive debilitating pain in the right leg.  The patient's MRI and X-rays did reveal severe right L5-S1 neuroforaminal stenosis, with a grade 1 L5-S1 spondylolisthesis.  The patient's pain began at the time of a work injury in February of 2018. The patient did fail appropriate nonoperative measures, but did continue to have significant pain in the left leg. Given his ongoing pain and dysfunction, we did discuss proceeding with the procedure noted above.  The patient was fully aware of the risks and limitations associated with surgery, and did wish to proceed.    OPERATIVE DETAILS:  On 02/23/2018, the patient was brought to surgery and general endotracheal  anesthesia was administered.  The patient was placed supine on the hospital bed.  The patient's abdomen was prepped and draped in the usual sterile fashion.  An anterior retroperitoneal approach was then performed by Dr. Curt Jews.  I did function as his first assistant during the approach.  Once the anterior lumbar spine was noted, we did focus our attention on the L5-S1 intervertebral space.  I then performed a thorough and complete L5-S1 intervertebral diskectomy to the level of the posterior longitudinal ligament. I was very pleased with the diskectomy and decompression that I was able to accomplish.   Of note, the upper endplate was very concave, and a did partially remove the anterior aspect of the L5 endplate, in order to lessen the significant concavity, in order to prepare the intervertebral space most appropriately for the best fit of the intervertebral implant. The endplates were then appropriately prepared and the appropriate sized anterior intervertebral spacer was packed with Vivigen and tamped into position. I was very pleased with the press-fit of the implant.  I then proceeded with placement of anterior instrumentation at the L5-S1 intervertebral space.   A 25 mm plate was secured over the anterior spine across the L5-S1 intervertebral space.  Vertebral screws were placed through the plate, into the L5 and S1 vertebral bodies.  2 screws were placed in L5, and to screws were placed in S1, for total of 4 screws.  The screws were then locked to the plate using the cam locking mechanism. I was very pleased with the press-fit of the anterior hardware.  I did liberally use AP and lateral fluoroscopy to ensure that the implant  and anterior fixation was in the appropriate position, and was very pleased with the radiographs. The wound was copiously irrigated.  The fascia was  closed using #1 PDS.  The subcutaneous layer was closed using 0 Vicryl followed by 2-0 Vicryl, and the skin was closed  using 4-0 Monocryl. Benzoin and Steri-Strips were applied followed by sterile dressing.   All instrument counts were correct at the termination of the procedure. I did use neurologic monitoring throughout the surgery, and there was no abnormal EMG activity noted throughout the surgery.  At this point, the patient was rolled prone onto a Jackson spinal frame.  The back was prepped and draped in the usual sterile fashion.  A timeout procedure was again performed.  Paramedian incisions were made on the right and left sides just lateral to the L5 and S1 pedicles.  On the left side, the left L5-S1 facet joint and posterior lateral gutter was exposed and decorticated using a high-speed bur.  Abundant allograft in the form of Vivigen was packed in the region of the left L5-S1 facet joint and posterior lateral gutter.  At this point, liberally using AP and lateral fluoroscopy, I did cannulate the L5 and S1 pedicles, and guidewires were placed across the pedicles.  I used a 6 mm tap over the guidewires.  I did use triggered EMG to test each of the taps, and there was no tap that tested below 12 mA.  At this point, 7 x 45 mm screws were placed into the L5 pedicles, and 7 x 40 mm screws were placed into the S1 pedicles.  Rods were secured into the tulip heads of the screws.  Caps were then placed in a final locking procedure was performed bilaterally.  I was very pleased with the final AP and lateral fluoroscopic images.  The wound was then copiously irrigated.  The fascia was then closed bilaterally using #1 Vicryl, and the skin was closed using 2-0 Vicryl followed by 4-0 Monocryl. All instrument counts were correct at the termination of the procedure. I did use neurologic monitoring throughout the surgery, and there was no abnormal EMG activity noted throughout the surgery.  Of note, Pricilla Holm was my assistant throughout the anterior and posterior surgeries, and did aid in retraction, suctioning, and closure  for both the anterior and posterior portions of the procedure.   Phylliss Bob, MD

## 2018-02-23 NOTE — H&P (Signed)
PREOPERATIVE H&P  Chief Complaint: Right leg pain  HPI: Gary Maddox is a 67 y.o. male who presents with ongoing pain in the right leg since 2/18.   MRI reveals severe right NF stenosis at L5/S1 with a grade 1 spondylolisthesis at this level.  Patient has failed multiple forms of conservative care and continues to have pain (see office notes for additional details regarding the patient's full course of treatment)  Past Medical History:  Diagnosis Date  . ABUSE, ALCOHOL, IN REMISSION 12/24/2006   Qualifier: Diagnosis of  By: Leward Quan MD, Pamala Hurry    . Diabetes mellitus without complication ([place])   . Elevated LDL cholesterol level 10/05/2016  . Essential hypertension 12/21/2006   Qualifier: Diagnosis of  By: Leward Quan MD, Pamala Hurry    . GERD 12/21/2006   Qualifier: Diagnosis of  By: Leward Quan MD, Pamala Hurry    . HEPATITIS C, CHRONIC VIRAL, W/O HEPATIC COMA 12/24/2006   Qualifier: Diagnosis of  By: Leward Quan MD, Pamala Hurry     Past Surgical History:  Procedure Laterality Date  . FOOT SURGERY    . KNEE SURGERY     Social History   Socioeconomic History  . Marital status: Married    Spouse name: Not on file  . Number of children: 8  . Years of education: Not on file  . Highest education level: Not on file  Occupational History  . Occupation: sports maintanence  Social Needs  . Financial resource strain: Not on file  . Food insecurity:    Worry: Not on file    Inability: Not on file  . Transportation needs:    Medical: Not on file    Non-medical: Not on file  Tobacco Use  . Smoking status: Former Smoker    Types: Cigarettes  . Smokeless tobacco: Never Used  . Tobacco comment: quit a long time ago about 25 years ago  Substance and Sexual Activity  . Alcohol use: Not Currently    Comment: 1/5 of liquor  . Drug use: No  . Sexual activity: Yes  Lifestyle  . Physical activity:    Days per week: Not on file    Minutes per session: Not on file  . Stress: Not on file    Relationships  . Social connections:    Talks on phone: Not on file    Gets together: Not on file    Attends religious service: Not on file    Active member of club or organization: Not on file    Attends meetings of clubs or organizations: Not on file    Relationship status: Not on file  Other Topics Concern  . 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 Hypertension Mother   . Hypertension Father    No Known Allergies Prior to Admission medications   Medication Sig Start Date End Date Taking? Authorizing Provider  carvedilol (COREG) 12.5 MG tablet TAKE 1 TABLET(12.5 MG) BY MOUTH TWICE DAILY WITH A MEAL Patient taking differently: Take 12.5 mg by mouth 2 (two) times daily.  01/04/18  Yes Weber, Sarah L, PA-C  gabapentin (NEURONTIN) 300 MG capsule Take 300  mg by mouth 2 (two) times daily.  11/12/17  Yes [provider]  Olmesartan-amLODIPine-HCTZ 40-10-25 MG TABS TAKE 1 TABLET BY MOUTH EVERY DAY Patient taking differently: Take 1 tablet by mouth daily.  01/04/18  Yes Weber, Sarah L, PA-C  sildenafil (REVATIO) 20 MG tablet Take 1-2 tablets (20-40 mg total) by mouth as needed. Patient taking differently: Take 20-40 mg by mouth daily as needed (erectile dysfunction).  01/19/18  Yes Weber, Sarah L, PA-C  sodium chloride (OCEAN) 0.65 % SOLN nasal spray Place 1 spray into both nostrils as needed for congestion.   Yes [provider]  tetrahydrozoline 0.05 % ophthalmic solution Place 2 drops into both eyes daily as needed (dry, red eys).   Yes [provider]  traMADol (ULTRAM) 50 MG tablet Take 50-100 mg by mouth every 6 (six) hours as needed for moderate pain.  09/14/17  Yes [provider]  diclofenac sodium  (VOLTAREN) 1 % GEL Apply 4 g topically 4 (four) times daily. Patient not taking: Reported on 02/17/2018 02/03/18   Domenic Moras, PA-C     All other systems have been reviewed and were otherwise negative with the exception of those mentioned in the HPI and as above.  Physical Exam: Vitals:   02/23/18 0631  BP: (!) 141/86  Pulse: (!) 49  Resp: 20  Temp: 97.7 F (36.5 C)  SpO2: 99%    Body mass index is 25.83 kg/m.  General: Alert, no acute distress Cardiovascular: No pedal edema Respiratory: No cyanosis, no use of accessory musculature Skin: No lesions in the area of chief complaint Neurologic: Sensation intact distally Psychiatric: Patient is competent for consent with normal mood and affect Lymphatic: No axillary or cervical lymphadenopathy  MUSCULOSKELETAL: + SLR on the right  Assessment/Plan: RIGHT LUMBAR 5 RADICULOPATHY  Plan for Procedure(s): ANTERIOR LUMBAR INTERBODY FUSION LUMBAR 5 - SACRUM 1 POSTERIOR SPINAL FUSION WITH INSTRUMENTATION AND ALLOGRAFT   Sinclair Ship, MD 02/23/2018 8:01 AM

## 2018-02-23 NOTE — Anesthesia Procedure Notes (Signed)
Procedure Name: Intubation Date/Time: 02/23/2018 8:43 AM Performed by: Barrington Ellison, CRNA Pre-anesthesia Checklist: Patient identified, Emergency Drugs available, Suction available and Patient being monitored Patient Re-evaluated:Patient Re-evaluated prior to induction Oxygen Delivery Method: Circle System Utilized Preoxygenation: Pre-oxygenation with 100% oxygen Induction Type: IV induction Ventilation: Mask ventilation without difficulty Laryngoscope Size: Mac and 4 Grade View: Grade I Tube type: Oral Tube size: 7.5 mm Number of attempts: 1 Airway Equipment and Method: Stylet and Oral airway Placement Confirmation: ETT inserted through vocal cords under direct vision,  positive ETCO2 and breath sounds checked- equal and bilateral Secured at: 23 cm Tube secured with: Tape Dental Injury: Teeth and Oropharynx as per pre-operative assessment

## 2018-02-23 NOTE — Anesthesia Procedure Notes (Signed)
Arterial Line Insertion Start/End10/01/2018 8:55 AM, 02/23/2018 9:05 AM Performed by: Barrington Ellison, CRNA, CRNA  Patient location: OR. Preanesthetic checklist: patient identified Patient sedated Left, radial was placed Catheter size: 20 G Hand hygiene performed  and maximum sterile barriers used  Allen's test indicative of satisfactory collateral circulation Attempts: 2 Procedure performed without using ultrasound guided technique. Following insertion, dressing applied and Biopatch. Post procedure assessment: normal  Patient tolerated the procedure well with no immediate complications.

## 2018-02-23 NOTE — Transfer of Care (Signed)
Immediate Anesthesia Transfer of Care Note  Patient: Gary Maddox  Procedure(s) Performed: ANTERIOR LUMBAR INTERBODY FUSION LUMBAR 5 - SACRUM 1 (N/A Abdomen) POSTERIOR SPINAL FUSION WITH INSTRUMENTATION AND ALLOGRAFT (N/A Back) ABDOMINAL EXPOSURE (N/A )  Patient Location: PACU  Anesthesia Type:General  Level of Consciousness: lethargic and responds to stimulation  Airway & Oxygen Therapy: Patient Spontanous Breathing and Patient connected to face mask oxygen  Post-op Assessment: Report given to RN  Post vital signs: Reviewed and stable  Last Vitals:  Vitals Value Taken Time  BP 89/59 02/23/2018  1:42 PM  Temp    Pulse 47 02/23/2018  1:46 PM  Resp 12 02/23/2018  1:46 PM  SpO2 91 % 02/23/2018  1:46 PM  Vitals shown include unvalidated device data.  Last Pain:  Vitals:   02/23/18 0646  TempSrc:   PainSc: 6       Patients Stated Pain Goal: 3 (25/85/27 7824)  Complications: No apparent anesthesia complications

## 2018-02-23 NOTE — Anesthesia Postprocedure Evaluation (Signed)
Anesthesia Post Note  Patient: Gary Maddox  Procedure(s) Performed: ANTERIOR LUMBAR INTERBODY FUSION LUMBAR 5 - SACRUM 1 (N/A Abdomen) POSTERIOR SPINAL FUSION WITH INSTRUMENTATION AND ALLOGRAFT (N/A Back) ABDOMINAL EXPOSURE (N/A )     Patient location during evaluation: PACU Anesthesia Type: General Level of consciousness: awake and alert Pain management: pain level controlled Vital Signs Assessment: post-procedure vital signs reviewed and stable Respiratory status: spontaneous breathing, nonlabored ventilation and respiratory function stable Cardiovascular status: blood pressure returned to baseline and stable Postop Assessment: no apparent nausea or vomiting Anesthetic complications: no    Last Vitals:  Vitals:   02/23/18 1535 02/23/18 1602  BP:  (!) 154/99  Pulse: (!) 52 (!) 47  Resp: 16 18  Temp:    SpO2: 93% 92%    Last Pain:  Vitals:   02/23/18 1515  TempSrc:   PainSc: 0-No pain                 Georgena Weisheit,W. EDMOND

## 2018-02-23 NOTE — OR Nursing (Signed)
While taking pt up to room I noted a small hematoma to the right of dressing that felt hard. After getting to room swelling became a littler bigger and was smaller than a golf ball but bigger than a quarter. Dr. Lynann Bologna was notified and stated to reinforce dressing if we need to and that he would come assess after he finishes current case. No drainage noted at this time. Ice applied to site.

## 2018-02-23 NOTE — Op Note (Signed)
    OPERATIVE REPORT  DATE OF SURGERY: 02/23/2018  PATIENT: Gary Maddox, 67 y.o. male MRN: [id number]  DOB: 1950/12/20  PRE-OPERATIVE DIAGNOSIS: Degenerative disc disease  POST-OPERATIVE DIAGNOSIS:  Same  PROCEDURE: Anterior exposure for L5-S1 disc surgery  SURGEON:  Curt Jews, M.D.  Co-surgeon for the exposure Dr. Phylliss Bob  ANESTHESIA: General  EBL: per anesthesia record  Total I/O In: 3007 [I.V.:2600] Out: 6226 [Urine:925; Blood:150]  BLOOD ADMINISTERED: none  DRAINS: none  SPECIMEN: none  COUNTS CORRECT:  YES  PATIENT DISPOSITION:  PACU - hemodynamically stable  PROCEDURE DETAILS: The patient was taken to the operating placed supine position where the area of the abdomen was prepped and draped you sterile fashion.  Crosstable lateral C arm projection was used to identify the level of the L5-S1 disc.  Incision was made from the midline to the left and carried down through the subcutaneous fat with electrocautery.  The anterior rectus sheath was opened with electrocautery.  The rectus muscle was mobilized circumferentially.  The retroperitoneal space was entered in the left lower quadrant and the intraperitoneal contents were mobilized to the right.  The left ureter was identified and mobilized to the right.  Dissection was tented down onto the L5-S1 disc.  The patient did have moderate atherosclerotic change of his iliac vessels.  The middle sacral vessels were clipped and divided.  Blunt dissection continued to give adequate exposure to the L5-S1 disc.  The Thompson retractor was brought onto the field and the reverse lip 150 blades were positioned to the right and left of the L5-S1 disc.  Malleable retractors were used for superior and inferior retraction.  A needle was placed in the disc and C-arm was brought back onto the field to confirm that this was indeed the L5-S1 disc.  The remainder of the procedure will be dictated as a separate note by Dr. Kathrynn Ducking, M.D., [facility] 2 02/23/2018 2:32 PM

## 2018-02-24 MED FILL — Thrombin (Recombinant) For Soln 20000 Unit: CUTANEOUS | Qty: 1 | Status: AC

## 2018-02-24 NOTE — Progress Notes (Signed)
Pt and wife given D/C instructions with Rx's, verbal understanding was provided. Pt's incision is clean and dry with no sign of infection. Pt's IV was removed prior to D/C. Pt received 3-n-1 and tub bench from [place] was arranged by CM prior to D/C. Pt D/C'd home via wheelchair per MD order. Pt is stable @ D/C and has no other needs at this time. Holli Humbles, RN

## 2018-02-24 NOTE — Evaluation (Signed)
Occupational Therapy Evaluation and Discharge Patient Details Name: Gary Maddox MRN: [id number] DOB: [birth year] Today's Date: 02/24/2018    History of Present Illness Anterior lumbar interbody fusion, L5-S1 and Posterior spinal fusion, L5-S1   Clinical Impression   This 67 yo male admitted and underwent above presents to acute OT with all education completed and post op handout issued, we will D/C from acute OT.    Follow Up Recommendations  Home health OT;Supervision/Assistance - 24 hour    Equipment Recommendations  3 in 1 bedside commode;Tub/shower seat       Precautions / Restrictions Precautions Precautions: Back Precaution Booklet Issued: Yes (comment) Precaution Comments: Can have brace off for night time trips to bathroom, sleeping, and bathing/dressing Required Braces or Orthoses: Spinal Brace Spinal Brace: Thoracolumbosacral orthotic;Applied in sitting position;Applied in standing position Restrictions Weight Bearing Restrictions: No      Mobility Bed Mobility Overal bed mobility: Needs Assistance Bed Mobility: Rolling;Sidelying to Sit Rolling: Supervision Sidelying to sit: Supervision       General bed mobility comments: VCs for technique  Transfers Overall transfer level: Needs assistance Equipment used: Straight cane Transfers: Sit to/from Stand Sit to Stand: Supervision                  ADL either performed or assessed with clinical judgement   ADL Overall ADL's : Needs assistance/impaired Eating/Feeding: Independent;Sitting   Grooming: Set up;Supervision/safety;Standing Grooming Details (indicate cue type and reason): Educated on using 2 cups to avoid bending over the sink Upper Body Bathing: Supervision/ safety;Set up;Sitting   Lower Body Bathing: Moderate assistance Lower Body Bathing Details (indicate cue type and reason): S sit<>stand (pt cannot get to his lower legs and feet without breaking precautions--wife to A ) Upper Body  Dressing : Supervision/safety;Set up;Sitting   Lower Body Dressing: Moderate assistance Lower Body Dressing Details (indicate cue type and reason): S sit<>stand (pt cannot get sock/shoe on right leg by himself without breaking back precautions--wife to A) Toilet Transfer: Supervision/safety;Ambulation;BSC Toilet Transfer Details (indicate cue type and reason): over toilet Toileting- Clothing Manipulation and Hygiene: Supervision/safety;Sit to/from stand Toileting - Clothing Manipulation Details (indicate cue type and reason): Educated on use of wet wipes for back peri care to avoid as much twisting as possible Tub/ Shower Transfer: Tub transfer;Min guard;Ambulation Tub/Shower Transfer Details (indicate cue type and reason): side -step into tub; educated pt and wife that pt cannot get down in tub to soak (cannot submerge his incisions)   General ADL Comments: Educated on not sitting for more than 20-30 minutes at a time (get up and walk around and then can sit for another 20-30 minutes), having things he uses/needs at counter top level, educated on not driving until MD clears him to     Vision Patient Visual Report: No change from baseline              Pertinent Vitals/Pain Pain Assessment: 0-10 Pain Score: 1  Pain Location: incisional Pain Descriptors / Indicators: Aching;Sore Pain Intervention(s): Limited activity within patient's tolerance;Monitored during session;Repositioned     Hand Dominance Right   Extremity/Trunk Assessment Upper Extremity Assessment Upper Extremity Assessment: Overall WFL for tasks assessed           Communication Communication Communication: No difficulties   Cognition Arousal/Alertness: Awake/alert Behavior During Therapy: WFL for tasks assessed/performed Overall Cognitive Status: Within Functional Limits for tasks assessed  Home Living Family/patient expects to be discharged  to:: Private residence Living Arrangements: Spouse/significant other Available Help at Discharge: Family;Available PRN/intermittently Type of Home: House             Bathroom Shower/Tub: Tub/shower unit;Curtain   Bathroom Toilet: Standard     Home Equipment: None          Prior Functioning/Environment Level of Independence: Independent                 OT Problem List: Decreased range of motion;Impaired balance (sitting and/or standing);Pain         OT Goals(Current goals can be found in the care plan section) Acute Rehab OT Goals Patient Stated Goal: home today  OT Frequency:                AM-PAC PT "6 Clicks" Daily Activity     Outcome Measure Help from another person eating meals?: None Help from another person taking care of personal grooming?: A Little Help from another person toileting, which includes using toliet, bedpan, or urinal?: A Little Help from another person bathing (including washing, rinsing, drying)?: A Lot Help from another person to put on and taking off regular upper body clothing?: A Little Help from another person to put on and taking off regular lower body clothing?: A Lot 6 Click Score: 17   End of Session Equipment Utilized During Treatment: ([place], 3n1) Nurse Communication: (HHOT and equipment needs)  Activity Tolerance: Patient tolerated treatment well Patient left: (sitting EOB eating breakfast)  OT Visit Diagnosis: Pain Pain - part of body: (back incisional (not front incistional))                Time: 0728-0809 OT Time Calculation (min): 41 min Charges:  OT General Charges $OT Visit: 1 Visit OT Evaluation $OT Eval Moderate Complexity: 1 Mod OT Treatments $Self Care/Home Management : 23-37 mins  [place], OTR/L Acute [employer] Pager [phone] Office [phone]

## 2018-02-24 NOTE — Care Management Note (Signed)
Case Management Note  Patient Details  Name: Gary Maddox MRN: [id number] Date of Birth: [birth year]  Subjective/Objective:                    Action/Plan: Case manager spoke with Zachery Dakins, patient's CM with [phone], discussed need for Home Health and DME. CM faxed orders to her at [phone]. Patient will discharge home with family support.    Expected Discharge Date:  02/24/18               Expected Discharge Plan:  [place]  In-House Referral:  NA  Discharge planning Services  CM Consult  Post Acute Care Choice:  Home Health Choice offered to:  NA(patient is under worker's comp. they will arrange [facility])  DME Arranged:  3-N-1, Tub bench DME Agency:  [place] Arranged:  PT, Nurse's Aide [place] Agency:  (being arranged by Zachery Dakins- worker's comp CM)  Status of Service:  Completed, signed off  If discussed at [place] of Stay Meetings, dates discussed:    Additional Comments:  Ninfa Meeker, RN 02/24/2018, 11:19 AM

## 2018-02-24 NOTE — Progress Notes (Signed)
    Patient doing well  Denies leg pain Minimal back discomfort Has been ambulating   Physical Exam: Vitals:   02/23/18 2316 02/24/18 0309  BP: (!) 145/87 (!) 146/90  Pulse: (!) 54 (!) 48  Resp: 18 18  Temp: 98.5 F (36.9 C) 98 F (36.7 C)  SpO2: 98% 98%   Patient appears comfortable Dressings in place NVI  POD #1 s/p L5/S1 A/P fusion, doing very well with minimal pain  - up with PT/OT, encourage ambulation - Percocet for pain, Valium for muscle spasms as needed - d/c home today with f/u in 2 weeks

## 2018-02-24 NOTE — Evaluation (Signed)
Physical Therapy Evaluation and Discharge Patient Details Name: Gary Maddox MRN: [id number] DOB: 14-Sep-1950 Today's Date: 02/24/2018   History of Present Illness  Pt is a 67 y/o male who presents s/p anterior lumbar interbody fusion, L5-S1 and Posterior spinal fusion, L5-S1. PMH significant for Hep C, HTN, DM, previous knee surgery, previous foot surgery.   Clinical Impression  Patient evaluated by Physical Therapy with no further acute PT needs identified. All education has been completed and the patient has no further questions. At the time of PT eval pt was able to perform transfers and ambulation with supervision to modified independence and [facility] for balance support. Pt and wife were educated on car transfer, precautions, activity progression, and general safety with mobility around the home. See below for any follow-up Physical Therapy or equipment needs. PT is signing off. Thank you for this referral.     Follow Up Recommendations HHPT;Supervision for mobility/OOB    Equipment Recommendations  None recommended by PT    Recommendations for Other Services       Precautions / Restrictions Precautions Precautions: Back Precaution Booklet Issued: Yes (comment) Precaution Comments: Can have brace off for night time trips to bathroom, sleeping, and bathing/dressing Required Braces or Orthoses: Spinal Brace Spinal Brace: Thoracolumbosacral orthotic;Applied in sitting position;Applied in standing position Restrictions Weight Bearing Restrictions: No      Mobility  Bed Mobility Overal bed mobility: Needs Assistance Bed Mobility: Rolling;Sidelying to Sit Rolling: Supervision Sidelying to sit: Supervision       General bed mobility comments: Pt was received ambulating in room. Pt was instructed in proper log roll technique - wife present.   Transfers Overall transfer level: Needs assistance Equipment used: Straight cane Transfers: Sit to/from Stand Sit to Stand:  Supervision         General transfer comment: VC's for hand placement on seated surface for safety. Pt was cued for improved posture (maintaining neutral spine) as he powered up to full stand.   Ambulation/Gait Ambulation/Gait assistance: Modified independent (Device/Increase time) Gait Distance (Feet): 400 Feet Assistive device: Straight cane Gait Pattern/deviations: Step-through pattern;Decreased stride length;Trunk flexed Gait velocity: Decreased Gait velocity interpretation: 1.31 - 2.62 ft/sec, indicative of limited community ambulator General Gait Details: VC's for improved posture. No unsteadiness or LOB noted throughout gait training. Pt had good sequencing with cane, however wanted to hold his free hand at low back, stating it helps him move his L leg. Encouraged a natural arm swing with the free arm.   Stairs            Wheelchair Mobility    Modified Rankin (Stroke Patients Only)       Balance Overall balance assessment: Needs assistance Sitting-balance support: Feet supported;No upper extremity supported Sitting balance-Leahy Scale: Good     Standing balance support: Single extremity supported;During functional activity Standing balance-Leahy Scale: Fair                               Pertinent Vitals/Pain Pain Assessment: 0-10 Pain Score: 2  Pain Location: incisional Pain Descriptors / Indicators: Aching;Sore Pain Intervention(s): Monitored during session    Home Living Family/patient expects to be discharged to:: Private residence Living Arrangements: Spouse/significant other Available Help at Discharge: Family;Available PRN/intermittently Type of Home: House Home Access: Level entry     Home Layout: One level Home Equipment: None      Prior Function Level of Independence: Independent  Hand Dominance   Dominant Hand: Right    Extremity/Trunk Assessment   Upper Extremity Assessment Upper Extremity  Assessment: Overall WFL for tasks assessed    Lower Extremity Assessment Lower Extremity Assessment: Overall WFL for tasks assessed    Cervical / Trunk Assessment Cervical / Trunk Assessment: Other exceptions Cervical / Trunk Exceptions: s/p surgery  Communication   Communication: No difficulties  Cognition Arousal/Alertness: Awake/alert Behavior During Therapy: WFL for tasks assessed/performed Overall Cognitive Status: Within Functional Limits for tasks assessed                                        General Comments      Exercises     Assessment/Plan    PT Assessment [place] does not need any further PT services  PT Problem List         PT Treatment Interventions      PT Goals (Current goals can be found in the Care Plan section)  Acute Rehab PT Goals Patient Stated Goal: home today PT Goal Formulation: All assessment and education complete, DC therapy    Frequency     Barriers to discharge        Co-evaluation               AM-PAC PT "6 Clicks" Daily Activity  Outcome Measure Difficulty turning over in bed (including adjusting bedclothes, sheets and blankets)?: None Difficulty moving from lying on back to sitting on the side of the bed? : None Difficulty sitting down on and standing up from a chair with arms (e.g., wheelchair, bedside commode, etc,.)?: A Little Help needed moving to and from a bed to chair (including a wheelchair)?: None Help needed walking in hospital room?: None Help needed climbing 3-5 steps with a railing? : A Little 6 Click Score: 22    End of Session Equipment Utilized During Treatment: Back brace Activity Tolerance: Patient tolerated treatment well Patient left: with call bell/phone within reach;with family/visitor present(Sitting EOB) Nurse Communication: Mobility status PT Visit Diagnosis: Unsteadiness on feet (R26.81);Pain Pain - part of body: (back)    Time: 5916-3846 PT Time Calculation (min) (ACUTE  ONLY): 13 min   Charges:   PT Evaluation $PT Eval Low Complexity: 1 Low          Gary Maddox, PT, DPT Acute Rehabilitation Services Pager: [phone] Office: [phone]   Thelma Comp 02/24/2018, 11:32 AM

## 2018-02-24 NOTE — Progress Notes (Addendum)
[facility]   Subjective  - Sitting up eating.  States he can't even tell he has an incision on his abdomin.   Objective (!) 144/89 (!) 52 97.7 F (36.5 C) (Oral) 16 99%  Intake/Output Summary (Last 24 hours) at 02/24/2018 0819 Last data filed at 02/24/2018 0518 Gross per 24 hour  Intake 3100 ml  Output 1075 ml  Net 2025 ml    Ambulating in the halls No discomfort with sitting and standing Heart RRR Lungs non labored breathing  Assessment/Planning: POD # 1 anterior lumbar abdominal exposure  Stable disposition from a vascular stand point.  F/U as needed.  Roxy Horseman 02/24/2018 8:19 AM --  Laboratory Lab Results: No results for input(s): WBC, HGB, HCT, PLT in the last 72 hours. BMET No results for input(s): NA, K, CL, CO2, GLUCOSE, BUN, CREATININE, CALCIUM in the last 72 hours.  COAG Lab Results  Component Value Date   INR 1.03 02/17/2018   INR 1.14 09/29/2017   INR 1.04 09/22/2016   No results found for: PTT  I have examined the patient, reviewed and agree with above.  Very comfortable.  Able for discharge today  Curt Jews, MD 02/24/2018 12:38 PM

## 2018-02-25 MED FILL — Sodium Chloride IV Soln 0.9%: INTRAVENOUS | Qty: 1000 | Status: AC

## 2018-02-25 MED FILL — Heparin Sodium (Porcine) Inj 1000 Unit/ML: INTRAMUSCULAR | Qty: 30 | Status: AC

## 2018-02-28 ENCOUNTER — Encounter (HOSPITAL_COMMUNITY): Payer: Self-pay | Admitting: Orthopedic Surgery

## 2018-03-01 ENCOUNTER — Encounter (HOSPITAL_COMMUNITY): Payer: Self-pay | Admitting: Orthopedic Surgery

## 2018-03-10 NOTE — Discharge Summary (Signed)
Patient ID: Gary Maddox MRN: [id number] DOB/AGE: November 15, 1950 67 y.o.  Admit date: 02/23/2018 Discharge date: 02/24/2018  Admission Diagnoses:  Active Problems:   Radiculopathy   Discharge Diagnoses:  Same  Past Medical History:  Diagnosis Date  . ABUSE, ALCOHOL, IN REMISSION 12/24/2006   Qualifier: Diagnosis of  By: Leward Quan MD, Pamala Hurry    . Diabetes mellitus without complication ([place])   . Elevated LDL cholesterol level 10/05/2016  . Essential hypertension 12/21/2006   Qualifier: Diagnosis of  By: Leward Quan MD, Pamala Hurry    . GERD 12/21/2006   Qualifier: Diagnosis of  By: Leward Quan MD, Pamala Hurry    . HEPATITIS C, CHRONIC VIRAL, W/O HEPATIC COMA 12/24/2006   Qualifier: Diagnosis of  By: Leward Quan MD, Pamala Hurry      Surgeries: Procedure(s): ANTERIOR LUMBAR INTERBODY FUSION LUMBAR 5 - SACRUM 1 POSTERIOR SPINAL FUSION WITH INSTRUMENTATION AND ALLOGRAFT ABDOMINAL EXPOSURE on 02/23/2018   Consultants: Treatment Team:  Rosetta Posner, MD  Discharged Condition: Improved  Hospital Course: Gary Maddox is an 67 y.o. male who was admitted 02/23/2018 for operative treatment of radiculopathy. Patient has severe unremitting pain that affects sleep, daily activities, and work/hobbies. After pre-op clearance the patient was taken to the operating room on 02/23/2018 and underwent  Procedure(s): ANTERIOR LUMBAR INTERBODY FUSION LUMBAR 5 - SACRUM 1 POSTERIOR SPINAL FUSION WITH INSTRUMENTATION AND ALLOGRAFT ABDOMINAL EXPOSURE.    Patient was given perioperative antibiotics:  Anti-infectives (From admission, onward)   Start     Dose/Rate Route Frequency Ordered Stop   02/23/18 1700  ceFAZolin (ANCEF) IVPB 2g/100 mL premix     2 g 200 mL/hr over 30 Minutes Intravenous Every 8 hours 02/23/18 1552 02/24/18 0128   02/23/18 0630  ceFAZolin (ANCEF) IVPB 2g/100 mL premix     2 g 200 mL/hr over 30 Minutes Intravenous On call to O.R. 02/23/18 1749 02/23/18 0850       Patient was given sequential  compression devices, early ambulation to prevent DVT.  Patient benefited maximally from hospital stay and there were no complications.    Recent vital signs: BP (!) 144/89 (BP Location: Right Arm)   Pulse (!) 52   Temp 97.7 F (36.5 C) (Oral)   Resp 16   Ht 5\' 10"  (1.778 m)   Wt 81.6 kg   SpO2 99%   BMI 25.83 kg/m    Discharge Medications:   Allergies as of 02/24/2018   No Known Allergies     Medication List    TAKE these medications   carvedilol 12.5 MG tablet Commonly known as:  COREG TAKE 1 TABLET(12.5 MG) BY MOUTH TWICE DAILY WITH A MEAL What changed:  See the new instructions.   gabapentin 300 MG capsule Commonly known as:  NEURONTIN Take 300 mg by mouth 2 (two) times daily.   Olmesartan-amLODIPine-HCTZ 40-10-25 MG Tabs TAKE 1 TABLET BY MOUTH EVERY DAY   sildenafil 20 MG tablet Commonly known as:  REVATIO Take 1-2 tablets (20-40 mg total) by mouth as needed. What changed:    when to take this  reasons to take this   sodium chloride 0.65 % Soln nasal spray Commonly known as:  OCEAN Place 1 spray into both nostrils as needed for congestion.   tetrahydrozoline 0.05 % ophthalmic solution Place 2 drops into both eyes daily as needed (dry, red eys).       Diagnostic Studies: Dg Lumbar Spine Complete  Result Date: 02/23/2018 CLINICAL DATA:  L5-S1 AL IF EXAM: DG C-ARM 61-120 MIN; LUMBAR SPINE -  COMPLETE 4+ VIEW COMPARISON:  None FLUOROSCOPY TIME:  3 minutes 9 seconds Images submitted: 4 FINDINGS: Five lumbar vertebra present on radiographs of 02/03/2018. Images demonstrate placement of an anterior plate and screws at Z6-X0. Intervening disc prosthesis present. No fracture or subluxation. IMPRESSION: Anterior fusion L5-S1. Electronically Signed   By: Lavonia Dana M.D.   On: 02/23/2018 13:34   Dg C-arm 1-60 Min  Result Date: 02/23/2018 CLINICAL DATA:  L5-S1 AL IF EXAM: DG C-ARM 61-120 MIN; LUMBAR SPINE - COMPLETE 4+ VIEW COMPARISON:  None FLUOROSCOPY TIME:  3  minutes 9 seconds Images submitted: 4 FINDINGS: Five lumbar vertebra present on radiographs of 02/03/2018. Images demonstrate placement of an anterior plate and screws at R6-E4. Intervening disc prosthesis present. No fracture or subluxation. IMPRESSION: Anterior fusion L5-S1. Electronically Signed   By: Lavonia Dana M.D.   On: 02/23/2018 13:34   Dg C-arm 1-60 Min  Result Date: 02/23/2018 CLINICAL DATA:  L5-S1 AL IF EXAM: DG C-ARM 61-120 MIN; LUMBAR SPINE - COMPLETE 4+ VIEW COMPARISON:  None FLUOROSCOPY TIME:  3 minutes 9 seconds Images submitted: 4 FINDINGS: Five lumbar vertebra present on radiographs of 02/03/2018. Images demonstrate placement of an anterior plate and screws at V4-U9. Intervening disc prosthesis present. No fracture or subluxation. IMPRESSION: Anterior fusion L5-S1. Electronically Signed   By: Lavonia Dana M.D.   On: 02/23/2018 13:34   Dg C-arm 1-60 Min  Result Date: 02/23/2018 CLINICAL DATA:  L5-S1 AL IF EXAM: DG C-ARM 61-120 MIN; LUMBAR SPINE - COMPLETE 4+ VIEW COMPARISON:  None FLUOROSCOPY TIME:  3 minutes 9 seconds Images submitted: 4 FINDINGS: Five lumbar vertebra present on radiographs of 02/03/2018. Images demonstrate placement of an anterior plate and screws at W1-X9. Intervening disc prosthesis present. No fracture or subluxation. IMPRESSION: Anterior fusion L5-S1. Electronically Signed   By: Lavonia Dana M.D.   On: 02/23/2018 13:34   Dg Or Local Abdomen  Result Date: 02/23/2018 CLINICAL DATA:  Postop.  Instrument count. EXAM: OR LOCAL ABDOMEN COMPARISON:  None. FINDINGS: No evidence of a radiopaque surgical instrument, sponge or needle. IMPRESSION: No evidence of a retained surgical instrument. These results were called by telephone at the time of interpretation on 02/23/2018 at 11:10 am to the operating room, with results verbally acknowledged. Electronically Signed   By: Lajean Manes M.D.   On: 02/23/2018 11:11    Disposition: Home   POD #1 s/p L5/S1 A/P fusion, doing  very well with minimal pain  - up with PT/OT, encourage ambulation - Percocet for pain, Valium for muscle spasms as needed -Written scripts for pain signed and in chart -D/C instructions sheet printed and in chart -D/C today  -F/U in office 2 weeks   Signed: Lennie Muckle Debralee Braaksma 03/10/2018, 9:18 AM

## 2018-08-30 IMAGING — US US RENAL
1 series · 14 of 25 positions shown · non-contrast
Comparison: None.

CLINICAL DATA: Elevated creatinine

EXAM:
RENAL / URINARY TRACT ULTRASOUND COMPLETE

[Series 1: us renal · 0.26mm/px · 14 of 27 slices shown]
[im 1/27]
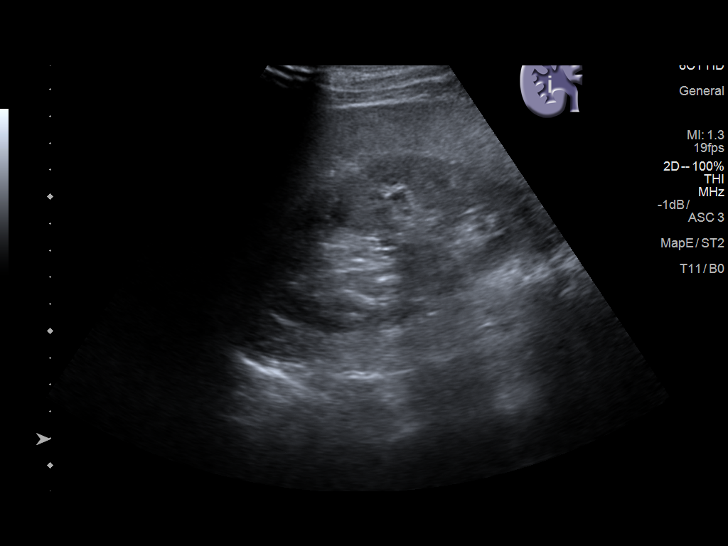
[im 3/27]
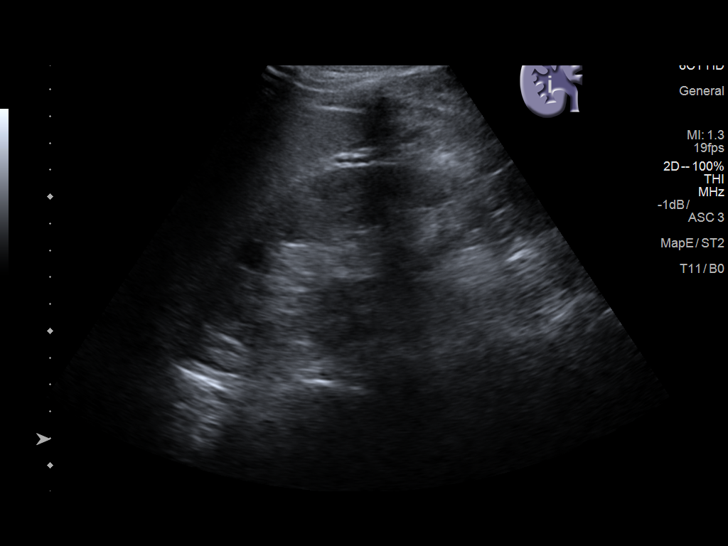
[im 5/27]
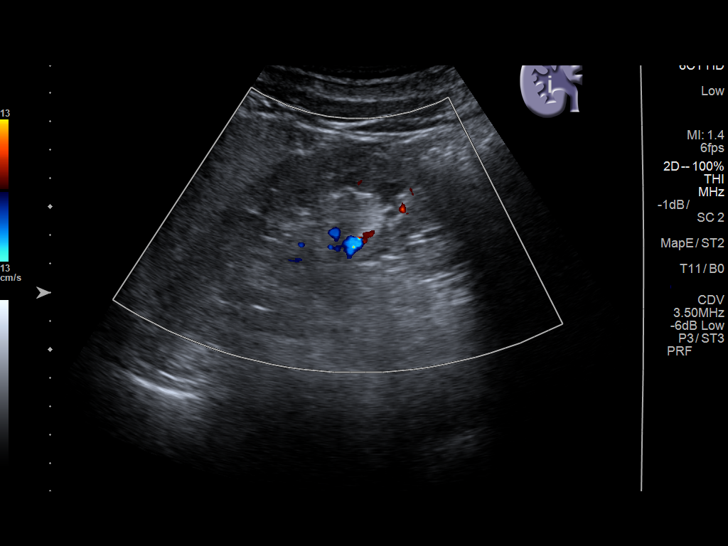
[im 7/27]
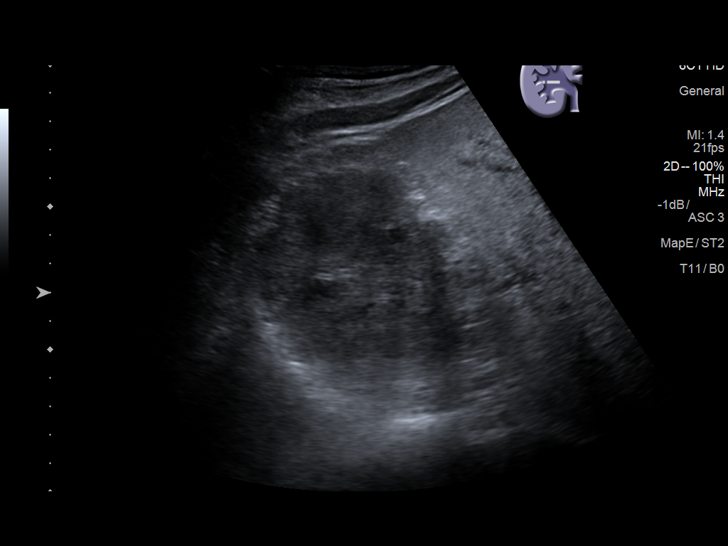
[im 9/27]
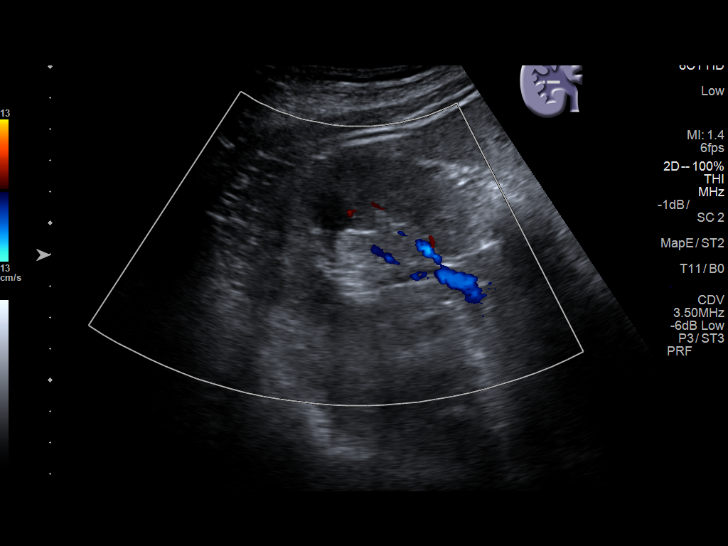
[im 10/27]
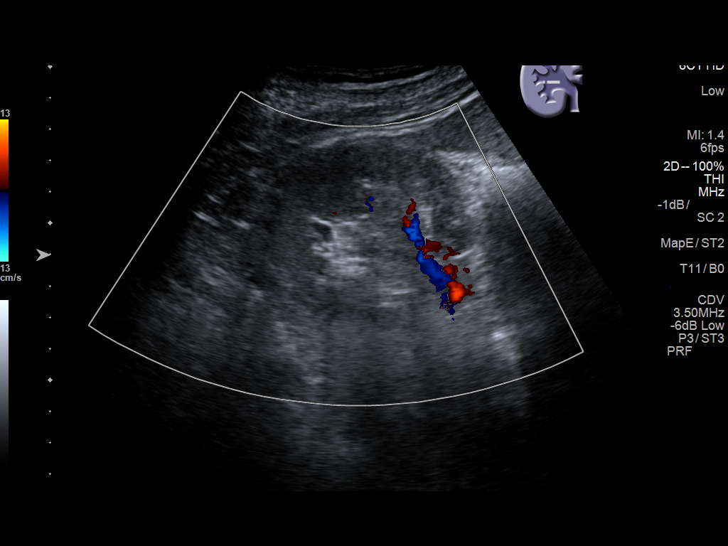
[im 12/27]
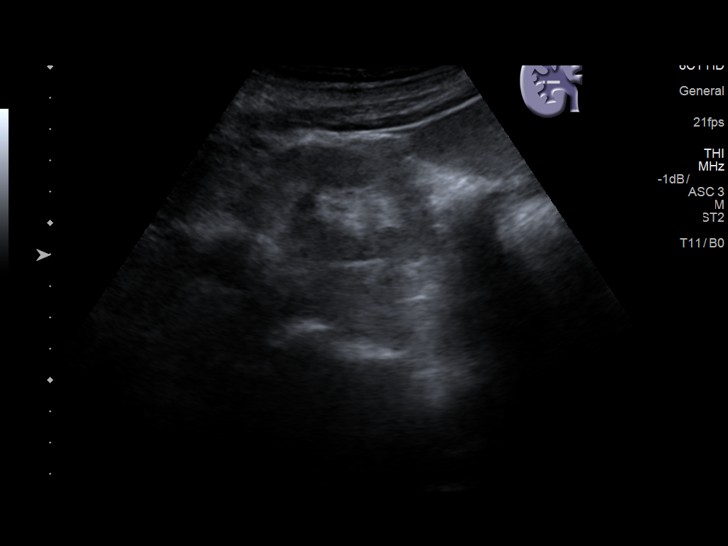
[im 15/27]
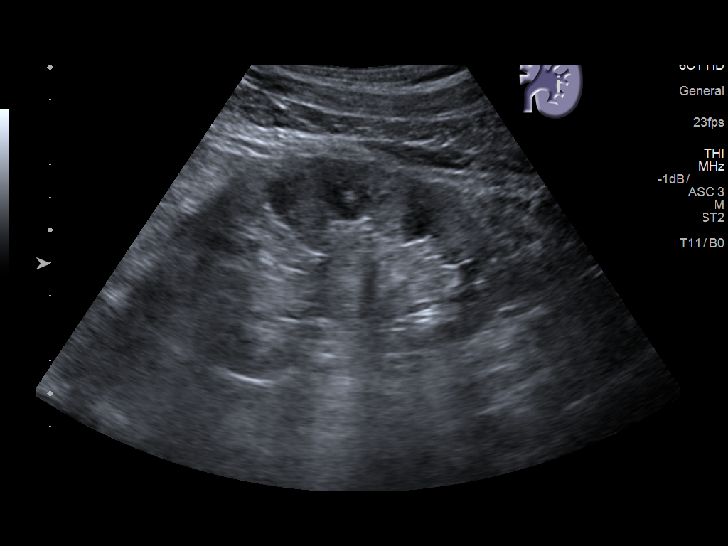
[im 17/27]
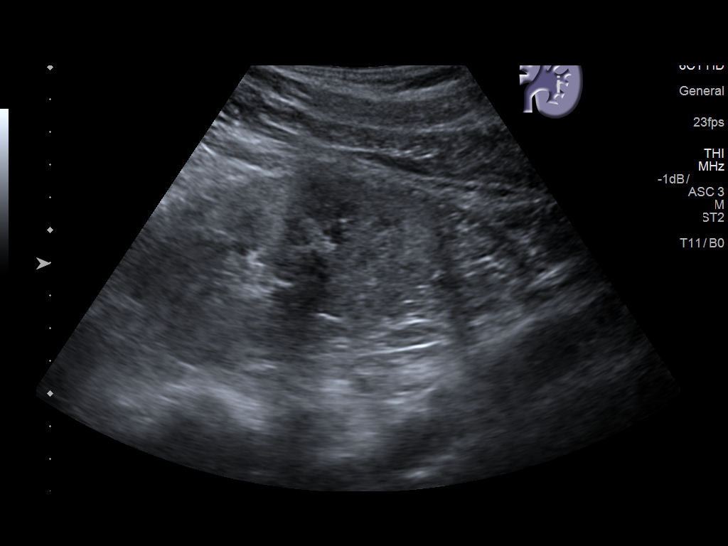
[im 18/27]
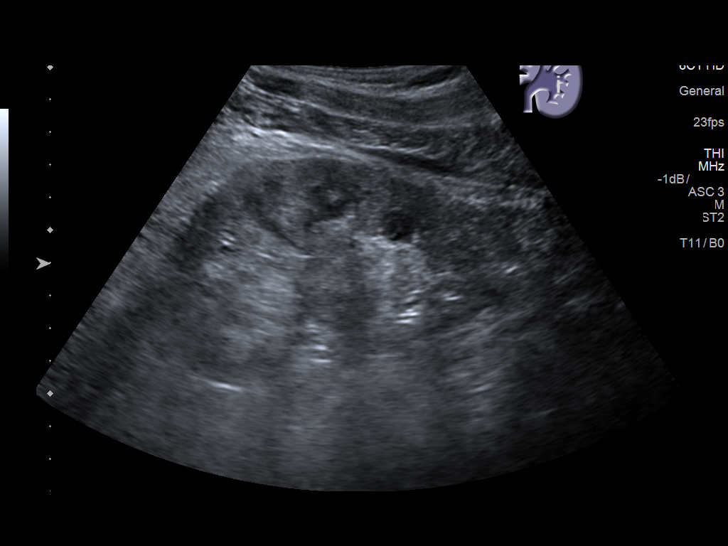
[im 20/27]
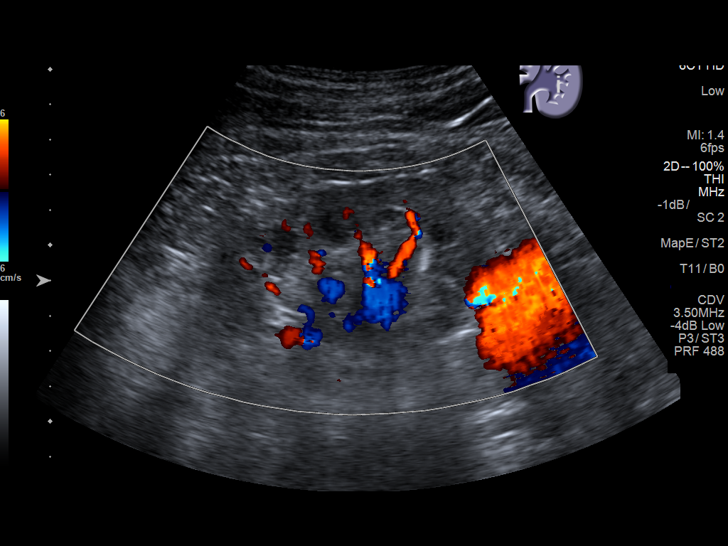
[im 22/27]
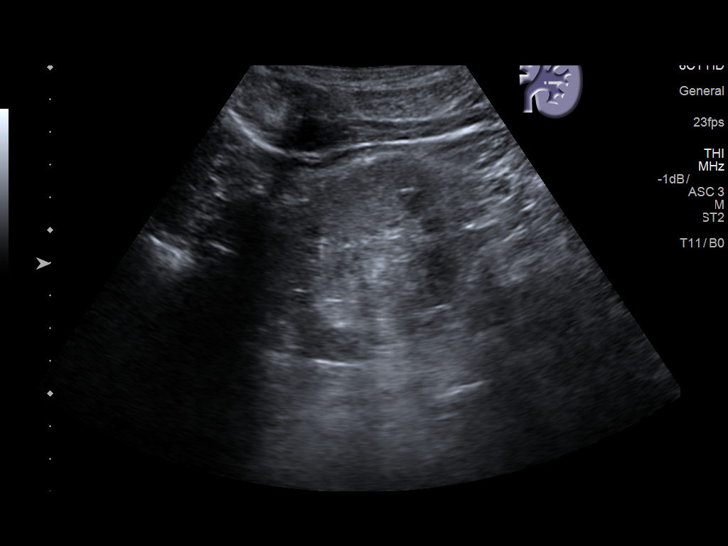
[im 24/27]
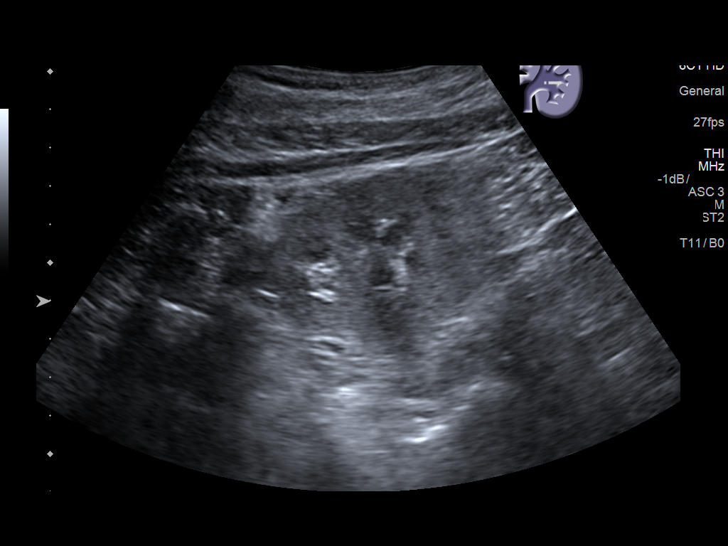
[im 27/27]
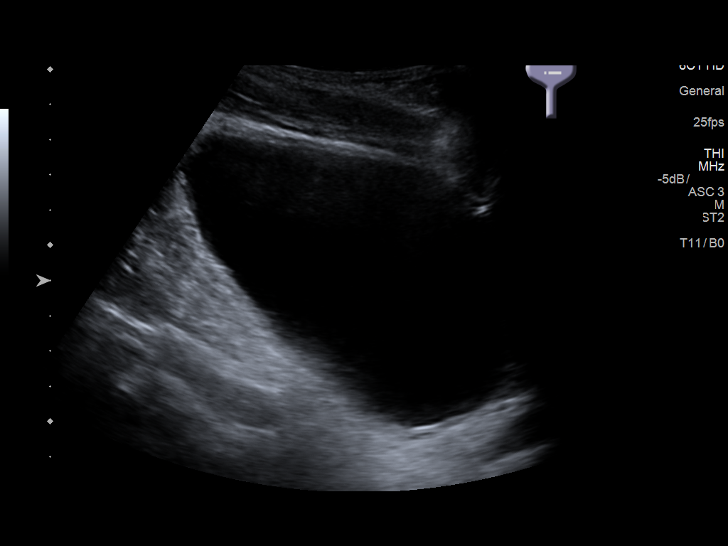

[14 of 25 positions shown; findings below may reference images not displayed]

FINDINGS: Right Kidney:

Length: 10.8 cm. No hydronephrosis or mass. Mildly increased
echotexture.

Left Kidney:

Length: 11.4 cm. No hydronephrosis or mass. Mildly increased
echotexture.

Bladder:

Appears normal for degree of bladder distention.
IMPRESSION: Increased echotexture compatible with chronic medical renal disease.
No acute findings.

## 2019-01-25 ENCOUNTER — Other Ambulatory Visit: Payer: Self-pay | Admitting: Physician Assistant

## 2019-01-25 DIAGNOSIS — Z136 Encounter for screening for cardiovascular disorders: Secondary | ICD-10-CM

## 2019-06-15 ENCOUNTER — Other Ambulatory Visit: Payer: Self-pay | Admitting: Physician Assistant

## 2019-06-15 DIAGNOSIS — Z136 Encounter for screening for cardiovascular disorders: Secondary | ICD-10-CM

## 2019-06-15 DIAGNOSIS — Z87891 Personal history of nicotine dependence: Secondary | ICD-10-CM

## 2019-06-27 ENCOUNTER — Ambulatory Visit
Admission: RE | Admit: 2019-06-27 | Discharge: 2019-06-27 | Disposition: A | Discharge status: Home / Self Care | Payer: Medicare Other | Source: Ambulatory Visit | Attending: Physician Assistant | Admitting: Physician Assistant

## 2019-06-27 DIAGNOSIS — Z136 Encounter for screening for cardiovascular disorders: Secondary | ICD-10-CM

## 2019-06-27 DIAGNOSIS — Z87891 Personal history of nicotine dependence: Secondary | ICD-10-CM

## 2019-09-17 ENCOUNTER — Emergency Department (HOSPITAL_COMMUNITY)
Admission: EM | Admit: 2019-09-17 | Discharge: 2019-09-18 | Disposition: A | Discharge status: Home / Self Care | Payer: Medicare Other | Attending: Emergency Medicine | Admitting: Emergency Medicine

## 2019-09-17 ENCOUNTER — Other Ambulatory Visit: Payer: Self-pay

## 2019-09-17 ENCOUNTER — Encounter (HOSPITAL_COMMUNITY): Payer: Self-pay | Admitting: Emergency Medicine

## 2019-09-17 DIAGNOSIS — R682 Dry mouth, unspecified: Secondary | ICD-10-CM | POA: Diagnosis present

## 2019-09-17 DIAGNOSIS — Z5321 Procedure and treatment not carried out due to patient leaving prior to being seen by health care provider: Secondary | ICD-10-CM | POA: Diagnosis not present

## 2019-09-17 NOTE — ED Notes (Signed)
Pt LWBS. Pt was irritated with how long it was taking to be seen.

## 2019-09-17 NOTE — ED Triage Notes (Signed)
Pt states for the past 3 days is making to much saliva and is very uncomfortable. Denies any other symptom or pain.

## 2019-11-03 ENCOUNTER — Encounter: Payer: Self-pay | Admitting: Neurology
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
## 2020-01-08 ENCOUNTER — Other Ambulatory Visit (INDEPENDENT_AMBULATORY_CARE_PROVIDER_SITE_OTHER): Payer: Medicare Other

## 2020-01-08 ENCOUNTER — Ambulatory Visit (INDEPENDENT_AMBULATORY_CARE_PROVIDER_SITE_OTHER): Payer: Medicare Other | Admitting: Neurology

## 2020-01-08 ENCOUNTER — Other Ambulatory Visit: Payer: Self-pay

## 2020-01-08 ENCOUNTER — Encounter: Payer: Self-pay | Admitting: Neurology

## 2020-01-08 VITALS — BP 132/86 | HR 50 | Ht 72.0 in | Wt 162.0 lb

## 2020-01-08 DIAGNOSIS — R270 Ataxia, unspecified: Secondary | ICD-10-CM | POA: Diagnosis not present

## 2020-01-08 DIAGNOSIS — F1090 Alcohol use, unspecified, uncomplicated: Secondary | ICD-10-CM

## 2020-01-08 DIAGNOSIS — Z5181 Encounter for therapeutic drug level monitoring: Secondary | ICD-10-CM

## 2020-01-08 DIAGNOSIS — Z7289 Other problems related to lifestyle: Secondary | ICD-10-CM

## 2020-01-08 DIAGNOSIS — B182 Chronic viral hepatitis C: Secondary | ICD-10-CM

## 2020-01-08 DIAGNOSIS — Z789 Other specified health status: Secondary | ICD-10-CM

## 2020-01-08 DIAGNOSIS — R413 Other amnesia: Secondary | ICD-10-CM | POA: Diagnosis not present

## 2020-01-08 LAB — CBC
HCT: 40 % (ref 39.0–52.0)
Hemoglobin: 13.7 g/dL (ref 13.0–17.0)
MCHC: 34.3 g/dL (ref 30.0–36.0)
MCV: 104 fl — ABNORMAL HIGH (ref 78.0–100.0)
Platelets: 203 10*3/uL (ref 150.0–400.0)
RBC: 3.85 Mil/uL — ABNORMAL LOW (ref 4.22–5.81)
RDW: 13 % (ref 11.5–15.5)
WBC: 5.7 10*3/uL (ref 4.0–10.5)

## 2020-01-08 LAB — VITAMIN B12: Vitamin B-12: 331 pg/mL (ref 211–911)

## 2020-01-08 NOTE — Patient Instructions (Signed)
You have been referred for a neurocognitive evaluation in our office.   The evaluation has two parts.   . The first part of the evaluation is a clinical interview with the neuropsychologist (Dr. Melvyn Novas or Dr. Nicole Kindred). Please bring someone with you to this appointment if possible, as it is helpful for the doctor to hear from both you and another adult who knows you well.   . The second part of the evaluation is testing with the doctor's technician Hinton Dyer or Maudie Mercury). The testing includes a variety of tasks- mostly question-and-answer, some paper-and-pencil. There is nothing you need to do to prepare for this appointment, but having a good night's sleep prior to the testing, taking medications as you normally would, and bringing eyeglasses and hearing aids (if you wear them), is advised. Please make sure that you wear a mask to the appointment.  Please note: We have to reserve several hours of the neuropsychologist's time and the psychometrician's time for your evaluation appointment. As such, please note that there is a No-Show fee of $100. If you are unable to attend any of your appointments, please contact our office as soon as possible to reschedule.   A referral to [facility] has been placed for your MRI someone will contact you directly to schedule your appt. They are located at [place]. Please contact them directly by calling [phone] with any questions regarding your referral.

## 2020-01-24 ENCOUNTER — Ambulatory Visit (HOSPITAL_COMMUNITY)
Admission: EM | Admit: 2020-01-24 | Discharge: 2020-01-24 | Disposition: A | Discharge status: Home / Self Care | Payer: Medicare Other | Attending: Family Medicine | Admitting: Family Medicine

## 2020-01-24 ENCOUNTER — Other Ambulatory Visit: Payer: Self-pay

## 2020-01-24 ENCOUNTER — Encounter (HOSPITAL_COMMUNITY): Payer: Self-pay

## 2020-01-24 DIAGNOSIS — T161XXA Foreign body in right ear, initial encounter: Secondary | ICD-10-CM

## 2020-01-24 NOTE — ED Triage Notes (Signed)
Pt c/o cotton from swab stuck in right ear for approx 1 week, c/o pain to same. Denies fever, chills, n/v/d, dizziness.

## 2020-01-24 NOTE — ED Provider Notes (Signed)
[facility]   [id number] 01/24/20 Arrival Time: 7793  ASSESSMENT & PLAN:  1. Acute foreign body of right ear canal, initial encounter     FB removed with flushing ear. Reports immed relief. No pain. May f/u as needed.  Reviewed expectations re: course of current medical issues. Questions answered. Outlined signs and symptoms indicating need for more acute intervention. Patient verbalized understanding. After Visit Summary given.   SUBJECTIVE: History from: patient.  Gary Maddox is a 69 y.o. male who feels he has Q-tip cotton stuck in R ear; approx one week. No pain. Feels affecting his hearing. No bleeding or drainage. Otherwise well. OTC treatment: none.  Social History   Tobacco Use  Smoking Status Former Smoker  . Types: Cigarettes  Smokeless Tobacco Never Used  Tobacco Comment   quit a long time ago about 25 years ago      OBJECTIVE:  Vitals:   01/24/20 1125  BP: 129/81  Pulse: (!) 52  Resp: 18  Temp: 97.9 F (36.6 C)  TempSrc: Oral  SpO2: 98%     General appearance: alert; NAD Ear Canal: apparent small piece of cotton mixed with cerumen in distal R ear canal TM: right appears normal Neck: supple without LAD Skin: warm and dry Psychological: alert and cooperative; normal mood and affect  No Known Allergies  Past Medical History:  Diagnosis Date  . Alcohol abuse   . Diabetes mellitus without complication ([place])   . Elevated LDL cholesterol level 10/05/2016  . Essential hypertension 12/21/2006   Qualifier: Diagnosis of  By: Leward Quan MD, Pamala Hurry    . GERD 12/21/2006   Qualifier: Diagnosis of  By: Leward Quan MD, Pamala Hurry    . HEPATITIS C, CHRONIC VIRAL, W/O HEPATIC COMA 12/24/2006   Qualifier: Diagnosis of  By: Leward Quan MD, Pamala Hurry     Family History  Problem Relation Age of Onset  . Hypertension Mother   . Hypertension Father    Social History   Socioeconomic History  . Marital status: Married    Spouse name: Not on file  .  Number of children: 8  . Years of education: Not on file  . Highest education level: Not on file  Occupational History  . Occupation: sports maintanence  Tobacco Use  . Smoking status: Former Smoker    Types: Cigarettes  . Smokeless tobacco: Never Used  . Tobacco comment: quit a long time ago about 25 years ago  Vaping Use  . Vaping Use: Never used  Substance and Sexual Activity  . Alcohol use: Yes    Comment: 1/2 pint per week  . Drug use: No  . Sexual activity: Yes  Other Topics Concern  . 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 Difficulty of Paying Living Expenses: Not on file  Food Insecurity:   . Worried About Charity fundraiser in the Last Year: Not on file  . Ran Out of Food in the Last Year: Not on file  Transportation Needs:   . Lack of Transportation (Medical): Not on file  . Lack of Transportation (Non-Medical): Not on file  Physical Activity:   .  Days of Exercise per Week: Not on file  . Minutes of Exercise per Session: Not on file  Stress:   . Feeling of Stress : Not on file  Social Connections:   . Frequency of Communication with Friends and Family: Not on file  . Frequency of Social Gatherings with Friends and Family: Not on file  . Attends Religious Services: Not on file  . Active Member of Clubs or Organizations: Not on file  . Attends Archivist Meetings: Not on file  . Marital Status: Not on file  Intimate Partner Violence:   . Fear of Current or Ex-Partner: Not on file  . Emotionally Abused: Not on file  . Physically Abused: Not on file  . Sexually Abused: Not on file            Vanessa Kick, MD 01/24/20 1250

## 2020-02-03 ENCOUNTER — Ambulatory Visit
Admission: RE | Admit: 2020-02-03 | Discharge: 2020-02-03 | Disposition: A | Discharge status: Home / Self Care | Payer: Medicare Other | Source: Ambulatory Visit | Attending: Neurology | Admitting: Neurology

## 2020-02-03 ENCOUNTER — Other Ambulatory Visit: Payer: Self-pay

## 2020-02-06 ENCOUNTER — Telehealth: Payer: Self-pay | Admitting: Neurology

## 2020-02-06 NOTE — Telephone Encounter (Signed)
Advised patient I didn't see any results, to call back on Thursday.

## 2020-02-12 ENCOUNTER — Ambulatory Visit: Payer: Medicare Other | Admitting: Neurology

## 2020-02-14 ENCOUNTER — Encounter: Payer: Self-pay | Admitting: Counselor

## 2020-02-14 ENCOUNTER — Ambulatory Visit: Payer: Medicare Other | Admitting: Psychology

## 2020-02-14 ENCOUNTER — Other Ambulatory Visit: Payer: Self-pay

## 2020-02-14 ENCOUNTER — Ambulatory Visit (INDEPENDENT_AMBULATORY_CARE_PROVIDER_SITE_OTHER): Payer: Medicare Other | Admitting: Counselor

## 2020-02-14 DIAGNOSIS — F09 Unspecified mental disorder due to known physiological condition: Secondary | ICD-10-CM | POA: Diagnosis not present

## 2020-02-14 DIAGNOSIS — I6781 Acute cerebrovascular insufficiency: Secondary | ICD-10-CM

## 2020-02-14 DIAGNOSIS — Z7289 Other problems related to lifestyle: Secondary | ICD-10-CM | POA: Diagnosis not present

## 2020-02-14 DIAGNOSIS — F1090 Alcohol use, unspecified, uncomplicated: Secondary | ICD-10-CM

## 2020-02-14 DIAGNOSIS — Z789 Other specified health status: Secondary | ICD-10-CM

## 2020-02-14 NOTE — Progress Notes (Signed)
° °  Psychometrist Note   Cognitive testing was administered to Gary Maddox by Milana Kidney, B.S. (Technician) under the supervision of Alphonzo Severance, Psy.D., ABN. Gary Maddox was able to tolerate all test procedures. Dr. Nicole Kindred met with the patient as needed to manage any emotional reactions to the testing procedures. Rest breaks were offered.    The battery of tests administered was selected by Dr. Nicole Kindred with consideration to the patient's current level of functioning, the nature of his symptoms, emotional and behavioral responses during the interview, level of literacy, observed level of motivation/effort, and the nature of the referral question. This battery was communicated to the psychometrist. Communication between Dr. Nicole Kindred and the psychometrist was ongoing throughout the evaluation and Dr. Nicole Kindred was immediately accessible at all times. Dr. Nicole Kindred provided supervision to the technician on the date of this service, to the extent necessary to assure the quality of all services provided.    Gary Maddox will return in approximately one week for an interactive feedback session with Dr. Nicole Kindred, at which time test performance, clinical impressions, and treatment recommendations will be reviewed in detail. The patient understands he can contact our office should he require our assistance before this time.   A total of 110 minutes of billable time were spent with Gary Maddox by the technician, including test administration and scoring time. Billing for these services is reflected in Dr. Les Pou note.   This note reflects time spent with the psychometrician and does not include test scores, clinical history, or any interpretations made by Dr. Nicole Kindred. The full report will follow in a separate note.
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
## 2020-02-15 ENCOUNTER — Encounter: Payer: Self-pay | Admitting: Counselor

## 2020-02-15 NOTE — Progress Notes (Signed)
[facility]  Patient Name: Gary Maddox MRN: [id number] Date of Birth: [birth year] Age: 69 y.o. Education: 12 years  Measurement properties of test scores: IQ, Index, and Standard Scores (SS): Mean = 100; Standard Deviation = 15 Scaled Scores (Ss): Mean = 10; Standard Deviation = 3 Z scores (Z): Mean = 0; Standard Deviation = 1 T scores (T); Mean = 50; Standard Deviation = 10  TEST SCORES:    Note: This summary of test scores accompanies the interpretive report and should not be interpreted by unqualified individuals or in isolation without reference to the report. Test scores are relative to age, gender, and educational history as available and appropriate.   Performance Validity        ACS: Raw Descriptor      Word Choice: 42 Below Expectation      The Dot Counting Test: Raw Descriptor      E-Score 12 Within Expectation      Embedded Measures: Raw Descriptor      RBANS Effort Index: 2 Within Expectation      WAIS-IV Reliable Digit Span 7 Within Expectation      WAIS-IV Reliable Digit Span Revised 10 Below Expectation      Expected Functioning        Wide Range Achievement Test (Word Reading): Standard/Scaled Score Percentile       Word Reading 69 2      Re[place]olds Intellectual Screening Test Standard/T-score Percentile      Guess What 43 25      Odd Item Out 46 34  RIST Index 92 30      Cognitive Testing        RBANS, Form : Standard/Scaled Score Percentile  Total Score 60 <1  Immediate Memory 53 <1      List Learning 2 <1      Story Memory 3 1  Visuospatial/Constructional 81 10      Figure Copy   (15) 5 5      Judgment of Line Orientation   (16) --- 26-50  Language 82 12      Picture Naming --- 51-75      Semantic Fluency 3 1  Attention 68 2      Digit Span 7 16      Coding 3 1  Delayed Memory 56 <1      List Recall   (0) --- <2      List Recognition   (16) --- <2      Story Recall   (4) 4 2      Figure Recall    (6) 5 5      Wechsler Adult Intelligence Scale - IV: Standard/Scaled Score Percentile  Working Memory Index 80 9      Digit Span 6 9          Digit Span Forward 9 37          Digit Span Backward 6 9          Digit Span Sequencing 6 9      Arithmetic 7 16  Processing Speed Index 71 3      Symbol Search 5 5      Coding 4 2      Neuropsychological Assessment Battery (Language Module): T-score Percentile      Naming   (28) 39 14      Verbal Fluency: T-score Percentile      Controlled Oral Word Association (F-A-S) 31 3  Semantic Fluency (Animals) 39 14      Trail Making Test: T-Score Percentile      Part A 43 25      Part B 45 31      Modified Wisconsin Card Sorting Test (MWCST): Standard/T-Score Percentile      Number of Categories Correct 19 <1      Number of Perseverative Errors 20 <1      Number of Total Errors 22 <1      Percent Perseverative Errors 28 2  Executive Function Composite 54 <1      Boston Diagnostic Aphasia Exam: Raw Score Scaled Score      Complex Ideational Material 11 9      Clock Drawing Raw Score Descriptor      Command 7 Mild Impairment      Rating Scales         Raw Score Descriptor  Patient Health Questionnaire - 9 3 Within Normal Limits  GAD-7 0 Within Normal Limits      Quick Dementia Rating System Raw Score Descriptor      Sum of Boxes 1.5 MCI      Total Score 3 MCI   Lakeria Starkman V. Nicole Kindred PsyD, [place] Clinical Neuropsychologist

## 2020-02-16 NOTE — Progress Notes (Signed)
[facility]  Patient Name: Gary Maddox MRN: [id number] Date of Birth: 1950-11-10 Age: 69 y.o. Education: 12 years  Clinical Impressions  Gary Maddox is a 69 y.o., right-hand dominant, married man with a history of memory problems since around 2017. He fell at that time, injuring his back, although is wife does not recall him acting altered in any way or having said that he hit his head. The patient himself does not remember the incident. He was recently evaluated by Dr. Carles Collet for memory problems and was referred for neuropsychological evaluation. He and his wife state that he gets confused with directions and has gotten lost on one or two occasions, but he typically does not drive alone. There is nothing that he previously did that he cannot do now as a result of his memory and thinking problems. He has an MRI of the brain from 2018, which revealed moderate bilateral leukoaraiosis and a calvarial defect likely related to a previous traumatic injury as a child, with some underlying encephalomalacia.   Expectations for neuropsychological test performance are tempered significantly by Gary Maddox performance on single word reading, which was extremely low. Apart from that he demonstrated cognitive problems with a subcortical flavor with marked memory encoding difficulties, diminished processing speed, and low scores on most measures of executive function. It is difficult to state with any certainty whether there is a storage component to his memory problems given low encoding, but he did generally demonstrate weak retention of information across time. His wife characterized him as functioning at no more than an MCI level, which seems reasonable given their report of his functioning on interview.   Evaluation is consistent with mild cognitive impairment with mainly subcortical features and some additional memory problems. Given Gary Maddox previous  imaging, a vascular etiology is suspected as the primary factor. 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 His prior history of head injury as a child could also be at play but is unlikely a major contributor. It is not clear that he even had a head injury after his more recent fall. There are no neuropsychological findings clearly indicating that he is unsafe to drive, although that could be further evaluated with an on the road driving evaluation. If the problem is primarily with directions then he may wish to stop driving alone and/or learn how to use a GPS app on his phone.   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 These difficulties are often encountered in the setting of cerebrovascular disease although alcohol overuse and other factors may also be contributing. Given that you are still functioning well day-to-day, mild cognitive impairment is the best diagnosis.   The major difference between mild cognitive impairment (MCI) and dementia is in severity and potential prognosis. Once someone reaches a level of severity adequate to be diagnosed with a dementia, there is usually progression over time, though this may be years. On the other hand, mild cognitive impairment, while a significant risk for dementia in future, does not always progress to dementia, and in some instances stays the same or can even revert to normal. It is important to realize that if MCI is  due to underlying Alzheimer's disease, it will most likely progress to dementia eventually. The rate of conversion to Alzheimer's  dementia from amnestic MCI is about 15% per year versus the general population risk of conversion of 2% per year.   In your case, your mild cognitive impairment is likely due mainly to cerebrovascular disease, alcohol overuse, and you also had evidence of B12 deficiency (which can be caused or exacerbated by alcohol overuse). This means that it doesn't need to get worse so long as your vascular disease does not progress further and you get better control over drinking and correct your B12 deficiency. The mainstay of treatment for vascular disease is managing risk factors, which include high blood cholesterol, high blood pressure, and high blood sugar.   The other factor I am concerned about is your alcohol consumption. You were cagey about the amount that you drink, but two drinks a day is probably doing no good and three or more drinks a day is probably doing harm. You also had a B12 deficiency on your CBC, which can be related to alcohol overuse. If you need help quitting, then help is available, such as individual counseling, alcoholics anonymous, or self-help via bibliotherapy (e.g., self-help books). You should also consider consulting with your PCP if you drink heavily, because stopping alcohol abruptly can trigger a dangerous withdrawal syndrome.   I would suggest that you work actively with your PCP to manage your blood pressure and make sure it is tightly under control. It would be ideal to do so with lifestyle changes in addition to medication, such as eating a healthier diet and exercising to the extent possible.   You said that you do not have diabetes although I would note than your last A1C, which was 2 years ago now as per your electronic health record, was in the diabetic range. Alcohol use is probably not helping that. Countless studies have found a link between diabetes (particularly midlife diabetes) and dementia, with general estimates of about 1.5 - 2 times the general population risk of  developing dementia for diabetic individuals. Individuals with poorly controlled diabetes in midlife are at particularly high risk with those having an HbA1c ? 6.5% at about a 3-fold risk and those with an HbA1c ? 7% at a 5-fold risk of developing dementia as compared with the general population. Work diligently to control your diabetes. Many of the healthy lifestyle changes that can help manage blood sugar (things like exercise and diet) have also been shown to have beneficial effects on brain health.   As for driving, most individuals with mild cognitive impairment are safe to drive whereas some are not. If there are concerns then it might be prudent to consider undergoing on the road testing, which is the gold standard way to determine safety. [employer] in [place] or [employer] [phone] may be helpful. This is typically a self-pay service and can cost around $500.00 out of pocket. If the only difficulty you are having is with directions, then I would suggest you learn to use a GPS app on your phone or by other means that can help you or that you do not drive alone.   Test Findings  Test scores are summarized in additional documentation associated with this encounter. Test scores are relative to age, gender, and educational history as available and appropriate. There were no concerns about performance validity despite a couple of anomalous scores, likely reflecting premorbid strengths and weaknesses and cognitive impairment. Marland Kitchen  General Intellectual Functioning/Achievement:  Performance on single word reading was extremely low, tempering expectations for Gary Maddox cognitive performance significantly. He had performance toward the bottom of the average range on the RIST index with relatively comparable visual and verbal subtest scores. This makes me question if lack of academic enrichment and/or learning problems are a factor in the context of his  single word reading.   Attention and Processing Efficiency: Performance on indicators of attention and working memory was reasonable with a low average score on the Working Memory Index of the WAIS-IV. Digit repetition forward was average to low average whereas his digit resequencing in ascending order and digit repetition backward were unusually low. Mental solving of arithmetical word problems without paper and pencil was low average.   Performance on timed measures was low, with a score toward the bottom of the unusually low range on the processing speed index of the WAIS-IV. He performed at an unusually low level on one timed indicator of efficient visual scanning and efficient visual matching. Performance on two different timed number-symbol coding tests was extremely low.   Language: Language findings showed intact visual object confrontation naming. By contrast, his phonemic verbal fluency was unusually low. Semantic fluency in response to the category prompt "animals" was low average.   Visuospatial Function: Performance on visuospatial and constructional indicators was likely undermined by motor control problems and lack of attention to detail, nonetheless with a low average score on the overall index. Copy of a modestly complex line drawing was sloppily executed with poor line quality and generated an unusually low score. By contrast, his judgment of angular line orientations was average.   Learning and Memory: Performance on measures of learning and memory was low, with marked encoding difficulties. There may be some storage/consolidation component given poor retention of information across time in some cases but it is difficult to state definitively.   In the verbal realm, immediate recall for information including a 10-item word list and short story were extremely low with corresponding extremely low delayed recall. Yes/no recognition for the items from the word list versus false choices  was no better and fell in the extremely low range.   In the visual realm, delayed recall for a modestly complex figure stimulus was unusually low.   Executive Functions: Performance was low on most but not all executive measures. He demonstrated an extremely low Executive Function Composite score on the Modified [employer] with low categories completed and perseverative errors scores. His generation of words in response to given letters was unusually low. Clock drawing was consistent with "mild impairment." He did better on alternating sequencing of numbers and letters of the alphabet, which was average, and also when reasoning with complex verbal information.   Rating Scale(s): Gary Maddox denied clinically significant levels of anxiety or depressive symptoms. His wife characterized him as functioning more at an MCI than a dementia range.   [name] Nicole Kindred PsyD, [facility] Clinical Neuropsychologist

## 2020-02-21 ENCOUNTER — Encounter: Payer: Self-pay | Admitting: Counselor

## 2020-02-21 ENCOUNTER — Other Ambulatory Visit: Payer: Self-pay

## 2020-02-21 ENCOUNTER — Ambulatory Visit (INDEPENDENT_AMBULATORY_CARE_PROVIDER_SITE_OTHER): Payer: Medicare Other | Admitting: Counselor

## 2020-02-21 DIAGNOSIS — F01A0 Vascular dementia, mild, without behavioral disturbance, psychotic disturbance, mood disturbance, and anxiety: Secondary | ICD-10-CM

## 2020-02-21 DIAGNOSIS — F0150 Vascular dementia without behavioral disturbance: Secondary | ICD-10-CM | POA: Diagnosis not present

## 2020-02-21 NOTE — Patient Instructions (Signed)
Your performance and presentation on neuropsychological assessment was consistent with difficulties mainly of a frontal-subcortical variety, that is with processing speed, memory, and executive functioning. These difficulties are often encountered in the setting of cerebrovascular disease although alcohol overuse and other factors may also be contributing. Given that you are still functioning well day-to-day, mild cognitive impairment is the best diagnosis.   The major difference between mild cognitive impairment (MCI) and dementia is in severity and potential prognosis. Once someone reaches a level of severity adequate to be diagnosed with a dementia, there is usually progression over time, though this may be years. On the other hand, mild cognitive impairment, while a significant risk for dementia in future, does not always progress to dementia, and in some instances stays the same or can even revert to normal.It is important to realize that if MCI is due to underlying Alzheimer's disease, it will most likely progress to dementia eventually. The rate of conversion to Alzheimer's dementiafrom amnestic MCI is about 15% per year versus the general population risk of conversion of 2% per year.  In your case, your mild cognitive impairment is likely due mainly to cerebrovascular disease, alcohol overuse, and you also had evidence of B12 deficiency (which can be caused or exacerbated by alcohol overuse). This means that it doesn't need to get worse so long as your vascular disease does not progress further and you get better control over drinking and correct your B12 deficiency. The mainstay of treatment for vascular disease is managing risk factors, which include high blood cholesterol, high blood pressure, and high blood sugar.   The other factor I am concerned about is your alcohol consumption. You were cagey about the amount that you drink, but two drinks a day is probably doing no good and three or more  drinks a day is probably doing harm. You also had a B12 deficiency on your CBC, which can be related to alcohol overuse. If you need help quitting, then help is available, such as individual counseling, alcoholics anonymous, or self-help via bibliotherapy (e.g., self-help books). You should also consider consulting with your PCP if you drink heavily, because stopping alcohol abruptly can trigger a dangerous withdrawal syndrome.   I would suggest that you work actively with your PCP to manage your blood pressure and make sure it is tightly under control. It would be ideal to do so with lifestyle changes in addition to medication, such as eating a healthier diet and exercising to the extent possible.   You said that you do not have diabetes although I would note than your last A1C, which was 2 years ago now as per your electronic health record, was in the diabetic range. Alcohol use is probably not helping that. Countless studies have found a link between diabetes (particularly midlife diabetes) and dementia, with general estimates of about 1.5 - 2 times the general population risk of developing dementia for diabetic individuals. Individuals with poorly controlled diabetes in midlife are at particularly high risk with those having an HbA1c ? 6.5% at about a 3-fold risk and those with an HbA1c ? 7% at a 5-fold risk of developing dementia as compared with the general population. Work diligently to control your diabetes. Many of the healthy lifestyle changes that can help manage blood sugar (things like exercise and diet) have also been shown to have beneficial effects on brain health.   As for driving, most individuals with mild cognitive impairment are safe to drive whereas some are not. If there  are concerns then it might be prudent to consider undergoing on the road testing, which is the gold standard way to determine safety. [employer] in [place] or [employer] [phone] may be helpful. This is typically a self-pay service and can cost around $500.00 out of pocket. If the only difficulty you are having is with directions, then I would suggest you learn to use a GPS app on your phone or by other means that can help you or that you do not drive alone.

## 2020-02-21 NOTE — Progress Notes (Signed)
NEUROPSYCHOLOGY TELEMEDICINE NOTE [facility]  Telemedicine statement:  I discussed the limitations of neuropsychological care via telemedicine and the availability of in person appointments. The patient expressed understanding and agreed to proceed. The patient was verified with two identifiers.  The visit modality was: telephonic The patient location was: home The provider location was: office  The following individuals participated: Gary Maddox  Feedback Note: I met with Gary Maddox to review the findings resulting from his neuropsychological evaluation. Since the last appointment, he has been well. Time was spent reviewing the impressions and recommendations that are detailed in the evaluation report. We discussed impression of mild cognitive impairment, likely due to vascular disease but perhaps also with contributors from alcohol use. The possibility of a separate underlying etiology cannot be entirely ruled out but is viewed as less likely at this time, given lack of specific signs and symptoms. I reinforced the importance of healthy diet, managing risk factors, and Maddox him to take his blood pressure regularly. Also discussed Dr. Doristine Devoid previously provided recommendations for B12 supplementation (he is taking Vitamin D but not B12). I took time to explain the findings and answer all the patient's questions. I Maddox Gary Maddox to contact me should he have any further questions or if further follow up is desired.   Current Medications and Medical History   Current Outpatient Medications  Medication Sig Dispense Refill  . carvedilol (COREG) 12.5 MG tablet TAKE 1 TABLET(12.5 MG) BY MOUTH TWICE DAILY WITH A MEAL (Patient taking differently: Take 12.5 mg by mouth 2 (two) times daily. ) 60 tablet 1  . gabapentin (NEURONTIN) 300 MG capsule Take 300 mg by mouth 2 (two) times daily.   0  . meloxicam (MOBIC) 15 MG tablet Take 15 mg by mouth daily.      . methocarbamol (ROBAXIN) 500 MG tablet Take 1 tablet by mouth 3 (three) times daily. Once a day    . Olmesartan-amLODIPine-HCTZ 40-10-25 MG TABS TAKE 1 TABLET BY MOUTH EVERY DAY (Patient taking differently: Take 1 tablet by mouth daily. ) 30 tablet 1  . sildenafil (REVATIO) 20 MG tablet Take 1-2 tablets (20-40 mg total) by mouth as needed. (Patient taking differently: Take 20-40 mg by mouth daily as needed (erectile dysfunction). ) 30 tablet 0  . sodium chloride (OCEAN) 0.65 % SOLN nasal spray Place 1 spray into both nostrils as needed for congestion.    Marland Kitchen tetrahydrozoline 0.05 % ophthalmic solution Place 2 drops into both eyes daily as needed (dry, red eys).     No current facility-administered medications for this visit.    Patient Active Problem List   Diagnosis Date Noted  . Radiculopathy 02/23/2018  . Memory deficit 06/25/2017  . Chronic back pain 06/25/2017  . Type 2 diabetes mellitus without complication, without long-term current use of insulin ([place]) 10/05/2016  . Elevated LDL cholesterol level 10/05/2016  . 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 Essential hypertension 12/21/2006  . GERD 12/21/2006    Mental Status and Behavioral Observations  Gary Maddox was available at the prespecified time for this telephonic appointment and was alert and generally oriented (orientation not formally assessed). Speech was normal in rate, rhythm, volume, and prosody. Self-reported mood was "good" and affect as assessed by vocal quality was mainly neutral. Thought process was logical and goal oriented and thought content was appropriate to the topics discussed. There were no safety concerns identified at today's encounter, such  as thoughts of harming self or others.   Plan  Feedback provided regarding the patient's neuropsychological evaluation. He stated that he has already decreased his alcohol use. I counseled him on the  importance of implementing preventative measures to make sure his vascular disease does not progress. Also reinforced the importance of monitoring driving and Maddox him to use GPS. Gary Maddox to contact me if any questions arise or if further follow up is desired.   Gary Simas Nicole Kindred, PsyD, ABN Clinical Neuropsychologist  Service(s) Provided at This Encounter: 29 minutes [phone]; Conjoint therapy with patient present)

## 2020-12-24 ENCOUNTER — Other Ambulatory Visit: Payer: Self-pay | Admitting: Orthopedic Surgery

## 2020-12-24 DIAGNOSIS — M545 Low back pain, unspecified: Secondary | ICD-10-CM

## 2020-12-31 ENCOUNTER — Other Ambulatory Visit: Payer: Self-pay

## 2020-12-31 ENCOUNTER — Ambulatory Visit
Admission: RE | Admit: 2020-12-31 | Discharge: 2020-12-31 | Disposition: A | Discharge status: Home / Self Care | Payer: Medicare Other | Source: Ambulatory Visit | Attending: Orthopedic Surgery | Admitting: Orthopedic Surgery

## 2020-12-31 DIAGNOSIS — M545 Low back pain, unspecified: Secondary | ICD-10-CM

## 2021-05-17 ENCOUNTER — Emergency Department (HOSPITAL_COMMUNITY)
Admission: EM | Admit: 2021-05-17 | Discharge: 2021-05-17 | Disposition: A | Discharge status: Home / Self Care | Payer: No Typology Code available for payment source | Attending: Emergency Medicine | Admitting: Emergency Medicine

## 2021-05-17 ENCOUNTER — Other Ambulatory Visit: Payer: Self-pay

## 2021-05-17 ENCOUNTER — Emergency Department (HOSPITAL_COMMUNITY): Payer: No Typology Code available for payment source

## 2021-05-17 ENCOUNTER — Encounter (HOSPITAL_COMMUNITY): Payer: Self-pay | Admitting: Emergency Medicine

## 2021-05-17 DIAGNOSIS — Z79899 Other long term (current) drug therapy: Secondary | ICD-10-CM | POA: Insufficient documentation

## 2021-05-17 DIAGNOSIS — G8929 Other chronic pain: Secondary | ICD-10-CM

## 2021-05-17 DIAGNOSIS — Y990 Civilian activity done for income or pay: Secondary | ICD-10-CM | POA: Diagnosis not present

## 2021-05-17 DIAGNOSIS — M545 Low back pain, unspecified: Secondary | ICD-10-CM | POA: Diagnosis present

## 2021-05-17 DIAGNOSIS — E119 Type 2 diabetes mellitus without complications: Secondary | ICD-10-CM | POA: Insufficient documentation

## 2021-05-17 DIAGNOSIS — I10 Essential (primary) hypertension: Secondary | ICD-10-CM | POA: Insufficient documentation

## 2021-05-17 DIAGNOSIS — Z87891 Personal history of nicotine dependence: Secondary | ICD-10-CM | POA: Insufficient documentation

## 2021-05-17 HISTORY — DX: Dorsalgia, unspecified: M54.9

## 2021-05-17 LAB — CBC WITH DIFFERENTIAL/PLATELET
Abs Immature Granulocytes: 0.03 10*3/uL (ref 0.00–0.07)
Basophils Absolute: 0.1 10*3/uL (ref 0.0–0.1)
Basophils Relative: 1 %
Eosinophils Absolute: 0.1 10*3/uL (ref 0.0–0.5)
Eosinophils Relative: 1 %
HCT: 35.6 % — ABNORMAL LOW (ref 39.0–52.0)
Hemoglobin: 12.5 g/dL — ABNORMAL LOW (ref 13.0–17.0)
Immature Granulocytes: 0 %
Lymphocytes Relative: 24 %
Lymphs Abs: 1.8 10*3/uL (ref 0.7–4.0)
MCH: 35.1 pg — ABNORMAL HIGH (ref 26.0–34.0)
MCHC: 35.1 g/dL (ref 30.0–36.0)
MCV: 100 fL (ref 80.0–100.0)
Monocytes Absolute: 0.6 10*3/uL (ref 0.1–1.0)
Monocytes Relative: 8 %
Neutro Abs: 4.9 10*3/uL (ref 1.7–7.7)
Neutrophils Relative %: 66 %
Platelets: 452 10*3/uL — ABNORMAL HIGH (ref 150–400)
RBC: 3.56 MIL/uL — ABNORMAL LOW (ref 4.22–5.81)
RDW: 12.3 % (ref 11.5–15.5)
WBC: 7.4 10*3/uL (ref 4.0–10.5)
nRBC: 0 % (ref 0.0–0.2)

## 2021-05-17 LAB — COMPREHENSIVE METABOLIC PANEL
ALT: 49 U/L — ABNORMAL HIGH (ref 0–44)
AST: 77 U/L — ABNORMAL HIGH (ref 15–41)
Albumin: 2.8 g/dL — ABNORMAL LOW (ref 3.5–5.0)
Alkaline Phosphatase: 281 U/L — ABNORMAL HIGH (ref 38–126)
Anion gap: 11 (ref 5–15)
BUN: 13 mg/dL (ref 8–23)
CO2: 28 mmol/L (ref 22–32)
Calcium: 8.9 mg/dL (ref 8.9–10.3)
Chloride: 88 mmol/L — ABNORMAL LOW (ref 98–111)
Creatinine, Ser: 0.97 mg/dL (ref 0.61–1.24)
GFR, Estimated: >60 mL/min (ref >60)
Glucose, Bld: 106 mg/dL — ABNORMAL HIGH (ref 70–99)
Potassium: 3.5 mmol/L (ref 3.5–5.1)
Sodium: 127 mmol/L — ABNORMAL LOW (ref 135–145)
Total Bilirubin: 1.2 mg/dL (ref 0.3–1.2)
Total Protein: 7.7 g/dL (ref 6.5–8.1)

## 2021-05-17 MED ORDER — ONDANSETRON HCL 4 MG/2ML IJ SOLN
4.0000 mg | Freq: Once | INTRAMUSCULAR | Status: AC
  Administered 2021-05-17: 4 mg via INTRAVENOUS
  Filled 2021-05-17: qty 2

## 2021-05-17 MED ORDER — LORAZEPAM 2 MG/ML IJ SOLN
1.0000 mg | Freq: Once | INTRAMUSCULAR | Status: AC
  Administered 2021-05-17: 1 mg via INTRAVENOUS
  Filled 2021-05-17: qty 1

## 2021-05-17 MED ORDER — MORPHINE SULFATE (PF) 4 MG/ML IV SOLN
4.0000 mg | Freq: Once | INTRAVENOUS | Status: AC
  Administered 2021-05-17: 4 mg via INTRAVENOUS
  Filled 2021-05-17: qty 1

## 2021-05-17 NOTE — ED Provider Notes (Signed)
Emergency Medicine Provider Triage Evaluation Note  Gary Maddox , a 70 y.o. male  was evaluated in triage.  Pt complains of worsening low back pain.  Has ongoing issues with degenerative disc disease but over the past month pain has gotten progressively worse it is now radiating down both legs and with this he reports he is having weakness and numbness in his legs.  Wife reports over the past 2 weeks in particular he has had a lot of difficulty getting around, can barely walk around the house with use of a cane and has had multiple falls.  She has had difficulty getting him to the bathroom quickly enough.  He has been followed by Dr. Lynann Bologna at [facility] for this, has been doing physical therapy and taking medications with no improvement.  Review of Systems  Positive: Back pain, numbness, weakness, falls Negative: Fevers, loss of bowel or bladder control  Physical Exam  BP 122/75 (BP Location: Left Arm)    Pulse (!) 56    Temp 98.6 F (37 C) (Oral)    Resp 17    SpO2 98%  Gen:   Awake, no distress   Resp:  Normal effort  MSK:   Mild weakness in bilateral lower extremities 4+/5, sensation intact, pain across the low back worse with any movement Other:    Medical Decision Making  Medically screening exam initiated at 11:36 AM.  Appropriate orders placed.  Mithran Strike was informed that the remainder of the evaluation will be completed by another provider, this initial triage assessment does not replace that evaluation, and the importance of remaining in the ED until their evaluation is complete.  Back pain with worsening bilateral lower extremity weakness now with multiple falls and difficulty ambulating at home.  Prior surgery.  Will get MRI   Janet Berlin 05/17/21 1147    Dorie Rank, MD 05/17/21 [phone]

## 2021-05-17 NOTE — Discharge Instructions (Addendum)
Please continue to take your prescribed pain medications as needed.  Below is the contact information for [facility].  Please give them a call as soon as possible to schedule an appointment for reevaluation as well as to review your MRI.  If you develop worsening pain, numbness, weakness, bowel or bladder incontinence, please come back to the emergency department immediately.  It was a pleasure to meet you both.

## 2021-05-17 NOTE — ED Notes (Signed)
Requested pain meds and anxiety meds from [facility].

## 2021-05-17 NOTE — ED Provider Notes (Signed)
[facility] EMERGENCY DEPARTMENT Provider Note   CSN: [id number] Arrival date & time: 05/17/21  1037     History Chief Complaint  Patient presents with   Back Pain    Gary Maddox is a 70 y.o. male.  70 year old male presents with his wife for evaluation of acute on chronic low back pain which is progressively and significantly worsened over the past month to the point patient is not able to ambulate.  Patient also endorses radiculopathy down both legs.  Reports significant worsening of his pain.  Patient reports he does have history of chronic low back pain and has been following with orthopedics since 2019.  Reports he had an MRI done few months ago.  Wife is at bedside who reports up until a month ago patient was able to ambulate without difficulty.  He now endorses difficulty controlling his urine, pins and needle sensation in bottom of both of his feet, difficulty ambulating, and worsening back pain.  He denies any fever, chills, or personal history of malignancy.  He does have history of lumbar spine surgery.   The history is provided by the patient. No language interpreter was used.      Past Medical History:  Diagnosis Date   Alcohol abuse    Back pain    Diabetes mellitus without complication ([place])    Elevated LDL cholesterol level 10/05/2016   Essential hypertension 12/21/2006   Qualifier: Diagnosis of  By: Leward Quan MD, Pamala Hurry     GERD 12/21/2006   Qualifier: Diagnosis of  By: Leward Quan MD, Pamala Hurry     HEPATITIS C, CHRONIC VIRAL, W/O HEPATIC COMA 12/24/2006   Qualifier: Diagnosis of  By: Leward Quan MD, [facility]      Patient Active Problem List   Diagnosis Date Noted   Radiculopathy 02/23/2018   Memory deficit 06/25/2017   Chronic back pain 06/25/2017   Type 2 diabetes mellitus without complication, without long-term current use of insulin ([place]) 10/05/2016   Elevated LDL cholesterol level 10/05/2016   HEPATITIS C, CHRONIC VIRAL, W/O HEPATIC  COMA 12/24/2006   ABUSE, ALCOHOL, IN REMISSION 12/24/2006   Essential hypertension 12/21/2006   GERD 12/21/2006    Past Surgical History:  Procedure Laterality Date   ABDOMINAL EXPOSURE N/A 02/23/2018   Procedure: ABDOMINAL EXPOSURE;  Surgeon: Rosetta Posner, MD;  Location: [facility] OR;  Service: Vascular;  Laterality: N/A;   ANTERIOR LUMBAR FUSION N/A 02/23/2018   Procedure: ANTERIOR LUMBAR INTERBODY FUSION LUMBAR 5 - SACRUM 1;  Surgeon: Phylliss Bob, MD;  Location: [facility];  Service: Orthopedics;  Laterality: N/A;   BACK SURGERY     FOOT SURGERY     KNEE SURGERY         Family History  Problem Relation Age of Onset   Hypertension Mother    Hypertension Father     Social History   Tobacco Use   Smoking status: Former    Types: Cigarettes   Smokeless tobacco: Never   Tobacco comments:    quit a long time ago about 25 years ago  Vaping Use   Vaping Use: Never used  Substance Use Topics   Alcohol use: Yes    Comment: 1/2 pint per week   Drug use: No    Home Medications Prior to Admission medications   Medication Sig Start Date End Date Taking? Authorizing Provider  carvedilol (COREG) 12.5 MG tablet TAKE 1 TABLET(12.5 MG) BY MOUTH TWICE DAILY WITH A MEAL Patient taking differently: Take 12.5 mg by mouth 2 (  two) times daily.  01/04/18   Weber, Damaris Hippo, PA-C  gabapentin (NEURONTIN) 300 MG capsule Take 300 mg by mouth 2 (two) times daily.  11/12/17   [provider]  meloxicam (MOBIC) 15 MG tablet Take 15 mg by mouth daily.    [provider]  methocarbamol (ROBAXIN) 500 MG tablet Take 1 tablet by mouth 3 (three) times daily. Once a day 04/20/19   [provider]  Olmesartan-amLODIPine-HCTZ 40-10-25 MG TABS TAKE 1 TABLET BY MOUTH EVERY DAY Patient taking differently: Take 1 tablet by mouth daily.  01/04/18   Weber, Damaris Hippo, PA-C  sildenafil (REVATIO) 20 MG tablet Take 1-2 tablets (20-40 mg total) by mouth as needed. Patient taking differently: Take 20-40  mg by mouth daily as needed (erectile dysfunction).  01/19/18   Weber, Damaris Hippo, PA-C  sodium chloride (OCEAN) 0.65 % SOLN nasal spray Place 1 spray into both nostrils as needed for congestion.    [provider]  tetrahydrozoline 0.05 % ophthalmic solution Place 2 drops into both eyes daily as needed (dry, red eys).    [provider]    Allergies    Patient has no known allergies.  Review of Systems   Review of Systems  Constitutional:  Negative for chills and fever.  Respiratory:  Negative for shortness of breath.   Gastrointestinal:  Negative for abdominal pain, nausea and vomiting.  Musculoskeletal:  Positive for back pain and gait problem. Negative for neck pain and neck stiffness.  Neurological:  Positive for weakness. Negative for light-headedness and headaches.  All other systems reviewed and are negative.  Physical Exam Updated Vital Signs BP 122/75 (BP Location: Left Arm)    Pulse (!) 56    Temp 98.6 F (37 C) (Oral)    Resp 17    SpO2 98%   Physical Exam Vitals and nursing note reviewed.  Constitutional:      General: He is not in acute distress.    Appearance: Normal appearance. He is not ill-appearing.  HENT:     Head: Normocephalic and atraumatic.     Nose: Nose normal.  Eyes:     General: No scleral icterus.    Extraocular Movements: Extraocular movements intact.     Conjunctiva/sclera: Conjunctivae normal.  Cardiovascular:     Rate and Rhythm: Normal rate and regular rhythm.     Pulses: Normal pulses.     Heart sounds: Normal heart sounds.  Pulmonary:     Effort: Pulmonary effort is normal. No respiratory distress.     Breath sounds: Normal breath sounds. No wheezing or rales.  Abdominal:     General: There is no distension.     Tenderness: There is no abdominal tenderness.  Musculoskeletal:        General: Normal range of motion.     Cervical back: Normal range of motion.     Comments: Cervical and thoracic spine without tenderness to  palpation.  Spine without deformity or step-offs.  Lumbar spine with tenderness to palpation as well as lumbar paraspinal muscles.  Bilateral lower extremities with 4/5 strength on knee extension, knee flexion, dorsiflexion, plantar flexion.  Preserved range of motion including hip flexion, knee extension, knee flexion, dorsiflexion and plantar flexion.  2+ DP pulse present bilaterally.  Patient requires significant assistance to get up to sitting position and to stand.  Patient unable to ambulate without assistance, and has shuffling gait.  Skin:    General: Skin is warm and dry.  Neurological:  General: No focal deficit present.     Mental Status: He is alert. Mental status is at baseline.    ED Results / Procedures / Treatments   Labs (all labs ordered are listed, but only abnormal results are displayed) Labs Reviewed  COMPREHENSIVE METABOLIC PANEL - Abnormal; Notable for the following components:      Result Value   Sodium 127 (*)    Chloride 88 (*)    Glucose, Bld 106 (*)    Albumin 2.8 (*)    AST 77 (*)    ALT 49 (*)    Alkaline Phosphatase 281 (*)    All other components within normal limits  CBC WITH DIFFERENTIAL/PLATELET - Abnormal; Notable for the following components:   RBC 3.56 (*)    Hemoglobin 12.5 (*)    HCT 35.6 (*)    MCH 35.1 (*)    Platelets 452 (*)    All other components within normal limits    EKG None  Radiology No results found.  Procedures Procedures   Medications Ordered in ED Medications  morphine 4 MG/ML injection 4 mg (has no administration in time range)  ondansetron (ZOFRAN) injection 4 mg (has no administration in time range)  LORazepam (ATIVAN) injection 1 mg (has no administration in time range)    ED Course  I have reviewed the triage vital signs and the nursing notes.  Pertinent labs & imaging results that were available during my care of the patient were reviewed by me and considered in my medical decision making (see chart for  details).    MDM Rules/Calculators/A&P                         69 year old male with history of chronic low back pain follows with [facility] presents today for evaluation of worsening low back pain, difficulty ambulating, bilateral lower extremity radiculopathy, difficulty with controlling his urine recently.  Patient recently had MRI of his lumbar spine done August 2022 without acute concerns.  Patient does have history of lumbar L5-S1 fusion with Dr. Lynann Bologna.  Given patient's exam with worsening back pain, difficulty ambulating MRI lumbar spine ordered.  Consult placed to  [facility].   Case discussed with Melissa Montane, PA for [facility].  He agrees with awaiting MRI to rule out cauda equina.  He does also state that the on-call orthopedic surgeon does not perform spine surgery and if there is something acute requiring attention that on-call neurosurgery will need to be consulted.  At the end of my shift we are still awaiting MRI.  Patient signed out to oncoming provider for follow-up of MRI and appropriate disposition.      Final Clinical Impression(s) / ED Diagnoses Final diagnoses:  None    Rx / DC Orders ED Discharge Orders     None        Evlyn Courier, PA-C 05/17/21 1606    Charlesetta Shanks, MD 05/21/21 [phone]

## 2021-05-17 NOTE — ED Provider Notes (Signed)
Patient is a 70 year old male whose care is transferred to me at shift change from [facility].  His HPI is below:  Gary Maddox is a 70 y.o. male.   70 year old male presents with his wife for evaluation of acute on chronic low back pain which is progressively and significantly worsened over the past month to the point patient is not able to ambulate.  Patient also endorses radiculopathy down both legs.  Reports significant worsening of his pain.  Patient reports he does have history of chronic low back pain and has been following with orthopedics since 2019.  Reports he had an MRI done few months ago.  Wife is at bedside who reports up until a month ago patient was able to ambulate without difficulty.  He now endorses difficulty controlling his urine, pins and needle sensation in bottom of both of his feet, difficulty ambulating, and worsening back pain.  He denies any fever, chills, or personal history of malignancy.  He does have history of lumbar spine surgery.    The history is provided by the patient. No language interpreter was used.   Physical Exam  BP (!) 145/91 (BP Location: Left Arm)    Pulse (!) 54    Temp 98.6 F (37 C) (Oral)    Resp 18    SpO2 100%   Physical Exam Vitals and nursing note reviewed.  Constitutional:      General: He is not in acute distress.    Appearance: He is well-developed.  HENT:     Head: Normocephalic and atraumatic.     Right Ear: External ear normal.     Left Ear: External ear normal.  Eyes:     General: No scleral icterus.       Right eye: No discharge.        Left eye: No discharge.     Conjunctiva/sclera: Conjunctivae normal.  Neck:     Trachea: No tracheal deviation.  Cardiovascular:     Rate and Rhythm: Normal rate.  Pulmonary:     Effort: Pulmonary effort is normal. No respiratory distress.     Breath sounds: No stridor.  Abdominal:     General: There is no distension.  Musculoskeletal:        General: Tenderness present. No swelling  or deformity.     Cervical back: Neck supple.     Comments: Mild tenderness noted diffusely in the lumbar region across the midline lumbar spine as well as the bilateral lumbar paraspinal musculature.  No step-offs, crepitus, or deformities.  Skin:    General: Skin is warm and dry.     Findings: No rash.  Neurological:     Mental Status: He is alert.     Cranial Nerves: Cranial nerve deficit: no gross deficits.     Comments: Strength is 4/5 in the bilateral lower extremities.  Distal sensation intact.  2+ DP pulses noted bilaterally.  Patient is able to stand and ambulate with a shuffled gait with the assistance of a cane.   ED Course/Procedures     Procedures  MDM  Patient is a 70 year old male whose care was transferred to me at shift change from prior PA-C.  Please see his note below for additional information.  In summary, patient is a 70 year old male with a history of chronic low back pain followed by [facility].  Reports a previous lumbar L5-S1 fusion with Dr. Lynann Bologna.  Patient denies any acute falls or trauma.  States that he has had worsening pain  and due to this makes him feel "off balance".  Denies any bowel or bladder incontinence.  Denies any urinary retention.  Denies any numbness in the lower extremities.  At the time of shift change MRI was ordered and was pending.  This has since resulted with findings as noted below:  IMPRESSION:  1. Anterior and posterior fusion at L5-S1 without residual or  recurrent stenosis.  2. 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  3. Progressive adjacent level disease at L3-4 with mild left  subarticular and moderate left foraminal stenosis.  4. Moderate left subarticular and moderate to severe left foraminal  stenosis at L2-3 has progressed.   I reassessed the patient.  Patient does have mild tenderness diffusely in the lumbar  region.  No step-offs, crepitus, or deformities.  Strength is 4/5 and symmetric in the bilateral lower extremities.  Distal sensation intact.  Palpable pedal pulses.  Patient is able to stand and ambulate with the assistance of a cane.  Symptoms not appear consistent with cauda equina at this time.  Feel that patient is stable for discharge at this time and he and his wife are agreeable.  He states he has prescription pain medications at home.  Urged him to continue to take these as needed.  We will give him a referral to neurosurgery and urged him to follow-up as soon as possible and schedule an appointment for reevaluation.  We discussed signs and symptoms associated with cauda equina and patient understands to return to the emergency department if any of these should develop.  His questions were answered and he was amicable at the time of discharge.      Rayna Sexton, PA-C 05/17/21 1906    Gary Gambler, MD 05/18/21 1510

## 2021-05-17 NOTE — ED Triage Notes (Signed)
Pt reports chronic lower back pain that has been worse x 1 month with bilateral leg weakness.  States he is unable to walk and unable to take a shower.

## 2021-05-30 ENCOUNTER — Encounter (HOSPITAL_COMMUNITY): Payer: Self-pay | Admitting: *Deleted

## 2021-05-30 ENCOUNTER — Other Ambulatory Visit: Payer: Self-pay

## 2021-05-30 ENCOUNTER — Emergency Department (HOSPITAL_COMMUNITY)
Admission: EM | Admit: 2021-05-30 | Discharge: 2021-05-30 | Discharge status: Against Medical Advice | Payer: Medicare Other | Source: Home / Self Care

## 2021-05-30 DIAGNOSIS — E871 Hypo-osmolality and hyponatremia: Secondary | ICD-10-CM | POA: Diagnosis not present

## 2021-05-30 LAB — CBC WITH DIFFERENTIAL/PLATELET
Abs Immature Granulocytes: 0.02 10*3/uL (ref 0.00–0.07)
Basophils Absolute: 0 10*3/uL (ref 0.0–0.1)
Basophils Relative: 1 %
Eosinophils Absolute: 0.1 10*3/uL (ref 0.0–0.5)
Eosinophils Relative: 1 %
HCT: 33.6 % — ABNORMAL LOW (ref 39.0–52.0)
Hemoglobin: 12.2 g/dL — ABNORMAL LOW (ref 13.0–17.0)
Immature Granulocytes: 0 %
Lymphocytes Relative: 26 %
Lymphs Abs: 2 10*3/uL (ref 0.7–4.0)
MCH: 35.8 pg — ABNORMAL HIGH (ref 26.0–34.0)
MCHC: 36.3 g/dL — ABNORMAL HIGH (ref 30.0–36.0)
MCV: 98.5 fL (ref 80.0–100.0)
Monocytes Absolute: 0.8 10*3/uL (ref 0.1–1.0)
Monocytes Relative: 10 %
Neutro Abs: 4.6 10*3/uL (ref 1.7–7.7)
Neutrophils Relative %: 62 %
Platelets: 455 10*3/uL — ABNORMAL HIGH (ref 150–400)
RBC: 3.41 MIL/uL — ABNORMAL LOW (ref 4.22–5.81)
RDW: 12.1 % (ref 11.5–15.5)
WBC: 7.6 10*3/uL (ref 4.0–10.5)
nRBC: 0 % (ref 0.0–0.2)

## 2021-05-30 LAB — MAGNESIUM: Magnesium: 1.3 mg/dL — ABNORMAL LOW (ref 1.7–2.4)

## 2021-05-30 LAB — COMPREHENSIVE METABOLIC PANEL
ALT: 48 U/L — ABNORMAL HIGH (ref 0–44)
AST: 83 U/L — ABNORMAL HIGH (ref 15–41)
Albumin: 2.6 g/dL — ABNORMAL LOW (ref 3.5–5.0)
Alkaline Phosphatase: 216 U/L — ABNORMAL HIGH (ref 38–126)
Anion gap: 9 (ref 5–15)
BUN: 12 mg/dL (ref 8–23)
CO2: 29 mmol/L (ref 22–32)
Calcium: 8.9 mg/dL (ref 8.9–10.3)
Chloride: 85 mmol/L — ABNORMAL LOW (ref 98–111)
Creatinine, Ser: 0.98 mg/dL (ref 0.61–1.24)
GFR, Estimated: >60 mL/min (ref >60)
Glucose, Bld: 147 mg/dL — ABNORMAL HIGH (ref 70–99)
Potassium: 3.8 mmol/L (ref 3.5–5.1)
Sodium: 123 mmol/L — ABNORMAL LOW (ref 135–145)
Total Bilirubin: 1.1 mg/dL (ref 0.3–1.2)
Total Protein: 6.3 g/dL — ABNORMAL LOW (ref 6.5–8.1)

## 2021-05-30 NOTE — ED Notes (Signed)
Called for patient to retake V/S, no answer.

## 2021-05-30 NOTE — ED Notes (Addendum)
Called pt no answer and do not see pt in lobby

## 2021-05-30 NOTE — ED Triage Notes (Signed)
Pt reports going to pcp yesterday and had blood work done, sent here due to low sodium level. Reports recent fatigue, not eating and drinking per norm. No acute distress is noted at triage.

## 2021-05-30 NOTE — ED Provider Triage Note (Signed)
Emergency Medicine Provider Triage Evaluation Note  Gary Maddox , a 71 y.o. male  was evaluated in triage.  Pt complains of abnormal blood work from PCP office.  Patient was called today and notified that his sodium was 122 and needed to present to the emergency room.  Wife reports patient has had lack of appetite over the past month and also takes hydrochlorothiazide for hypertension.  Review of Systems  Positive: Weakness, lack of appetite Negative: Fever, chills, cough, abdominal pain, confusion  Physical Exam  BP 100/73 (BP Location: Right Arm)    Pulse (!) 54    Temp 97.8 F (36.6 C) (Oral)    Resp 18    SpO2 97%  Gen:   Awake, no distress   Resp:  Normal effort  MSK:   Moves extremities without difficulty  Other:    Medical Decision Making  Medically screening exam initiated at 9:48 AM.  Appropriate orders placed.  Gary Maddox was informed that the remainder of the evaluation will be completed by another provider, this initial triage assessment does not replace that evaluation, and the importance of remaining in the ED until their evaluation is complete.     Evlyn Courier, PA-C 05/30/21 [phone]

## 2021-05-30 NOTE — ED Notes (Signed)
Called patient for 3rd time to retake V/S, no answer.

## 2021-05-31 ENCOUNTER — Encounter (HOSPITAL_COMMUNITY): Payer: Self-pay | Admitting: Emergency Medicine

## 2021-05-31 ENCOUNTER — Inpatient Hospital Stay (HOSPITAL_COMMUNITY)
Admission: EM | Admit: 2021-05-31 | Discharge: 2021-06-02 | DRG: 641 | Disposition: A | Discharge status: Home Health | Payer: Medicare Other | Attending: Internal Medicine | Admitting: Internal Medicine

## 2021-05-31 DIAGNOSIS — G8929 Other chronic pain: Secondary | ICD-10-CM | POA: Diagnosis present

## 2021-05-31 DIAGNOSIS — K5900 Constipation, unspecified: Secondary | ICD-10-CM | POA: Diagnosis present

## 2021-05-31 DIAGNOSIS — I10 Essential (primary) hypertension: Secondary | ICD-10-CM | POA: Diagnosis present

## 2021-05-31 DIAGNOSIS — B182 Chronic viral hepatitis C: Secondary | ICD-10-CM | POA: Diagnosis present

## 2021-05-31 DIAGNOSIS — E7800 Pure hypercholesterolemia, unspecified: Secondary | ICD-10-CM | POA: Diagnosis present

## 2021-05-31 DIAGNOSIS — R269 Unspecified abnormalities of gait and mobility: Secondary | ICD-10-CM | POA: Diagnosis present

## 2021-05-31 DIAGNOSIS — Z8249 Family history of ischemic heart disease and other diseases of the circulatory system: Secondary | ICD-10-CM

## 2021-05-31 DIAGNOSIS — I959 Hypotension, unspecified: Secondary | ICD-10-CM | POA: Diagnosis present

## 2021-05-31 DIAGNOSIS — R5381 Other malaise: Secondary | ICD-10-CM

## 2021-05-31 DIAGNOSIS — Z20822 Contact with and (suspected) exposure to covid-19: Secondary | ICD-10-CM | POA: Diagnosis present

## 2021-05-31 DIAGNOSIS — K219 Gastro-esophageal reflux disease without esophagitis: Secondary | ICD-10-CM | POA: Diagnosis present

## 2021-05-31 DIAGNOSIS — E871 Hypo-osmolality and hyponatremia: Principal | ICD-10-CM | POA: Diagnosis present

## 2021-05-31 DIAGNOSIS — E119 Type 2 diabetes mellitus without complications: Secondary | ICD-10-CM

## 2021-05-31 DIAGNOSIS — M545 Low back pain, unspecified: Secondary | ICD-10-CM | POA: Diagnosis present

## 2021-05-31 DIAGNOSIS — Z6821 Body mass index (BMI) 21.0-21.9, adult: Secondary | ICD-10-CM

## 2021-05-31 DIAGNOSIS — Z87891 Personal history of nicotine dependence: Secondary | ICD-10-CM

## 2021-05-31 DIAGNOSIS — R7401 Elevation of levels of liver transaminase levels: Secondary | ICD-10-CM | POA: Diagnosis present

## 2021-05-31 DIAGNOSIS — Z791 Long term (current) use of non-steroidal anti-inflammatories (NSAID): Secondary | ICD-10-CM

## 2021-05-31 DIAGNOSIS — E785 Hyperlipidemia, unspecified: Secondary | ICD-10-CM | POA: Diagnosis present

## 2021-05-31 DIAGNOSIS — Z79899 Other long term (current) drug therapy: Secondary | ICD-10-CM

## 2021-05-31 DIAGNOSIS — Z981 Arthrodesis status: Secondary | ICD-10-CM

## 2021-05-31 DIAGNOSIS — E440 Moderate protein-calorie malnutrition: Secondary | ICD-10-CM | POA: Diagnosis present

## 2021-05-31 LAB — COMPREHENSIVE METABOLIC PANEL
ALT: 49 U/L — ABNORMAL HIGH (ref 0–44)
AST: 87 U/L — ABNORMAL HIGH (ref 15–41)
Albumin: 2.9 g/dL — ABNORMAL LOW (ref 3.5–5.0)
Alkaline Phosphatase: 217 U/L — ABNORMAL HIGH (ref 38–126)
Anion gap: 10 (ref 5–15)
BUN: 14 mg/dL (ref 8–23)
CO2: 26 mmol/L (ref 22–32)
Calcium: 8.7 mg/dL — ABNORMAL LOW (ref 8.9–10.3)
Chloride: 84 mmol/L — ABNORMAL LOW (ref 98–111)
Creatinine, Ser: 0.78 mg/dL (ref 0.61–1.24)
GFR, Estimated: >60 mL/min (ref >60)
Glucose, Bld: 119 mg/dL — ABNORMAL HIGH (ref 70–99)
Potassium: 3.8 mmol/L (ref 3.5–5.1)
Sodium: 120 mmol/L — ABNORMAL LOW (ref 135–145)
Total Bilirubin: 1.2 mg/dL (ref 0.3–1.2)
Total Protein: 6.9 g/dL (ref 6.5–8.1)

## 2021-05-31 LAB — URINALYSIS, ROUTINE W REFLEX MICROSCOPIC
Bacteria, UA: NONE SEEN
Bilirubin Urine: NEGATIVE
Glucose, UA: NEGATIVE mg/dL
Hgb urine dipstick: NEGATIVE
Ketones, ur: NEGATIVE mg/dL
Leukocytes,Ua: NEGATIVE
Nitrite: NEGATIVE
Protein, ur: 30 mg/dL — AB
Specific Gravity, Urine: 1.013 (ref 1.005–1.030)
pH: 6 (ref 5.0–8.0)

## 2021-05-31 LAB — CBC
HCT: 36.3 % — ABNORMAL LOW (ref 39.0–52.0)
Hemoglobin: 13.2 g/dL (ref 13.0–17.0)
MCH: 35.7 pg — ABNORMAL HIGH (ref 26.0–34.0)
MCHC: 36.4 g/dL — ABNORMAL HIGH (ref 30.0–36.0)
MCV: 98.1 fL (ref 80.0–100.0)
Platelets: 428 10*3/uL — ABNORMAL HIGH (ref 150–400)
RBC: 3.7 MIL/uL — ABNORMAL LOW (ref 4.22–5.81)
RDW: 12.1 % (ref 11.5–15.5)
WBC: 7.8 10*3/uL (ref 4.0–10.5)
nRBC: 0 % (ref 0.0–0.2)

## 2021-05-31 LAB — SODIUM: Sodium: 121 mmol/L — ABNORMAL LOW (ref 135–145)

## 2021-05-31 LAB — CREATININE, URINE, RANDOM: Creatinine, Urine: 104.2 mg/dL

## 2021-05-31 LAB — RESP PANEL BY RT-PCR (FLU A&B, COVID) ARPGX2
Influenza A by PCR: NEGATIVE
Influenza B by PCR: NEGATIVE
SARS Coronavirus 2 by RT PCR: NEGATIVE

## 2021-05-31 LAB — MAGNESIUM: Magnesium: 1.4 mg/dL — ABNORMAL LOW (ref 1.7–2.4)

## 2021-05-31 LAB — PROTIME-INR
INR: 1.1 (ref 0.8–1.2)
Prothrombin Time: 13.9 seconds (ref 11.4–15.2)

## 2021-05-31 LAB — HEMOGLOBIN A1C
Hgb A1c MFr Bld: 5.9 % — ABNORMAL HIGH (ref 4.8–5.6)
Mean Plasma Glucose: 122.63 mg/dL

## 2021-05-31 LAB — GLUCOSE, CAPILLARY: Glucose-Capillary: 113 mg/dL — ABNORMAL HIGH (ref 70–99)

## 2021-05-31 LAB — PHOSPHORUS: Phosphorus: 3.5 mg/dL (ref 2.5–4.6)

## 2021-05-31 LAB — SODIUM, URINE, RANDOM: Sodium, Ur: 27 mmol/L

## 2021-05-31 MED ORDER — ENOXAPARIN SODIUM 40 MG/0.4ML IJ SOSY
40.0000 mg | PREFILLED_SYRINGE | INTRAMUSCULAR | Status: DC
  Administered 2021-05-31 – 2021-06-01 (×2): 40 mg via SUBCUTANEOUS
  Filled 2021-05-31 (×2): qty 0.4

## 2021-05-31 MED ORDER — ONDANSETRON HCL 4 MG PO TABS: 4.0000 mg | ORAL_TABLET | Freq: Four times a day (QID) | ORAL | Status: DC | PRN

## 2021-05-31 MED ORDER — MELOXICAM 7.5 MG PO TABS
7.5000 mg | ORAL_TABLET | Freq: Every day | ORAL | Status: DC
  Administered 2021-05-31 – 2021-06-02 (×3): 7.5 mg via ORAL
  Filled 2021-05-31 (×3): qty 1

## 2021-05-31 MED ORDER — SODIUM CHLORIDE 0.9 % IV BOLUS
1000.0000 mL | Freq: Once | INTRAVENOUS | Status: AC
Start: 2021-05-31 — End: 2021-05-31
  Administered 2021-05-31: 1000 mL via INTRAVENOUS

## 2021-05-31 MED ORDER — GABAPENTIN 300 MG PO CAPS
300.0000 mg | ORAL_CAPSULE | Freq: Two times a day (BID) | ORAL | Status: DC
  Administered 2021-05-31 – 2021-06-02 (×4): 300 mg via ORAL
  Filled 2021-05-31 (×4): qty 1

## 2021-05-31 MED ORDER — SODIUM CHLORIDE 0.9 % IV BOLUS: 500.0000 mL | Freq: Once | INTRAVENOUS | Status: DC

## 2021-05-31 MED ORDER — MAGNESIUM SULFATE 2 GM/50ML IV SOLN
2.0000 g | Freq: Once | INTRAVENOUS | Status: AC
  Administered 2021-05-31: 2 g via INTRAVENOUS
  Filled 2021-05-31: qty 50

## 2021-05-31 MED ORDER — ONDANSETRON HCL 4 MG/2ML IJ SOLN: 4.0000 mg | Freq: Four times a day (QID) | INTRAMUSCULAR | Status: DC | PRN

## 2021-05-31 MED ORDER — SODIUM CHLORIDE 0.9 % IV SOLN: INTRAVENOUS | Status: DC

## 2021-05-31 MED ORDER — METHOCARBAMOL 500 MG PO TABS
500.0000 mg | ORAL_TABLET | Freq: Two times a day (BID) | ORAL | Status: DC
  Administered 2021-05-31 – 2021-06-02 (×4): 500 mg via ORAL
  Filled 2021-05-31 (×4): qty 1

## 2021-05-31 MED ORDER — TRAMADOL HCL 50 MG PO TABS: 50.0000 mg | ORAL_TABLET | Freq: Four times a day (QID) | ORAL | Status: DC | PRN

## 2021-05-31 MED ORDER — SODIUM CHLORIDE 0.9 % IV BOLUS: 1000.0000 mL | Freq: Once | INTRAVENOUS | Status: DC

## 2021-05-31 NOTE — ED Triage Notes (Signed)
PT sent by PCP for further evaluation of hyponatremia. Seen at PCP Thursday, labs at [facility] drawn yesterday. Sodium resulted 123.

## 2021-05-31 NOTE — ED Provider Triage Note (Signed)
Emergency Medicine Provider Triage Evaluation Note  Gary Maddox , a 71 y.o. male  was evaluated in triage.  Patient presents with his wife for evaluation of low sodium level.  Patient had blood work done at PCPs office yesterday was notified last night to come to the emergency room.  Wife reports patient over the past 3 weeks that significantly decreased p.o. intake including hydration because of patient's limited mobility 2/2 chronic low back pain.  He denies recent illness, fever, chills, abdominal pain, vomiting, or diarrhea.  Reports he was constipated and took prune juice and had bowel movement today.   Review of Systems  Positive: As above Negative: As above  Physical Exam  BP (!) 95/54    Pulse 64    Temp 97.9 F (36.6 C)    Resp 16    SpO2 100%  Gen:   Awake, no distress   Resp:  Normal effort  MSK:   Moves extremities without difficulty  Other:    Medical Decision Making  Medically screening exam initiated at 10:03 AM.  Appropriate orders placed.  Gary Maddox was informed that the remainder of the evaluation will be completed by another provider, this initial triage assessment does not replace that evaluation, and the importance of remaining in the ED until their evaluation is complete.  Discussed with nursing staff to prioritize patient getting a room.   Gary Courier, PA-C 05/31/21 1006

## 2021-05-31 NOTE — ED Notes (Addendum)
Reminded patient of need for urine. Patient unable to provide sample. This nurse notified MD .   MD alexa demaio declined I&O cath to obtain urine sample.

## 2021-05-31 NOTE — ED Notes (Signed)
Patient refuses zofran at this time.

## 2021-05-31 NOTE — ED Notes (Signed)
Patients 15:00 meds past due. Leveda Anna RN message pharmacy to ask to verify meds due to not being verified. Pharmacy messaged back stating Med Rec in progress. Medications will be verified once Med Rec is completed per Pharmacist. JRP, RN

## 2021-05-31 NOTE — ED Provider Notes (Signed)
[name]   CSN: [id number] Arrival date & time: 05/31/21  0934     History Past Medical History:  Diagnosis Date   Alcohol abuse    Back pain    Diabetes mellitus without complication ([place])    Elevated LDL cholesterol level 10/05/2016   Essential hypertension 12/21/2006   Qualifier: Diagnosis of  By: Leward Quan MD, Pamala Hurry     GERD 12/21/2006   Qualifier: Diagnosis of  By: Leward Quan MD, Pamala Hurry     HEPATITIS C, CHRONIC VIRAL, W/O HEPATIC COMA 12/24/2006   Qualifier: Diagnosis of  By: Leward Quan MD, Pamala Hurry      Chief Complaint  Patient presents with   Abnormal Lab    Gary Maddox is a 71 y.o. male.  Patient is a 72 y.o. M with a PMH of chronic back pain who is coming in after his PCP found a low sodium on lab work. His significant other at bedside works during the day and is only able to help him get food when she comes home. Due to his pain and progressive weakness/difficulty with mobility he is only eating one meal a day. Endorses falling recently trying to get back to bed after using the bathroom. Did not use his head.    Abnormal Lab     Home Medications Prior to Admission medications   Medication Sig Start Date End Date Taking? Authorizing Provider  carvedilol (COREG) 12.5 MG tablet TAKE 1 TABLET(12.5 MG) BY MOUTH TWICE DAILY WITH A MEAL Patient taking differently: Take 12.5 mg by mouth 2 (two) times daily.  01/04/18   Weber, Damaris Hippo, PA-C  gabapentin (NEURONTIN) 300 MG capsule Take 300 mg by mouth 2 (two) times daily.  11/12/17   [provider]  meloxicam (MOBIC) 15 MG tablet Take 15 mg by mouth daily.    [provider]  methocarbamol (ROBAXIN) 500 MG tablet Take 1 tablet by mouth 3 (three) times daily. Once a day 04/20/19   [provider]  Olmesartan-amLODIPine-HCTZ 40-10-25 MG TABS TAKE 1 TABLET BY MOUTH EVERY DAY Patient taking differently: Take 1 tablet by mouth daily.  01/04/18    Weber, Damaris Hippo, PA-C  sildenafil (REVATIO) 20 MG tablet Take 1-2 tablets (20-40 mg total) by mouth as needed. Patient taking differently: Take 20-40 mg by mouth daily as needed (erectile dysfunction).  01/19/18   Weber, Damaris Hippo, PA-C  sodium chloride (OCEAN) 0.65 % SOLN nasal spray Place 1 spray into both nostrils as needed for congestion.    [provider]  tetrahydrozoline 0.05 % ophthalmic solution Place 2 drops into both eyes daily as needed (dry, red eys).    [provider]      Allergies    Patient has no known allergies.    Review of Systems   Review of Systems  Constitutional:  Positive for activity change and fatigue.  HENT:  Negative for sore throat.   Respiratory:  Negative for cough and shortness of breath.   Musculoskeletal:  Positive for back pain.  Neurological:  Positive for weakness. Negative for light-headedness.   Physical Exam Updated Vital Signs BP (!) 89/74 (BP Location: Right Arm)    Pulse (!) 56    Temp 97.9 F (36.6 C)    Resp 11    SpO2 98%  Physical Exam Constitutional:      General: He is not in acute distress. HENT:     Head: Normocephalic and atraumatic.     Mouth/Throat:  Mouth: Mucous membranes are dry.  Eyes:     Extraocular Movements: Extraocular movements intact.  Cardiovascular:     Rate and Rhythm: Normal rate and regular rhythm.  Pulmonary:     Effort: Pulmonary effort is normal.     Breath sounds: Normal breath sounds.  Abdominal:     General: Abdomen is flat.     Palpations: Abdomen is soft.     Tenderness: There is no abdominal tenderness.  Skin:    General: Skin is warm and dry.     Comments: Decreased skin turgor  Neurological:     General: No focal deficit present.     Mental Status: He is alert and oriented to person, place, and time.  Psychiatric:        Mood and Affect: Affect is flat.    ED Results / Procedures / Treatments   Labs (all labs ordered are listed, but only abnormal results are  displayed) Labs Reviewed  CBC - Abnormal; Notable for the following components:      Result Value   RBC 3.70 (*)    HCT 36.3 (*)    MCH 35.7 (*)    MCHC 36.4 (*)    Platelets 428 (*)    All other components within normal limits  COMPREHENSIVE METABOLIC PANEL - Abnormal; Notable for the following components:   Sodium 120 (*)    Chloride 84 (*)    Glucose, Bld 119 (*)    Calcium 8.7 (*)    Albumin 2.9 (*)    AST 87 (*)    ALT 49 (*)    Alkaline Phosphatase 217 (*)    All other components within normal limits  RESP PANEL BY RT-PCR (FLU A&B, COVID) ARPGX2  URINALYSIS, ROUTINE W REFLEX MICROSCOPIC  SODIUM, URINE, RANDOM  OSMOLALITY, URINE  CREATININE, URINE, RANDOM  OSMOLALITY    EKG None  Radiology No results found.  Procedures Procedures    Medications Ordered in ED Medications  sodium chloride 0.9 % bolus 1,000 mL (has no administration in time range)    ED Course/ Medical Decision Making/ A&P                           Medical Decision Making  Patient having progressive weakness impacting ability to ambulate and safely eat/drink independently. Based on exam looks hypovolemic. Labs reviewed personally by me show a dropping sodium over the last several weeks, now 120. Suspect hypovolemic hyponatremia due to poor PO intake. Patient is also hypotensive to the low 90s mid 80s but denies any lightheadedness and is mentating appropriately. Given his worsening sodium, hypotension, falls, and deconditioning he will need admission. Will order urine studies and consult hospitalist for admission.   Final Clinical Impression(s) / ED Diagnoses Final diagnoses:  Hyponatremia  Physical deconditioning    Rx / DC Orders ED Discharge Orders     None         [name], MD 05/31/21 1225    [name], MD 06/01/21 [phone]

## 2021-05-31 NOTE — H&P (Signed)
History and Physical    Gary Maddox WOE:[id number] DOB: Feb 03, 1951 DOA: 05/31/2021  PCP: Mancel Bale, PA-C  Patient coming from: Home.  I have personally briefly reviewed patient's old medical records in [place]  Chief Complaint: Abnormal lab.  HPI: Gary Maddox is a 71 y.o. male with medical history significant of alcohol abuse, chronic back pain, type II DM without complication, hyperlipidemia, hypertension, GERD, chronic hepatitis C who was asked by his physician to come to the emergency department due to hyponatremia.  Him and his significant other has stated that he has been progressively weaker in the past few weeks.  He takes hydrochlorothiazide 25 mg p.o. daily.  He last took it this morning.  Denied abdominal pain, nausea, vomiting, diarrhea, melena or hematochezia.  He gets frequently constipated.  No fever, chills, sore throat, rhinorrhea, dyspnea, wheezing or hemoptysis.  No chest pain, palpitations, diaphoresis, PND, orthopnea, but occasionally gets pitting edema of the lower extremities.  No flank pain, dysuria, frequency or hematuria.  No polyuria, polydipsia, polyphagia or blurred vision.  ED Course: Initial vital signs were temperature 97.9 F, pulse 64, respirations 16, BP 95/54 mmHg O2 sat 100% on room air.  The patient received 1000 mL of NS bolus while in the emergency department.  Lab work: CBC has a white count 7.8, hemoglobin 13.2 g/dL platelets 428.  PT and INR were normal.  Phosphorus was 3.5 and magnesium 1.4 mg/dL.  CMP showed a sodium of 120 and chloride of 84 mmol/L, the rest of the electrolytes and renal function were normal.  Total protein was 6.9, albumin 2.9 g/dL.  AST 87, ALT 49 and alkaline phosphatase 217 units/L.  Total bilirubin was normal.  LFTs look better than in past measurements.  Review of Systems: As per HPI otherwise all other systems reviewed and are negative.  Past Medical History:  Diagnosis Date   Alcohol abuse    Back  pain    Diabetes mellitus without complication ([place])    Elevated LDL cholesterol level 10/05/2016   Essential hypertension 12/21/2006   Qualifier: Diagnosis of  By: Leward Quan MD, Pamala Hurry     GERD 12/21/2006   Qualifier: Diagnosis of  By: Leward Quan MD, [place], CHRONIC VIRAL, W/O HEPATIC COMA 12/24/2006   Qualifier: Diagnosis of  By: Leward Quan MD, Pamala Hurry     Past Surgical History:  Procedure Laterality Date   ABDOMINAL EXPOSURE N/A 02/23/2018   Procedure: ABDOMINAL EXPOSURE;  Surgeon: Rosetta Posner, MD;  Location: [facility] OR;  Service: Vascular;  Laterality: N/A;   ANTERIOR LUMBAR FUSION N/A 02/23/2018   Procedure: ANTERIOR LUMBAR INTERBODY FUSION LUMBAR 5 - SACRUM 1;  Surgeon: Phylliss Bob, MD;  Location: [facility];  Service: Orthopedics;  Laterality: N/A;   BACK SURGERY     FOOT SURGERY     KNEE SURGERY     Social History  reports that he has quit smoking. His smoking use included cigarettes. He has never used smokeless tobacco. He reports current alcohol use. He reports that he does not use drugs.  No Known Allergies  Family History  Problem Relation Age of Onset   Hypertension Mother    Hypertension Father    Prior to Admission medications   Medication Sig Start Date End Date Taking? Authorizing Provider  carvedilol (COREG) 12.5 MG tablet TAKE 1 TABLET(12.5 MG) BY MOUTH TWICE DAILY WITH A MEAL Patient taking differently: Take 12.5 mg by mouth 2 (two) times daily.  01/04/18   Windell Hummingbird  L, PA-C  gabapentin (NEURONTIN) 300 MG capsule Take 300 mg by mouth 2 (two) times daily.  11/12/17   [provider]  meloxicam (MOBIC) 15 MG tablet Take 15 mg by mouth daily.    [provider]  methocarbamol (ROBAXIN) 500 MG tablet Take 1 tablet by mouth 3 (three) times daily. Once a day 04/20/19   [provider]  Olmesartan-amLODIPine-HCTZ 40-10-25 MG TABS TAKE 1 TABLET BY MOUTH EVERY DAY Patient taking differently: Take 1 tablet by mouth daily.  01/04/18    Weber, Damaris Hippo, PA-C  sildenafil (REVATIO) 20 MG tablet Take 1-2 tablets (20-40 mg total) by mouth as needed. Patient taking differently: Take 20-40 mg by mouth daily as needed (erectile dysfunction).  01/19/18   Weber, Damaris Hippo, PA-C  sodium chloride (OCEAN) 0.65 % SOLN nasal spray Place 1 spray into both nostrils as needed for congestion.    [provider]  tetrahydrozoline 0.05 % ophthalmic solution Place 2 drops into both eyes daily as needed (dry, red eys).    [provider]   Physical Exam: Vitals:   05/31/21 0950 05/31/21 1054 05/31/21 1140 05/31/21 1212  BP: (!) 95/54 (!) 85/59 95/69 (!) 89/74  Pulse: 64 66 (!) 58 (!) 56  Resp: 16 16 12 11   Temp: 97.9 F (36.6 C)     SpO2: 100% 99% 98% 98%   Constitutional: NAD, calm, comfortable Eyes: PERRL, lids and conjunctivae normal ENMT: Mucous membranes are moist. Posterior pharynx clear of any exudate or lesions. Neck: normal, supple, no masses, no thyromegaly Respiratory: Clear to auscultation bilaterally, no wheezing, no crackles. Normal respiratory effort. No accessory muscle use.  Cardiovascular: Regular rate and rhythm, no murmurs / rubs / gallops. No extremity edema. 2+ pedal pulses. No carotid bruits.  Abdomen: No distention.  Soft, no tenderness, no masses palpated. No hepatosplenomegaly. Bowel sounds positive.  Musculoskeletal: no clubbing / cyanosis. Moderate generalized weakness. Good ROM, no contractures. Normal muscle tone.  Skin: Area of hypopigmentation in hands.  Decreased skin turgor.  No acute rashes, lesions, ulcers on limited dermatological semination. Neurologic: CN 2-12 grossly intact. Sensation intact, DTR normal. Strength 5/5 in all 4.  Psychiatric: Normal judgment and insight. Alert and oriented x 3. Normal mood.   Labs on Admission: I have personally reviewed following labs and imaging studies  CBC: Recent Labs  Lab 05/30/21 0950 05/31/21 1011  WBC 7.6 7.8  NEUTROABS 4.6  --   HGB 12.2*  13.2  HCT 33.6* 36.3*  MCV 98.5 98.1  PLT 455* 428*    Basic Metabolic Panel: Recent Labs  Lab 05/30/21 0950 05/31/21 1011 05/31/21 1254  NA 123* 120*  --   K 3.8 3.8  --   CL 85* 84*  --   CO2 29 26  --   GLUCOSE 147* 119*  --   BUN 12 14  --   CREATININE 0.98 0.78  --   CALCIUM 8.9 8.7*  --   MG 1.3*  --  1.4*  PHOS  --   --  3.5    GFR: CrCl cannot be calculated (Unknown ideal weight.).  Liver Function Tests: Recent Labs  Lab 05/30/21 0950 05/31/21 1011  AST 83* 87*  ALT 48* 49*  ALKPHOS 216* 217*  BILITOT 1.1 1.2  PROT 6.3* 6.9  ALBUMIN 2.6* 2.9*   Radiological Exams on Admission: No results found.  EKG: Independently reviewed.   Assessment/Plan Principal Problem:   Hyponatremia Observation/telemetry. Fluid restriction. Continue normal saline infusion. Hold hydrochlorothiazide.  Monitor sodium level every 6 hours. Check urine sodium level. Check urine osmolality. BMP in AM.  Active Problems:   Essential hypertension Hold antihypertensives at this time. Monitor blood pressure.    Type 2 diabetes mellitus without complication,    without long-term current use of insulin (HCC) Carbohydrate modified diet. CBG monitoring before meals. Check hemoglobin A1c.    Elevated LDL cholesterol level Deferred treatment due to hep C.    Moderate protein malnutrition (HCC) Protein supplementation. : Seizure nutritional services evaluation.    Transaminitis In the setting of   Chronic hepatitis C virus infection (HCC) Check PT/INR. Monitor transaminases.    Hypomagnesemia Replenishing. Magnesium oxide 1 or twice a day at home. This should also help him with constipation. Written instructions given to his significant other.     DVT prophylaxis: Lovenox SQ. Code Status:   Full code. Family Communication:  His spouse was at bedside. Disposition Plan:   Patient is from:  Home.  Anticipated DC to:  Home.  Anticipated DC date:  06/01/2021 or  06/02/2021.  Anticipated DC barriers: Clinical condition/sodium level.  Consults called:  Believe Admission status:  Telemetry/observation.  Severity of Illness: High severity in the setting of a critically low sodium level.  Reubin Milan MD [facility]  How to contact the [facility] Attending or Consulting provider [place] or covering provider during after hours [place], for this patient?   Check the care team in [facility] and look for a) attending/consulting [facility] provider listed and b) the [facility] team listed Log into [email or web address] and use [place]'s universal password to access. If you do not have the password, please contact the hospital operator. Locate the [facility] provider you are looking for under [facility] and page to a number that you can be directly reached. If you still have difficulty reaching the provider, please page the [facility] (Director on Call) for the Hospitalists listed on amion for assistance.  05/31/2021, 2:23 PM   This document was prepared using Dragon voice recognition software and may contain some unintended transcription errors.

## 2021-05-31 NOTE — ED Notes (Signed)
35 ml bladder scan

## 2021-06-01 ENCOUNTER — Other Ambulatory Visit: Payer: Self-pay

## 2021-06-01 DIAGNOSIS — E871 Hypo-osmolality and hyponatremia: Secondary | ICD-10-CM | POA: Diagnosis present

## 2021-06-01 DIAGNOSIS — Z6821 Body mass index (BMI) 21.0-21.9, adult: Secondary | ICD-10-CM | POA: Diagnosis not present

## 2021-06-01 DIAGNOSIS — K5900 Constipation, unspecified: Secondary | ICD-10-CM | POA: Diagnosis present

## 2021-06-01 DIAGNOSIS — Z20822 Contact with and (suspected) exposure to covid-19: Secondary | ICD-10-CM | POA: Diagnosis present

## 2021-06-01 DIAGNOSIS — E440 Moderate protein-calorie malnutrition: Secondary | ICD-10-CM | POA: Diagnosis present

## 2021-06-01 DIAGNOSIS — B182 Chronic viral hepatitis C: Secondary | ICD-10-CM | POA: Diagnosis present

## 2021-06-01 DIAGNOSIS — Z8249 Family history of ischemic heart disease and other diseases of the circulatory system: Secondary | ICD-10-CM | POA: Diagnosis not present

## 2021-06-01 DIAGNOSIS — Z981 Arthrodesis status: Secondary | ICD-10-CM | POA: Diagnosis not present

## 2021-06-01 DIAGNOSIS — M545 Low back pain, unspecified: Secondary | ICD-10-CM | POA: Diagnosis present

## 2021-06-01 DIAGNOSIS — E785 Hyperlipidemia, unspecified: Secondary | ICD-10-CM | POA: Diagnosis present

## 2021-06-01 DIAGNOSIS — R269 Unspecified abnormalities of gait and mobility: Secondary | ICD-10-CM | POA: Diagnosis present

## 2021-06-01 DIAGNOSIS — K219 Gastro-esophageal reflux disease without esophagitis: Secondary | ICD-10-CM | POA: Diagnosis present

## 2021-06-01 DIAGNOSIS — Z791 Long term (current) use of non-steroidal anti-inflammatories (NSAID): Secondary | ICD-10-CM | POA: Diagnosis not present

## 2021-06-01 DIAGNOSIS — I10 Essential (primary) hypertension: Secondary | ICD-10-CM | POA: Diagnosis present

## 2021-06-01 DIAGNOSIS — Z79899 Other long term (current) drug therapy: Secondary | ICD-10-CM | POA: Diagnosis not present

## 2021-06-01 DIAGNOSIS — E119 Type 2 diabetes mellitus without complications: Secondary | ICD-10-CM | POA: Diagnosis present

## 2021-06-01 DIAGNOSIS — E7800 Pure hypercholesterolemia, unspecified: Secondary | ICD-10-CM | POA: Diagnosis not present

## 2021-06-01 DIAGNOSIS — G8929 Other chronic pain: Secondary | ICD-10-CM | POA: Diagnosis present

## 2021-06-01 DIAGNOSIS — Z87891 Personal history of nicotine dependence: Secondary | ICD-10-CM | POA: Diagnosis not present

## 2021-06-01 DIAGNOSIS — I959 Hypotension, unspecified: Secondary | ICD-10-CM | POA: Diagnosis present

## 2021-06-01 LAB — GLUCOSE, CAPILLARY
Glucose-Capillary: 84 mg/dL (ref 70–99)
Glucose-Capillary: 124 mg/dL — ABNORMAL HIGH (ref 70–99)
Glucose-Capillary: 77 mg/dL (ref 70–99)
Glucose-Capillary: 96 mg/dL (ref 70–99)

## 2021-06-01 LAB — BASIC METABOLIC PANEL
Anion gap: 11 (ref 5–15)
BUN: 11 mg/dL (ref 8–23)
CO2: 24 mmol/L (ref 22–32)
Calcium: 8 mg/dL — ABNORMAL LOW (ref 8.9–10.3)
Chloride: 91 mmol/L — ABNORMAL LOW (ref 98–111)
Creatinine, Ser: 0.62 mg/dL (ref 0.61–1.24)
GFR, Estimated: >60 mL/min (ref >60)
Glucose, Bld: 83 mg/dL (ref 70–99)
Potassium: 3.5 mmol/L (ref 3.5–5.1)
Sodium: 126 mmol/L — ABNORMAL LOW (ref 135–145)

## 2021-06-01 LAB — OSMOLALITY: Osmolality: 254 mOsm/kg — ABNORMAL LOW (ref 275–295)

## 2021-06-01 LAB — OSMOLALITY, URINE: Osmolality, Ur: 384 mOsm/kg (ref 300–900)

## 2021-06-01 LAB — HIV ANTIBODY (ROUTINE TESTING W REFLEX): HIV Screen 4th Generation wRfx: NONREACTIVE

## 2021-06-01 MED ORDER — SALINE SPRAY 0.65 % NA SOLN
1.0000 | NASAL | Status: DC | PRN
  Filled 2021-06-01: qty 44

## 2021-06-01 NOTE — Progress Notes (Signed)
PROGRESS NOTE    Gary Maddox  JSE:[id number] DOB: [birth year] DOA: 05/31/2021 PCP: Mancel Bale, PA-C    Brief Narrative:  71 y.o. male with medical history significant of alcohol abuse, chronic back pain, type II DM without complication, hyperlipidemia, hypertension, GERD, chronic hepatitis C who was asked by his physician to come to the emergency department due to hyponatremia.  Him and his significant other has stated that he has been progressively weaker in the past few weeks.  He takes hydrochlorothiazide 25 mg p.o. daily.  He last took it this morning.  Denied abdominal pain, nausea, vomiting, diarrhea, melena or hematochezia.  He gets frequently constipated.  No fever, chills, sore throat, rhinorrhea, dyspnea, wheezing or hemoptysis.  No chest pain, palpitations, diaphoresis, PND, orthopnea, but occasionally gets pitting edema of the lower extremities.  No flank pain, dysuria, frequency or hematuria.  No polyuria, polydipsia, polyphagia or blurred vision  Assessment & Plan:   Principal Problem:   Hyponatremia Active Problems:   Chronic hepatitis C virus infection ([place])   Essential hypertension   Type 2 diabetes mellitus without complication, without long-term current use of insulin (HCC)   Elevated LDL cholesterol level   Moderate protein malnutrition (HCC)   Transaminitis   Hypomagnesemia  Principal Problem:   Hyponatremia Cont fluid restriction. Continue normal saline infusion. Hold hydrochlorothiazide. Sodium improving Recheck bmet in AM   Active Problems:   Essential hypertension Hold antihypertensives at this time. Monitor blood pressure.     Type 2 diabetes mellitus without complication,    without long-term current use of insulin (HCC) Carbohydrate modified diet. CBG monitoring before meals. A1c 5.9     Elevated LDL cholesterol level Deferred treatment due to hep C.     Moderate protein malnutrition (HCC) Protein supplementation. : Seizure  nutritional services evaluation.     Transaminitis In the setting of   Chronic hepatitis C virus infection (HCC) LFT's baseline mildly elevated Recheck cmp in AM     Hypomagnesemia Replaced   DVT prophylaxis: Lovenox subq Code Status: Full Family Communication: Pt in room, family at bedside  Status is: Observation  The patient will require care spanning > 2 midnights and should be moved to inpatient because: severity of illness      Consultants:    Procedures:    Antimicrobials: Anti-infectives (From admission, onward)    None       Subjective: Without complaints  Objective: Vitals:   05/31/21 2130 06/01/21 0121 06/01/21 0505 06/01/21 1648  BP: 104/64 (!) 107/59 111/85 131/85  Pulse: 66 (!) 56 80 67  Resp: 18  18 16   Temp: 97.6 F (36.4 C) 98.1 F (36.7 C) 98.4 F (36.9 C) 97.7 F (36.5 C)  TempSrc: Oral Oral Oral Oral  SpO2: 97% 99% 97% 97%  Weight:        Intake/Output Summary (Last 24 hours) at 06/01/2021 1919 Last data filed at 06/01/2021 0737 Gross per 24 hour  Intake 1177.15 ml  Output 1 ml  Net 1176.15 ml   Filed Weights   05/31/21 1621  Weight: 72.6 kg    Examination: General exam: Awake, laying in bed, in nad Respiratory system: Normal respiratory effort, no wheezing Cardiovascular system: regular rate, s1, s2 Gastrointestinal system: Soft, nondistended, positive BS Central nervous system: CN2-12 grossly intact, strength intact Extremities: Perfused, no clubbing Skin: Normal skin turgor, no notable skin lesions seen Psychiatry: Mood normal // no visual hallucinations   Data Reviewed: I have personally reviewed following labs and imaging studies  CBC: Recent Labs  Lab 05/30/21 0950 05/31/21 1011  WBC 7.6 7.8  NEUTROABS 4.6  --   HGB 12.2* 13.2  HCT 33.6* 36.3*  MCV 98.5 98.1  PLT 455* 628*   Basic Metabolic Panel: Recent Labs  Lab 05/30/21 0950 05/31/21 1011 05/31/21 1254 05/31/21 2158 06/01/21 1645  NA 123* 120*   --  121* 126*  K 3.8 3.8  --   --  3.5  CL 85* 84*  --   --  91*  CO2 29 26  --   --  24  GLUCOSE 147* 119*  --   --  83  BUN 12 14  --   --  11  CREATININE 0.98 0.78  --   --  0.62  CALCIUM 8.9 8.7*  --   --  8.0*  MG 1.3*  --  1.4*  --   --   PHOS  --   --  3.5  --   --    GFR: CrCl cannot be calculated (Unknown ideal weight.). Liver Function Tests: Recent Labs  Lab 05/30/21 0950 05/31/21 1011  AST 83* 87*  ALT 48* 49*  ALKPHOS 216* 217*  BILITOT 1.1 1.2  PROT 6.3* 6.9  ALBUMIN 2.6* 2.9*   No results for input(s): LIPASE, AMYLASE in the last 168 hours. No results for input(s): AMMONIA in the last 168 hours. Coagulation Profile: Recent Labs  Lab 05/31/21 1254  INR 1.1   Cardiac Enzymes: No results for input(s): CKTOTAL, CKMB, CKMBINDEX, TROPONINI in the last 168 hours. BNP (last 3 results) No results for input(s): PROBNP in the last 8760 hours. HbA1C: Recent Labs    05/31/21 1011  HGBA1C 5.9*   CBG: Recent Labs  Lab 05/31/21 2126 06/01/21 0742 06/01/21 1201 06/01/21 1646  GLUCAP 113* 84 124* 77   Lipid Profile: No results for input(s): CHOL, HDL, LDLCALC, TRIG, CHOLHDL, LDLDIRECT in the last 72 hours. Thyroid Function Tests: No results for input(s): TSH, T4TOTAL, FREET4, T3FREE, THYROIDAB in the last 72 hours. Anemia Panel: No results for input(s): VITAMINB12, FOLATE, FERRITIN, TIBC, IRON, RETICCTPCT in the last 72 hours. Sepsis Labs: No results for input(s): PROCALCITON, LATICACIDVEN in the last 168 hours.  Recent Results (from the past 240 hour(s))  Resp Panel by RT-PCR (Flu A&B, Covid) Nasopharyngeal Swab     Status: None   Collection Time: 05/31/21 12:54 PM   Specimen: Nasopharyngeal Swab; Nasopharyngeal(NP) swabs in vial transport medium  Result Value Ref Range Status   SARS Coronavirus 2 by RT PCR NEGATIVE NEGATIVE Final    Comment: (NOTE) SARS-CoV-2 target nucleic acids are NOT DETECTED.  The SARS-CoV-2 RNA is generally detectable in  upper respiratory specimens during the acute phase of infection. The lowest concentration of SARS-CoV-2 viral copies this assay can detect is 138 copies/mL. A negative result does not preclude SARS-Cov-2 infection and should not be used as the sole basis for treatment or other patient management decisions. A negative result may occur with  improper specimen collection/handling, submission of specimen other than nasopharyngeal swab, presence of viral mutation(s) within the areas targeted by this assay, and inadequate number of viral copies(<138 copies/mL). A negative result must be combined with clinical observations, patient history, and epidemiological information. The expected result is Negative.  Fact Sheet for Patients:  EntrepreneurPulse.com.au  Fact Sheet for Healthcare Providers:  IncredibleEmployment.be  This test is no t yet approved or cleared by the [place] FDA and  has been authorized for detection and/or diagnosis of SARS-CoV-2 by  FDA under an Emergency Use Authorization (EUA). This EUA will remain  in effect (meaning this test can be used) for the duration of the COVID-19 declaration under Section 564(b)(1) of the Act, 21 U.S.C.section 360bbb-3(b)(1), unless the authorization is terminated  or revoked sooner.       Influenza A by PCR NEGATIVE NEGATIVE Final   Influenza B by PCR NEGATIVE NEGATIVE Final    Comment: (NOTE) The Xpert Xpress SARS-CoV-2/FLU/RSV plus assay is intended as an aid in the diagnosis of influenza from Nasopharyngeal swab specimens and should not be used as a sole basis for treatment. Nasal washings and aspirates are unacceptable for Xpert Xpress SARS-CoV-2/FLU/RSV testing.  Fact Sheet for Patients: EntrepreneurPulse.com.au  Fact Sheet for Healthcare Providers: IncredibleEmployment.be  This test is not yet approved or cleared by the [place] FDA and has been  authorized for detection and/or diagnosis of SARS-CoV-2 by FDA under an Emergency Use Authorization (EUA). This EUA will remain in effect (meaning this test can be used) for the duration of the COVID-19 declaration under Section 564(b)(1) of the Act, 21 U.S.C. section 360bbb-3(b)(1), unless the authorization is terminated or revoked.  Performed at [facility] [place] [address], [place], [place] [address]      Radiology Studies: No results found.  Scheduled Meds:  enoxaparin (LOVENOX) injection  40 mg Subcutaneous Q24H   gabapentin  300 mg Oral BID   meloxicam  7.5 mg Oral Daily   methocarbamol  500 mg Oral BID   Continuous Infusions:  sodium chloride 100 mL/hr at 06/01/21 1159   sodium chloride Stopped (05/31/21 1624)     LOS: 0 days   Marylu Lund, MD [facility] Pager On Amion  If 7PM-7AM, please contact night-coverage 06/01/2021, 7:19 PM

## 2021-06-02 DIAGNOSIS — I10 Essential (primary) hypertension: Secondary | ICD-10-CM

## 2021-06-02 LAB — COMPREHENSIVE METABOLIC PANEL
ALT: 41 U/L (ref 0–44)
AST: 65 U/L — ABNORMAL HIGH (ref 15–41)
Albumin: 2.5 g/dL — ABNORMAL LOW (ref 3.5–5.0)
Alkaline Phosphatase: 180 U/L — ABNORMAL HIGH (ref 38–126)
Anion gap: 8 (ref 5–15)
BUN: 9 mg/dL (ref 8–23)
CO2: 28 mmol/L (ref 22–32)
Calcium: 8 mg/dL — ABNORMAL LOW (ref 8.9–10.3)
Chloride: 94 mmol/L — ABNORMAL LOW (ref 98–111)
Creatinine, Ser: 0.7 mg/dL (ref 0.61–1.24)
GFR, Estimated: >60 mL/min (ref >60)
Glucose, Bld: 91 mg/dL (ref 70–99)
Potassium: 3.2 mmol/L — ABNORMAL LOW (ref 3.5–5.1)
Sodium: 130 mmol/L — ABNORMAL LOW (ref 135–145)
Total Bilirubin: 1.2 mg/dL (ref 0.3–1.2)
Total Protein: 6.2 g/dL — ABNORMAL LOW (ref 6.5–8.1)

## 2021-06-02 LAB — CBC
HCT: 33.2 % — ABNORMAL LOW (ref 39.0–52.0)
Hemoglobin: 11.9 g/dL — ABNORMAL LOW (ref 13.0–17.0)
MCH: 35.7 pg — ABNORMAL HIGH (ref 26.0–34.0)
MCHC: 35.8 g/dL (ref 30.0–36.0)
MCV: 99.7 fL (ref 80.0–100.0)
Platelets: 386 10*3/uL (ref 150–400)
RBC: 3.33 MIL/uL — ABNORMAL LOW (ref 4.22–5.81)
RDW: 12.2 % (ref 11.5–15.5)
WBC: 7.3 10*3/uL (ref 4.0–10.5)
nRBC: 0 % (ref 0.0–0.2)

## 2021-06-02 LAB — GLUCOSE, CAPILLARY
Glucose-Capillary: 95 mg/dL (ref 70–99)
Glucose-Capillary: 101 mg/dL — ABNORMAL HIGH (ref 70–99)

## 2021-06-02 MED ORDER — POTASSIUM CHLORIDE CRYS ER 20 MEQ PO TBCR
40.0000 meq | EXTENDED_RELEASE_TABLET | Freq: Once | ORAL | Status: AC
  Administered 2021-06-02: 40 meq via ORAL
  Filled 2021-06-02: qty 2

## 2021-06-02 NOTE — Discharge Summary (Signed)
Physician Discharge Summary  Gary Maddox MPN:[id number] DOB: [birth year] DOA: 05/31/2021  PCP: Mancel Bale, PA-C  Admit date: 05/31/2021 Discharge date: 06/02/2021  Admitted From: Home Disposition:  Home  Recommendations for Outpatient Follow-up:  Follow up with PCP in 1-2 weeks Recommend repeat bmet in 1-2 weeks, focus on sodium  Home Health:PT, OT   Discharge Condition:Improved CODE STATUS:Full Diet recommendation: Diabetic   Brief/Interim Summary: 71 y.o. male with medical history significant of alcohol abuse, chronic back pain, type II DM without complication, hyperlipidemia, hypertension, GERD, chronic hepatitis C who was asked by his physician to come to the emergency department due to hyponatremia.  Him and his significant other has stated that he has been progressively weaker in the past few weeks.  He takes hydrochlorothiazide 25 mg p.o. daily.  He last took it this morning.  Denied abdominal pain, nausea, vomiting, diarrhea, melena or hematochezia.  He gets frequently constipated.  No fever, chills, sore throat, rhinorrhea, dyspnea, wheezing or hemoptysis.  No chest pain, palpitations, diaphoresis, PND, orthopnea, but occasionally gets pitting edema of the lower extremities.  No flank pain, dysuria, frequency or hematuria.  No polyuria, polydipsia, polyphagia or blurred vision   Discharge Diagnoses:  Principal Problem:   Hyponatremia Active Problems:   Chronic hepatitis C virus infection ([place])   Essential hypertension   Type 2 diabetes mellitus without complication, without long-term current use of insulin (HCC)   Elevated LDL cholesterol level   Moderate protein malnutrition (HCC)   Transaminitis   Hypomagnesemia  Principal Problem:   Hyponatremia -Cont fluid restriction. -Have discontinued hydrochlorothiazide. -Sodium improved to 130 -Recommend repeat BMET in 1-2 weeks   Active Problems:   Essential hypertension Held antihypertensives while in hospital Cont  coreg per home regimen on d/c     Type 2 diabetes mellitus without complication,    without long-term current use of insulin (HCC) Carbohydrate modified diet. CBG monitoring before meals. A1c 5.9     Elevated LDL cholesterol level Deferred treatment due to hep C.     Moderate protein malnutrition (HCC) Protein supplementation. : Seizure nutritional services evaluation.     Transaminitis In the setting of   Chronic hepatitis C virus infection ([place]) LFT's baseline mildly elevated LFT's remained stable     Hypomagnesemia Replaced   Discharge Instructions   Allergies as of 06/02/2021   No Known Allergies      Medication List     STOP taking these medications    Olmesartan-amLODIPine-HCTZ 40-10-25 MG Tabs   sildenafil 20 MG tablet Commonly known as: REVATIO       TAKE these medications    carvedilol 12.5 MG tablet Commonly known as: COREG TAKE 1 TABLET(12.5 MG) BY MOUTH TWICE DAILY WITH A MEAL What changed: See the new instructions.   diazepam 5 MG tablet Commonly known as: VALIUM Take 5 mg by mouth daily as needed (for MRI).   gabapentin 300 MG capsule Commonly known as: NEURONTIN Take 300 mg by mouth 2 (two) times daily.   hydrocortisone 2.5 % rectal cream Commonly known as: ANUSOL-HC Place 1 application rectally 2 (two) times daily.   meloxicam 15 MG tablet Commonly known as: MOBIC Take 15 mg by mouth daily as needed for pain.   methocarbamol 500 MG tablet Commonly known as: ROBAXIN Take 500 mg by mouth 3 (three) times daily. Once a day What changed: Another medication with the same name was removed. Continue taking this medication, and follow the directions you see here.   silver sulfADIAZINE 1 %  cream Commonly known as: SILVADENE Apply 1 application topically daily.   sodium chloride 0.65 % Soln nasal spray Commonly known as: OCEAN Place 1 spray into both nostrils daily.   tetrahydrozoline 0.05 % ophthalmic solution Place 2 drops into  both eyes daily as needed (dry, red eys).   traMADol 50 MG tablet Commonly known as: ULTRAM Take 50 mg by mouth daily as needed for moderate pain.               Durable Medical Equipment  (From admission, onward)           Start     Ordered   06/02/21 1321  For home use only DME 3 n 1  Once        06/02/21 1320            Follow-up Information     [facility] Follow up.   Specialty: Home Health Services Contact information: [address] [place] [address] [phone]         Mancel Bale, PA-C Follow up in 2 week(s).   Specialty: Physician Assistant Why: Hospital follow up Contact information: [address] [place] [phone]                No Known Allergies   Procedures/Studies: MR LUMBAR SPINE WO CONTRAST  Result Date: 05/17/2021 CLINICAL DATA:  Low back pain. Prior surgery. Patient reports that the current symptoms are new. EXAM: MRI LUMBAR SPINE WITHOUT CONTRAST TECHNIQUE: Multiplanar, multisequence MR imaging of the lumbar spine was performed. No intravenous contrast was administered. COMPARISON:  MRI lumbar spine without contrast 12/31/2020 FINDINGS: Segmentation: 5 non rib-bearing lumbar type vertebral bodies are present. The lowest fully formed vertebral body is L5. Alignment: 4 mm retrolisthesis is again noted at L2-3. Slight retrolisthesis present at L3-4. Rightward curvature is centered at L2. Vertebrae: Chronic fatty endplate marrow changes are present on the left at L2-3. Multilevel fatty endplate changes are present anteriorly involving T11-12, L1-2 and to lesser extent T12-L1. Vertebral body heights are maintained. Conus medullaris and cauda equina: Conus extends to the L1-2 level. Conus and cauda equina appear normal. Paraspinal and other soft tissues: Cyst at the upper pole of the right kidney are partially imaged. Limited imaging the a abdomen is otherwise unremarkable.  Disc levels: L1-2: Negative. L2-3: A broad-based disc protrusion is asymmetric to the left. Moderate facet hypertrophy is worse left than right. Moderate left subarticular and moderate to severe left foraminal stenosis has progressed. L3-4: A leftward disc protrusion is present. Moderate facet hypertrophy is worse on the left. Mild left subarticular and moderate left foraminal narrowing has progressed. L4-5: Progressive adjacent level disease is present with a broad-based disc protrusion and advanced facet hypertrophy. Moderate to severe central canal stenosis is now present. Progressive severe right and moderate left foraminal narrowing is present. L5-S1: Anterior and posterior fusion is present. No residual or recurrent stenosis is present. IMPRESSION: 1. Anterior and posterior fusion at L5-S1 without residual or recurrent stenosis. 2. Sure progressive adjacent level disease at L4-5 with moderate to severe central canal stenosis, severe right and moderate left foraminal narrowing. 3. Progressive adjacent level disease at L3-4 with mild left subarticular and moderate left foraminal stenosis. 4. Moderate left subarticular and moderate to severe left foraminal stenosis at L2-3 has progressed. Electronically Signed   By: San Morelle M.D.   On: 05/17/2021 18:41    Subjective: Eager to go home  Discharge Exam:  Vitals:   06/01/21 1957 06/02/21 0618  BP: 129/82 117/75  Pulse: (!) 53 84  Resp: 18 16  Temp: (!) 97.5 F (36.4 C) 98.9 F (37.2 C)  SpO2: 99% 96%   Vitals:   06/01/21 0505 06/01/21 1648 06/01/21 1957 06/02/21 0618  BP: 111/85 131/85 129/82 117/75  Pulse: 80 67 (!) 53 84  Resp: 18 16 18 16   Temp: 98.4 F (36.9 C) 97.7 F (36.5 C) (!) 97.5 F (36.4 C) 98.9 F (37.2 C)  TempSrc: Oral Oral Oral Oral  SpO2: 97% 97% 99% 96%  Weight:        General: Pt is alert, awake, not in acute distress Cardiovascular: RRR, S1/S2 + Respiratory: CTA bilaterally, no wheezing, no  rhonchi Abdominal: Soft, NT, ND, bowel sounds + Extremities: no edema, no cyanosis   The results of significant diagnostics from this hospitalization (including imaging, microbiology, ancillary and laboratory) are listed below for reference.     Microbiology: Recent Results (from the past 240 hour(s))  Resp Panel by RT-PCR (Flu A&B, Covid) Nasopharyngeal Swab     Status: None   Collection Time: 05/31/21 12:54 PM   Specimen: Nasopharyngeal Swab; Nasopharyngeal(NP) swabs in vial transport medium  Result Value Ref Range Status   SARS Coronavirus 2 by RT PCR NEGATIVE NEGATIVE Final    Comment: (NOTE) SARS-CoV-2 target nucleic acids are NOT DETECTED.  The SARS-CoV-2 RNA is generally detectable in upper respiratory specimens during the acute phase of infection. The lowest concentration of SARS-CoV-2 viral copies this assay can detect is 138 copies/mL. A negative result does not preclude SARS-Cov-2 infection and should not be used as the sole basis for treatment or other patient management decisions. A negative result may occur with  improper specimen collection/handling, submission of specimen other than nasopharyngeal swab, presence of viral mutation(s) within the areas targeted by this assay, and inadequate number of viral copies(<138 copies/mL). A negative result must be combined with clinical observations, patient history, and epidemiological information. The expected result is Negative.  Fact Sheet for Patients:  EntrepreneurPulse.com.au  Fact Sheet for Healthcare Providers:  IncredibleEmployment.be  This test is no t yet approved or cleared by the [place] FDA and  has been authorized for detection and/or diagnosis of SARS-CoV-2 by FDA under an Emergency Use Authorization (EUA). This EUA will remain  in effect (meaning this test can be used) for the duration of the COVID-19 declaration under Section 564(b)(1) of the Act,  21 U.S.C.section 360bbb-3(b)(1), unless the authorization is terminated  or revoked sooner.       Influenza A by PCR NEGATIVE NEGATIVE Final   Influenza B by PCR NEGATIVE NEGATIVE Final    Comment: (NOTE) The Xpert Xpress SARS-CoV-2/FLU/RSV plus assay is intended as an aid in the diagnosis of influenza from Nasopharyngeal swab specimens and should not be used as a sole basis for treatment. Nasal washings and aspirates are unacceptable for Xpert Xpress SARS-CoV-2/FLU/RSV testing.  Fact Sheet for Patients: EntrepreneurPulse.com.au  Fact Sheet for Healthcare Providers: IncredibleEmployment.be  This test is not yet approved or cleared by the [place] FDA and has been authorized for detection and/or diagnosis of SARS-CoV-2 by FDA under an Emergency Use Authorization (EUA). This EUA will remain in effect (meaning this test can be used) for the duration of the COVID-19 declaration under Section 564(b)(1) of the Act, 21 U.S.C. section 360bbb-3(b)(1), unless the authorization is terminated or revoked.  Performed at [facility], [place] [address] [place], [place] [address]  Labs: BNP (last 3 results) No results for input(s): BNP in the last 8760 hours. Basic Metabolic Panel: Recent Labs  Lab 05/30/21 0950 05/31/21 1011 05/31/21 1254 05/31/21 2158 06/01/21 1645 06/02/21 0523  NA 123* 120*  --  121* 126* 130*  K 3.8 3.8  --   --  3.5 3.2*  CL 85* 84*  --   --  91* 94*  CO2 29 26  --   --  24 28  GLUCOSE 147* 119*  --   --  83 91  BUN 12 14  --   --  11 9  CREATININE 0.98 0.78  --   --  0.62 0.70  CALCIUM 8.9 8.7*  --   --  8.0* 8.0*  MG 1.3*  --  1.4*  --   --   --   PHOS  --   --  3.5  --   --   --    Liver Function Tests: Recent Labs  Lab 05/30/21 0950 05/31/21 1011 06/02/21 0523  AST 83* 87* 65*  ALT 48* 49* 41  ALKPHOS 216* 217* 180*  BILITOT 1.1 1.2 1.2  PROT 6.3* 6.9 6.2*  ALBUMIN 2.6* 2.9*  2.5*   No results for input(s): LIPASE, AMYLASE in the last 168 hours. No results for input(s): AMMONIA in the last 168 hours. CBC: Recent Labs  Lab 05/30/21 0950 05/31/21 1011 06/02/21 0523  WBC 7.6 7.8 7.3  NEUTROABS 4.6  --   --   HGB 12.2* 13.2 11.9*  HCT 33.6* 36.3* 33.2*  MCV 98.5 98.1 99.7  PLT 455* 428* 386   Cardiac Enzymes: No results for input(s): CKTOTAL, CKMB, CKMBINDEX, TROPONINI in the last 168 hours. BNP: Invalid input(s): POCBNP CBG: Recent Labs  Lab 06/01/21 1201 06/01/21 1646 06/01/21 2035 06/02/21 0742 06/02/21 1158  GLUCAP 124* 77 96 95 101*   D-Dimer No results for input(s): DDIMER in the last 72 hours. Hgb A1c Recent Labs    05/31/21 1011  HGBA1C 5.9*   Lipid Profile No results for input(s): CHOL, HDL, LDLCALC, TRIG, CHOLHDL, LDLDIRECT in the last 72 hours. Thyroid function studies No results for input(s): TSH, T4TOTAL, T3FREE, THYROIDAB in the last 72 hours.  Invalid input(s): FREET3 Anemia work up No results for input(s): VITAMINB12, FOLATE, FERRITIN, TIBC, IRON, RETICCTPCT in the last 72 hours. Urinalysis    Component Value Date/Time   COLORURINE YELLOW 05/31/2021 1742   APPEARANCEUR CLEAR 05/31/2021 1742   APPEARANCEUR Clear 11/09/2017 1126   LABSPEC 1.013 05/31/2021 1742   PHURINE 6.0 05/31/2021 1742   GLUCOSEU NEGATIVE 05/31/2021 1742   HGBUR NEGATIVE 05/31/2021 1742   BILIRUBINUR NEGATIVE 05/31/2021 1742   BILIRUBINUR Negative 11/09/2017 1126   [place] 05/31/2021 1742   PROTEINUR 30 (A) 05/31/2021 1742   UROBILINOGEN 1.0 10/02/2017 1524   NITRITE NEGATIVE 05/31/2021 1742   LEUKOCYTESUR NEGATIVE 05/31/2021 1742   Sepsis Labs Invalid input(s): PROCALCITONIN,  WBC,  LACTICIDVEN Microbiology Recent Results (from the past 240 hour(s))  Resp Panel by RT-PCR (Flu A&B, Covid) Nasopharyngeal Swab     Status: None   Collection Time: 05/31/21 12:54 PM   Specimen: Nasopharyngeal Swab; Nasopharyngeal(NP) swabs in vial  transport medium  Result Value Ref Range Status   SARS Coronavirus 2 by RT PCR NEGATIVE NEGATIVE Final    Comment: (NOTE) SARS-CoV-2 target nucleic acids are NOT DETECTED.  The SARS-CoV-2 RNA is generally detectable in upper respiratory specimens during the acute phase of infection. The lowest concentration of SARS-CoV-2 viral copies  this assay can detect is 138 copies/mL. A negative result does not preclude SARS-Cov-2 infection and should not be used as the sole basis for treatment or other patient management decisions. A negative result may occur with  improper specimen collection/handling, submission of specimen other than nasopharyngeal swab, presence of viral mutation(s) within the areas targeted by this assay, and inadequate number of viral copies(<138 copies/mL). A negative result must be combined with clinical observations, patient history, and epidemiological information. The expected result is Negative.  Fact Sheet for Patients:  EntrepreneurPulse.com.au  Fact Sheet for Healthcare Providers:  IncredibleEmployment.be  This test is no t yet approved or cleared by the [place] FDA and  has been authorized for detection and/or diagnosis of SARS-CoV-2 by FDA under an Emergency Use Authorization (EUA). This EUA will remain  in effect (meaning this test can be used) for the duration of the COVID-19 declaration under Section 564(b)(1) of the Act, 21 U.S.C.section 360bbb-3(b)(1), unless the authorization is terminated  or revoked sooner.       Influenza A by PCR NEGATIVE NEGATIVE Final   Influenza B by PCR NEGATIVE NEGATIVE Final    Comment: (NOTE) The Xpert Xpress SARS-CoV-2/FLU/RSV plus assay is intended as an aid in the diagnosis of influenza from Nasopharyngeal swab specimens and should not be used as a sole basis for treatment. Nasal washings and aspirates are unacceptable for Xpert Xpress SARS-CoV-2/FLU/RSV testing.  Fact  Sheet for Patients: EntrepreneurPulse.com.au  Fact Sheet for Healthcare Providers: IncredibleEmployment.be  This test is not yet approved or cleared by the [place] FDA and has been authorized for detection and/or diagnosis of SARS-CoV-2 by FDA under an Emergency Use Authorization (EUA). This EUA will remain in effect (meaning this test can be used) for the duration of the COVID-19 declaration under Section 564(b)(1) of the Act, 21 U.S.C. section 360bbb-3(b)(1), unless the authorization is terminated or revoked.  Performed at [facility], [place] [address], [place], [place] [address]    Time spent: 14min  SIGNED:   Marylu Lund, MD  [facility] 06/02/2021, 2:09 PM  If 7PM-7AM, please contact night-coverage

## 2021-06-02 NOTE — TOC Initial Note (Signed)
Transition of Care [facility]) - Initial/Assessment Note    Patient Details  Name: Maddox Maddox MRN: [id number] Date of Birth: 1950-06-26  [facility] CM/SW Contact:    Maddox Maddox, Maddox Skiff, RN Phone Number: 06/02/2021, 1:35 PM  Clinical Narrative:                   Expected Discharge Plan: [place] Barriers to Discharge: Continued Medical Work up   Patient Goals and CMS Choice Patient states their goals for this hospitalization and ongoing recovery are:: To get home CMS Medicare.gov Compare Post Acute Care list provided to:: Patient Represenative (must comment) (Wife) Choice offered to / list presented to : Spouse  Expected Discharge Plan and Services Expected Discharge Plan: [place]   Discharge Planning Services: CM Consult Post Acute Care Choice: [place] arrangements for the past 2 months: Single Family Home                 DME Arranged: 3-N-1 DME Agency: [employer] Date DME Agency Contacted: 06/02/21 Time DME Agency Contacted: [phone] Representative spoke with at DME Agency: Andee Poles HH Arranged: PT, OT [place] Agency: [place] Date [place]: 06/02/21 Time [place]: 30 Representative spoke with at [place]: [place] Arrangements/Services Living arrangements for the past 2 months: [place] Lives with:: Spouse Patient language and need for interpreter reviewed:: Yes Do you feel safe going back to the place where you live?: Yes      Need for Family Participation in Patient Care: Yes (Comment) Care giver support system in place?: Yes (comment)   Criminal Activity/Legal Involvement Pertinent to Current Situation/Hospitalization: No - Comment as needed  Activities of Daily Living Home Assistive Devices/Equipment: Cane (specify quad or straight) ADL Screening (condition at time of admission) Patient's cognitive ability adequate to safely complete daily  activities?: Yes Is the patient deaf or have difficulty hearing?: No Does the patient have difficulty seeing, even when wearing glasses/contacts?: No Does the patient have difficulty concentrating, remembering, or making decisions?: Yes Patient able to express need for assistance with ADLs?: Yes Does the patient have difficulty dressing or bathing?: Yes Independently performs ADLs?: No Does the patient have difficulty walking or climbing stairs?: Yes Weakness of Legs: Both Weakness of Arms/Hands: Both  Permission Sought/Granted Permission sought to share information with : Facility Art therapist granted to share information with : Yes, Verbal Permission Granted     Permission granted to share info w AGENCY: [facility]        Emotional Assessment Appearance:: Appears stated age Attitude/Demeanor/Rapport: Gracious Affect (typically observed): Calm Orientation: : Oriented to Self, Oriented to Place, Oriented to  Time, Oriented to Situation Alcohol / Substance Use: Not Applicable Psych Involvement: No (comment)  Admission diagnosis:  Hyponatremia [E87.1] Physical deconditioning [R53.81] Patient Active Problem List   Diagnosis Date Noted   Hyponatremia 05/31/2021   Moderate protein malnutrition ([place]) 05/31/2021   Transaminitis 05/31/2021   Hypomagnesemia 05/31/2021   Radiculopathy 02/23/2018   Memory deficit 06/25/2017   Chronic back pain 06/25/2017   Type 2 diabetes mellitus without complication, without long-term current use of insulin ([place]) 10/05/2016   Elevated LDL cholesterol level 10/05/2016   Chronic hepatitis C virus infection ([place]) 12/24/2006   ABUSE, ALCOHOL, IN REMISSION 12/24/2006   Essential hypertension 12/21/2006   GERD 12/21/2006   PCP:  Maddox Bale, PA-C Pharmacy:   [facility] [phone] - Maddox Maddox, Maddox Maddox - [place]  [address] AT [place] [address] Phone: [phone] Fax:  [phone]  [place], [place] - [address] [place] [address] Phone: [phone] Fax: [phone]     Social Determinants of Health ([place]) Interventions    Readmission Risk Interventions No flowsheet data found.

## 2021-06-02 NOTE — Evaluation (Signed)
Physical Therapy Evaluation Patient Details Name: Gary Maddox MRN: [id number] DOB: [birth year] Today's Date: 06/02/2021  History of Present Illness  71 y.o. male with PMH: L5-S1 fusion, alcohol abuse, chronic back pain, type II DM, without complication, hyperlipidemia, hypertension, GERD, chronic hepatitis C, HTN, foot and knee surgeries who was asked by his physician to come to the emergency department due to hyponatremia  Clinical Impression  Pt admitted with above diagnosis.  Pt and wife describe recent functional decline. Pt with effortful gait, multiple deviations as noted below. Pt will benefit from HHPT and HHOT at d/c   Pt currently with functional limitations due to the deficits listed below (see PT Problem List). Pt will benefit from skilled PT to increase their independence and safety with mobility to allow discharge to the venue listed below.          Recommendations for follow up therapy are one component of a multi-disciplinary discharge planning process, led by the attending physician.  Recommendations may be updated based on patient status, additional functional criteria and insurance authorization.  Follow Up Recommendations Home health PT (and HHOT)    Assistance Recommended at Discharge Intermittent Supervision/Assistance  Patient can return home with the following       Equipment Recommendations None recommended by PT  Recommendations for Other Services       Functional Status Assessment Patient has had a recent decline in their functional status and demonstrates the ability to make significant improvements in function in a reasonable and predictable amount of time.     Precautions / Restrictions Precautions Precautions: Fall Restrictions Weight Bearing Restrictions: No      Mobility  Bed Mobility Overal bed mobility: Needs Assistance Bed Mobility: Supine to Sit     Supine to sit: Supervision;Min guard     General bed mobility comments: for lines  and safety, min/guard to elevate trunk, incr time    Transfers Overall transfer level: Needs assistance Equipment used: Rolling walker (2 wheels) Transfers: Sit to/from Stand Sit to Stand: Min guard           General transfer comment: cues for hand placement    Ambulation/Gait Ambulation/Gait assistance: Min assist;Min guard Gait Distance (Feet): 180 Feet Assistive device: Rolling walker (2 wheels) Gait Pattern/deviations: Step-through pattern;Decreased dorsiflexion - right;Decreased dorsiflexion - left;Knee flexed in stance - left;Knee flexed in stance - right;Narrow base of support;Drifts right/left;Trunk flexed;Decreased stride length Gait velocity: decr     General Gait Details: pt unsteady however without overt LOB. deviations as noted above. effortful gait  Stairs            Wheelchair Mobility    Modified Rankin (Stroke Patients Only)       Balance Overall balance assessment: Needs assistance Sitting-balance support: No upper extremity supported;Feet supported Sitting balance-Leahy Scale: Good Sitting balance - Comments: able to initiate donning  LB garment, min assist to complete. no LOB in sitting   Standing balance support: Reliant on assistive device for balance;During functional activity Standing balance-Leahy Scale: Poor                               Pertinent Vitals/Pain      Home Living Family/patient expects to be discharged to:: Private residence Living Arrangements: Spouse/significant other Available Help at Discharge: Family;Available PRN/intermittently Type of Home: House Home Access: Level entry       Home Layout: One level Home Equipment: Rollator (4 wheels);Cane - single point  Prior Function Prior Level of Function : Independent/Modified Independent;Needs assist       Physical Assist : ADLs (physical)   ADLs (physical): Dressing Mobility Comments: amb with rollator, needing some assist recently per  wife ADLs Comments: assist with LB dressing, showers for a few mos     Hand Dominance        Extremity/Trunk Assessment   Upper Extremity Assessment Upper Extremity Assessment: Generalized weakness    Lower Extremity Assessment Lower Extremity Assessment: Generalized weakness       Communication   Communication: No difficulties  Cognition Arousal/Alertness: Awake/alert Behavior During Therapy: WFL for tasks assessed/performed Overall Cognitive Status: Within Functional Limits for tasks assessed                                          General Comments      Exercises     Assessment/Plan    PT Assessment Patient needs continued PT services  PT Problem List Decreased strength;Decreased mobility;Decreased activity tolerance;Decreased balance;Decreased coordination       PT Treatment Interventions DME instruction;Therapeutic activities;Gait training;Therapeutic exercise;Patient/family education;Functional mobility training;Balance training    PT Goals (Current goals can be found in the Care Plan section)  Acute Rehab PT Goals Patient Stated Goal: home soon, get stronger PT Goal Formulation: With patient Time For Goal Achievement: 06/09/21    Frequency Min 3X/week     Co-evaluation               AM-PAC PT "6 Clicks" Mobility  Outcome Measure Help needed turning from your back to your side while in a flat bed without using bedrails?: A Little Help needed moving from lying on your back to sitting on the side of a flat bed without using bedrails?: A Little Help needed moving to and from a bed to a chair (including a wheelchair)?: A Little Help needed standing up from a chair using your arms (e.g., wheelchair or bedside chair)?: A Little Help needed to walk in hospital room?: A Little Help needed climbing 3-5 steps with a railing? : A Little 6 Click Score: 18    End of Session Equipment Utilized During Treatment: Gait belt Activity  Tolerance: Patient tolerated treatment well Patient left: in bed;with call bell/phone within reach;with family/visitor present Nurse Communication: Mobility status PT Visit Diagnosis: Other abnormalities of gait and mobility (R26.89);Muscle weakness (generalized) (M62.81)    Time: 2355-7322 PT Time Calculation (min) (ACUTE ONLY): 26 min   Charges:   PT Evaluation $PT Eval Low Complexity: 1 Low PT Treatments $Gait Training: 8-22 mins        Baxter Flattery, PT  Acute Rehab Dept ([employer]) [phone] Pager [phone]  06/02/2021   [facility] 06/02/2021, 12:12 PM

## 2021-08-29 ENCOUNTER — Inpatient Hospital Stay (HOSPITAL_COMMUNITY)
Admission: EM | Admit: 2021-08-29 | Discharge: 2021-09-07 | DRG: 436 | Disposition: A | Discharge status: Hospice | Payer: Medicare Other | Attending: Internal Medicine | Admitting: Internal Medicine

## 2021-08-29 ENCOUNTER — Emergency Department (HOSPITAL_COMMUNITY): Payer: Medicare Other

## 2021-08-29 ENCOUNTER — Encounter (HOSPITAL_COMMUNITY): Payer: Self-pay

## 2021-08-29 DIAGNOSIS — R7401 Elevation of levels of liver transaminase levels: Secondary | ICD-10-CM | POA: Diagnosis present

## 2021-08-29 DIAGNOSIS — I82422 Acute embolism and thrombosis of left iliac vein: Secondary | ICD-10-CM | POA: Diagnosis present

## 2021-08-29 DIAGNOSIS — R188 Other ascites: Secondary | ICD-10-CM

## 2021-08-29 DIAGNOSIS — R7989 Other specified abnormal findings of blood chemistry: Secondary | ICD-10-CM | POA: Diagnosis not present

## 2021-08-29 DIAGNOSIS — I82461 Acute embolism and thrombosis of right calf muscular vein: Secondary | ICD-10-CM | POA: Diagnosis present

## 2021-08-29 DIAGNOSIS — G3184 Mild cognitive impairment, so stated: Secondary | ICD-10-CM | POA: Diagnosis present

## 2021-08-29 DIAGNOSIS — Z79899 Other long term (current) drug therapy: Secondary | ICD-10-CM

## 2021-08-29 DIAGNOSIS — B182 Chronic viral hepatitis C: Secondary | ICD-10-CM | POA: Diagnosis present

## 2021-08-29 DIAGNOSIS — Z87891 Personal history of nicotine dependence: Secondary | ICD-10-CM

## 2021-08-29 DIAGNOSIS — K219 Gastro-esophageal reflux disease without esophagitis: Secondary | ICD-10-CM | POA: Diagnosis present

## 2021-08-29 DIAGNOSIS — I82411 Acute embolism and thrombosis of right femoral vein: Secondary | ICD-10-CM | POA: Diagnosis present

## 2021-08-29 DIAGNOSIS — R140 Abdominal distension (gaseous): Secondary | ICD-10-CM | POA: Diagnosis not present

## 2021-08-29 DIAGNOSIS — I82441 Acute embolism and thrombosis of right tibial vein: Secondary | ICD-10-CM | POA: Diagnosis present

## 2021-08-29 DIAGNOSIS — E871 Hypo-osmolality and hyponatremia: Secondary | ICD-10-CM | POA: Diagnosis present

## 2021-08-29 DIAGNOSIS — Z7189 Other specified counseling: Secondary | ICD-10-CM

## 2021-08-29 DIAGNOSIS — Z8249 Family history of ischemic heart disease and other diseases of the circulatory system: Secondary | ICD-10-CM

## 2021-08-29 DIAGNOSIS — Z6820 Body mass index (BMI) 20.0-20.9, adult: Secondary | ICD-10-CM

## 2021-08-29 DIAGNOSIS — R54 Age-related physical debility: Secondary | ICD-10-CM | POA: Diagnosis present

## 2021-08-29 DIAGNOSIS — E440 Moderate protein-calorie malnutrition: Secondary | ICD-10-CM | POA: Diagnosis present

## 2021-08-29 DIAGNOSIS — R400 Somnolence: Secondary | ICD-10-CM | POA: Diagnosis not present

## 2021-08-29 DIAGNOSIS — I82403 Acute embolism and thrombosis of unspecified deep veins of lower extremity, bilateral: Secondary | ICD-10-CM

## 2021-08-29 DIAGNOSIS — C220 Liver cell carcinoma: Secondary | ICD-10-CM | POA: Diagnosis not present

## 2021-08-29 DIAGNOSIS — K766 Portal hypertension: Secondary | ICD-10-CM | POA: Diagnosis present

## 2021-08-29 DIAGNOSIS — M7989 Other specified soft tissue disorders: Secondary | ICD-10-CM | POA: Diagnosis present

## 2021-08-29 DIAGNOSIS — E8770 Fluid overload, unspecified: Secondary | ICD-10-CM | POA: Diagnosis present

## 2021-08-29 DIAGNOSIS — E119 Type 2 diabetes mellitus without complications: Secondary | ICD-10-CM | POA: Diagnosis present

## 2021-08-29 DIAGNOSIS — Z981 Arthrodesis status: Secondary | ICD-10-CM

## 2021-08-29 DIAGNOSIS — D539 Nutritional anemia, unspecified: Secondary | ICD-10-CM | POA: Diagnosis present

## 2021-08-29 DIAGNOSIS — Z515 Encounter for palliative care: Secondary | ICD-10-CM

## 2021-08-29 DIAGNOSIS — R64 Cachexia: Secondary | ICD-10-CM | POA: Diagnosis present

## 2021-08-29 DIAGNOSIS — K7031 Alcoholic cirrhosis of liver with ascites: Secondary | ICD-10-CM | POA: Diagnosis present

## 2021-08-29 DIAGNOSIS — F1011 Alcohol abuse, in remission: Secondary | ICD-10-CM | POA: Diagnosis present

## 2021-08-29 DIAGNOSIS — I824Y1 Acute embolism and thrombosis of unspecified deep veins of right proximal lower extremity: Secondary | ICD-10-CM | POA: Diagnosis present

## 2021-08-29 DIAGNOSIS — R160 Hepatomegaly, not elsewhere classified: Secondary | ICD-10-CM

## 2021-08-29 DIAGNOSIS — E785 Hyperlipidemia, unspecified: Secondary | ICD-10-CM | POA: Diagnosis present

## 2021-08-29 DIAGNOSIS — I10 Essential (primary) hypertension: Secondary | ICD-10-CM | POA: Diagnosis present

## 2021-08-29 DIAGNOSIS — C7971 Secondary malignant neoplasm of right adrenal gland: Secondary | ICD-10-CM | POA: Diagnosis present

## 2021-08-29 DIAGNOSIS — I82492 Acute embolism and thrombosis of other specified deep vein of left lower extremity: Secondary | ICD-10-CM | POA: Diagnosis present

## 2021-08-29 LAB — HEPATIC FUNCTION PANEL
ALT: 36 U/L (ref 0–44)
AST: 64 U/L — ABNORMAL HIGH (ref 15–41)
Albumin: 2.2 g/dL — ABNORMAL LOW (ref 3.5–5.0)
Alkaline Phosphatase: 199 U/L — ABNORMAL HIGH (ref 38–126)
Bilirubin, Direct: 0.3 mg/dL — ABNORMAL HIGH (ref 0.0–0.2)
Indirect Bilirubin: 0.7 mg/dL (ref 0.3–0.9)
Total Bilirubin: 1 mg/dL (ref 0.3–1.2)
Total Protein: 6.5 g/dL (ref 6.5–8.1)

## 2021-08-29 LAB — COMPREHENSIVE METABOLIC PANEL
ALT: 39 U/L (ref 0–44)
AST: 72 U/L — ABNORMAL HIGH (ref 15–41)
Albumin: 2.4 g/dL — ABNORMAL LOW (ref 3.5–5.0)
Alkaline Phosphatase: 215 U/L — ABNORMAL HIGH (ref 38–126)
Anion gap: 7 (ref 5–15)
BUN: 10 mg/dL (ref 8–23)
CO2: 27 mmol/L (ref 22–32)
Calcium: 8.5 mg/dL — ABNORMAL LOW (ref 8.9–10.3)
Chloride: 96 mmol/L — ABNORMAL LOW (ref 98–111)
Creatinine, Ser: 0.59 mg/dL — ABNORMAL LOW (ref 0.61–1.24)
GFR, Estimated: >60 mL/min (ref >60)
Glucose, Bld: 102 mg/dL — ABNORMAL HIGH (ref 70–99)
Potassium: 4 mmol/L (ref 3.5–5.1)
Sodium: 130 mmol/L — ABNORMAL LOW (ref 135–145)
Total Bilirubin: 1 mg/dL (ref 0.3–1.2)
Total Protein: 6.8 g/dL (ref 6.5–8.1)

## 2021-08-29 LAB — CBC
HCT: 37.9 % — ABNORMAL LOW (ref 39.0–52.0)
Hemoglobin: 12.7 g/dL — ABNORMAL LOW (ref 13.0–17.0)
MCH: 33.8 pg (ref 26.0–34.0)
MCHC: 33.5 g/dL (ref 30.0–36.0)
MCV: 100.8 fL — ABNORMAL HIGH (ref 80.0–100.0)
Platelets: 319 10*3/uL (ref 150–400)
RBC: 3.76 MIL/uL — ABNORMAL LOW (ref 4.22–5.81)
RDW: 13 % (ref 11.5–15.5)
WBC: 7.2 10*3/uL (ref 4.0–10.5)
nRBC: 0 % (ref 0.0–0.2)

## 2021-08-29 LAB — BRAIN NATRIURETIC PEPTIDE: B Natriuretic Peptide: 82.3 pg/mL (ref 0.0–100.0)

## 2021-08-29 LAB — PROTIME-INR
INR: 1.1 (ref 0.8–1.2)
Prothrombin Time: 13.6 seconds (ref 11.4–15.2)

## 2021-08-29 LAB — LIPASE, BLOOD: Lipase: 34 U/L (ref 11–51)

## 2021-08-29 MED ORDER — IOHEXOL 300 MG/ML  SOLN
100.0000 mL | Freq: Once | INTRAMUSCULAR | Status: AC | PRN
  Administered 2021-08-29: 100 mL via INTRAVENOUS

## 2021-08-29 NOTE — ED Provider Notes (Signed)
Pt care assumed at 2330. ? ?Patient with history of hep C here for evaluation of progressive abdominal bloating.  Patient care assumed pending CT abdomen pelvis.  CT abdomen pelvis demonstrates hepatic mass worrisome for hepatocellular carcinoma as well as large volume ascites.  Patient does not have a gastroenterologist.  It does appear that he had an ultrasound of his abdomen performed in another hospital system in 2020 that demonstrated a much smaller mass.  It is unclear what kind of follow-up he had for that imaging at that time.  Given new onset ascites, concern for hepatocellular carcinoma plan to admit for ongoing work-up.  Patient updated of findings of studies and recommended treatment plan.  Patient is in agreement with plan at this time.  Hospitalist consulted for admission for ongoing care. ?  ?Quintella Reichert, MD ?08/30/21 [phone] ? ?

## 2021-08-29 NOTE — ED Provider Notes (Signed)
?[facility] DEPT ?Provider Note ? ? ?CSN: [id number] ?Arrival date & time: 08/29/21  9211 ? ?  ? ?History ? ?No chief complaint on file. ? ? ?Gary Maddox is a 71 y.o. male. ? ? Patient as above with significant medical history as below, including chronic alcohol abuse, DM, chronic hepatitis C who presents to the ED with complaint of abdominal swelling.  Leg swelling, fatigue, poor appetite.  Weight loss.  Accompanied by family.  Reports patient has been progressively more fatigued over the past 2 weeks and worsening distention in his abdomen.  Worsening leg swelling.  Poor p.o. intake. ? ?No recent medication changes.  Former alcohol and tobacco abuse.  No longer drinking alcohol. ? ?No fevers, chills, chest pain, dyspnea, nausea or vomiting.  No change in bowel or bladder function.  No melena or BRBPR.  Denies trauma. ? ? ?Past Medical History: ?No date: Alcohol abuse ?No date: Back pain ?No date: Diabetes mellitus without complication ([place]) ?94/17/4081: Elevated LDL cholesterol level ?12/21/2006: Essential hypertension ?    Comment:  Qualifier: Diagnosis of  By: Leward Quan MD, Pamala Hurry   ?12/21/2006: GERD ?    Comment:  Qualifier: Diagnosis of  By: Leward Quan MD, Pamala Hurry   ?12/24/2006: HEPATITIS C, CHRONIC VIRAL, W/O HEPATIC COMA ?    Comment:  Qualifier: Diagnosis of  By: Leward Quan MD, Pamala Hurry   ? ?Past Surgical History: ?02/23/2018: ABDOMINAL EXPOSURE; N/A ?    Comment:  Procedure: ABDOMINAL EXPOSURE;  Surgeon: Rosetta Posner,  ?             MD;  Location: [facility];  Service: Vascular;  Laterality:  ?             N/A; ?02/23/2018: ANTERIOR LUMBAR FUSION; N/A ?    Comment:  Procedure: ANTERIOR LUMBAR INTERBODY FUSION LUMBAR 5 -  ?             SACRUM 1;  Surgeon: Phylliss Bob, MD;  Location: [place]; ?             Service: Orthopedics;  Laterality: N/A; ?No date: BACK SURGERY ?No date: FOOT SURGERY ?No date: KNEE SURGERY  ? ? ?The history is provided by the patient and the spouse. No  language interpreter was used.  ? ?  ? ?Home Medications ?Prior to Admission medications   ?Medication Sig Start Date End Date Taking? Authorizing Provider  ?carvedilol (COREG) 12.5 MG tablet TAKE 1 TABLET(12.5 MG) BY MOUTH TWICE DAILY WITH A MEAL ?Patient taking differently: Take 12.5 mg by mouth 2 (two) times daily. 01/04/18   Gale Journey, Damaris Hippo, PA-C  ?diazepam (VALIUM) 5 MG tablet Take 5 mg by mouth daily as needed (for MRI). 12/20/20   [provider]  ?gabapentin (NEURONTIN) 300 MG capsule Take 300 mg by mouth 2 (two) times daily.  11/12/17   [provider]  ?hydrocortisone (ANUSOL-HC) 2.5 % rectal cream Place 1 application rectally 2 (two) times daily. 03/28/21   [provider]  ?meloxicam (MOBIC) 15 MG tablet Take 15 mg by mouth daily as needed for pain.    [provider]  ?methocarbamol (ROBAXIN) 500 MG tablet Take 500 mg by mouth 3 (three) times daily. Once a day 04/20/19   [provider]  ?silver sulfADIAZINE (SILVADENE) 1 % cream Apply 1 application topically daily. 05/01/21   [provider]  ?sodium chloride (OCEAN) 0.65 % SOLN nasal spray Place 1 spray into both nostrils daily.    [provider]  ?tetrahydrozoline  0.05 % ophthalmic solution Place 2 drops into both eyes daily as needed (dry, red eys).    [provider]  ?traMADol (ULTRAM) 50 MG tablet Take 50 mg by mouth daily as needed for moderate pain.    [provider]  ?   ? ?Allergies    ?Patient has no known allergies.   ? ?Review of Systems   ?Review of Systems  ?Constitutional:  Positive for appetite change, fatigue and unexpected weight change. Negative for chills and fever.  ?HENT:  Negative for facial swelling and trouble swallowing.   ?Eyes:  Negative for photophobia and visual disturbance.  ?Respiratory:  Negative for cough and shortness of breath.   ?Cardiovascular:  Negative for chest pain and palpitations.  ?Gastrointestinal:  Positive for abdominal  distention and abdominal pain. Negative for nausea and vomiting.  ?Endocrine: Negative for polydipsia and polyuria.  ?Genitourinary:  Negative for difficulty urinating and hematuria.  ?Musculoskeletal:  Negative for gait problem and joint swelling.  ?Skin:  Negative for pallor and rash.  ?Neurological:  Negative for syncope and headaches.  ?Psychiatric/Behavioral:  Negative for agitation and confusion.   ? ?Physical Exam ?Updated Vital Signs ?BP 126/85 (BP Location: Right Arm)   Pulse 71   Temp 97.9 ?F (36.6 ?C) (Oral)   Resp 19   SpO2 99%  ?Physical Exam ?Vitals and nursing note reviewed.  ?Constitutional:   ?   General: He is not in acute distress. ?   Appearance: He is well-developed. He is not ill-appearing or diaphoretic.  ?   Comments: Frail appearing  ?HENT:  ?   Head: Normocephalic and atraumatic.  ?   Right Ear: External ear normal.  ?   Left Ear: External ear normal.  ?   Mouth/Throat:  ?   Mouth: Mucous membranes are moist.  ?Eyes:  ?   General: No scleral icterus. ?Cardiovascular:  ?   Rate and Rhythm: Normal rate and regular rhythm.  ?   Pulses: Normal pulses.  ?   Heart sounds: Normal heart sounds.  ?Pulmonary:  ?   Effort: Pulmonary effort is normal. No respiratory distress.  ?   Breath sounds: Normal breath sounds. No stridor. No wheezing.  ?Abdominal:  ?   General: Abdomen is flat. There is distension.  ?   Palpations: Abdomen is soft.  ?   Tenderness: There is no abdominal tenderness. There is no guarding or rebound.  ?Musculoskeletal:     ?   General: Normal range of motion.  ?   Cervical back: Normal range of motion and neck supple.  ?   Right lower leg: Edema present.  ?   Left lower leg: Edema present.  ?   Comments: Pitting edema to mid thigh  ?Skin: ?   General: Skin is warm and dry.  ?   Capillary Refill: Capillary refill takes less than 2 seconds.  ?Neurological:  ?   Mental Status: He is alert and oriented to person, place, and time.  ?Psychiatric:     ?   Mood and Affect: Mood normal.      ?   Behavior: Behavior normal.  ? ? ?ED Results / Procedures / Treatments   ?Labs ?(all labs ordered are listed, but only abnormal results are displayed) ?Labs Reviewed  ?COMPREHENSIVE METABOLIC PANEL - Abnormal; Notable for the following components:  ?    Result Value  ? Sodium 130 (*)   ? Chloride 96 (*)   ? Glucose, Bld 102 (*)   ?  Creatinine, Ser 0.59 (*)   ? Calcium 8.5 (*)   ? Albumin 2.4 (*)   ? AST 72 (*)   ? Alkaline Phosphatase 215 (*)   ? All other components within normal limits  ?CBC - Abnormal; Notable for the following components:  ? RBC 3.76 (*)   ? Hemoglobin 12.7 (*)   ? HCT 37.9 (*)   ? MCV 100.8 (*)   ? All other components within normal limits  ?HEPATIC FUNCTION PANEL - Abnormal; Notable for the following components:  ? Albumin 2.2 (*)   ? AST 64 (*)   ? Alkaline Phosphatase 199 (*)   ? Bilirubin, Direct 0.3 (*)   ? All other components within normal limits  ?LIPASE, BLOOD  ?PROTIME-INR  ?AMMONIA  ?BRAIN NATRIURETIC PEPTIDE  ?URINALYSIS, ROUTINE W REFLEX MICROSCOPIC  ? ? ?EKG ?None ? ?Radiology ?No results found. ? ?Procedures ?Procedures  ? ? ?Medications Ordered in ED ?Medications - No data to display ? ?ED Course/ Medical Decision Making/ A&P ?  ?                        ?Medical Decision Making ?Amount and/or Complexity of Data Reviewed ?Labs: ordered. ?Radiology: ordered. ? ?Risk ?Prescription drug management. ? ? ? ?CC: Abdominal distention, swelling ? ?This patient presents to the Emergency Department for the above complaint. This involves an extensive number of treatment options and is a complaint that carries with it a high risk of complications and morbidity. Vital signs were reviewed. Serious etiologies considered. ? ?Record review:  ?Previous records obtained and reviewed  ? ?Additional history obtained from spouse/partner ? ?Medical and surgical history as noted above.  ? ?Work up as above, notable for:  ? ?Labs & imaging results that were available during my care of the patient  were visualized by me and considered in my medical decision making. ?  ?I ordered imaging studies which included CT abdomen pelvis and I visualized the imaging and I agree with radiologist interpretation.   ? ?Card

## 2021-08-29 NOTE — ED Triage Notes (Signed)
Pt arrived via POV, c/o abd distention. Denies any abd pain, n/v.  ?

## 2021-08-30 ENCOUNTER — Inpatient Hospital Stay (HOSPITAL_COMMUNITY): Payer: Medicare Other

## 2021-08-30 ENCOUNTER — Other Ambulatory Visit: Payer: Self-pay

## 2021-08-30 ENCOUNTER — Encounter (HOSPITAL_COMMUNITY): Payer: Self-pay | Admitting: Emergency Medicine

## 2021-08-30 DIAGNOSIS — M7989 Other specified soft tissue disorders: Secondary | ICD-10-CM | POA: Diagnosis not present

## 2021-08-30 DIAGNOSIS — E785 Hyperlipidemia, unspecified: Secondary | ICD-10-CM

## 2021-08-30 DIAGNOSIS — G3184 Mild cognitive impairment, so stated: Secondary | ICD-10-CM | POA: Diagnosis present

## 2021-08-30 DIAGNOSIS — R140 Abdominal distension (gaseous): Secondary | ICD-10-CM

## 2021-08-30 DIAGNOSIS — D539 Nutritional anemia, unspecified: Secondary | ICD-10-CM | POA: Diagnosis present

## 2021-08-30 DIAGNOSIS — I82422 Acute embolism and thrombosis of left iliac vein: Secondary | ICD-10-CM | POA: Diagnosis present

## 2021-08-30 DIAGNOSIS — I82411 Acute embolism and thrombosis of right femoral vein: Secondary | ICD-10-CM | POA: Diagnosis present

## 2021-08-30 DIAGNOSIS — R188 Other ascites: Secondary | ICD-10-CM | POA: Diagnosis present

## 2021-08-30 DIAGNOSIS — E871 Hypo-osmolality and hyponatremia: Secondary | ICD-10-CM | POA: Diagnosis present

## 2021-08-30 DIAGNOSIS — I82461 Acute embolism and thrombosis of right calf muscular vein: Secondary | ICD-10-CM | POA: Diagnosis present

## 2021-08-30 DIAGNOSIS — B182 Chronic viral hepatitis C: Secondary | ICD-10-CM | POA: Diagnosis present

## 2021-08-30 DIAGNOSIS — F1011 Alcohol abuse, in remission: Secondary | ICD-10-CM | POA: Diagnosis present

## 2021-08-30 DIAGNOSIS — K7031 Alcoholic cirrhosis of liver with ascites: Secondary | ICD-10-CM | POA: Diagnosis present

## 2021-08-30 DIAGNOSIS — R64 Cachexia: Secondary | ICD-10-CM | POA: Diagnosis present

## 2021-08-30 DIAGNOSIS — Z515 Encounter for palliative care: Secondary | ICD-10-CM | POA: Diagnosis not present

## 2021-08-30 DIAGNOSIS — K219 Gastro-esophageal reflux disease without esophagitis: Secondary | ICD-10-CM | POA: Diagnosis present

## 2021-08-30 DIAGNOSIS — I824Y1 Acute embolism and thrombosis of unspecified deep veins of right proximal lower extremity: Secondary | ICD-10-CM | POA: Diagnosis present

## 2021-08-30 DIAGNOSIS — E119 Type 2 diabetes mellitus without complications: Secondary | ICD-10-CM | POA: Diagnosis present

## 2021-08-30 DIAGNOSIS — E440 Moderate protein-calorie malnutrition: Secondary | ICD-10-CM | POA: Diagnosis present

## 2021-08-30 DIAGNOSIS — Z7189 Other specified counseling: Secondary | ICD-10-CM | POA: Diagnosis not present

## 2021-08-30 DIAGNOSIS — R54 Age-related physical debility: Secondary | ICD-10-CM | POA: Diagnosis present

## 2021-08-30 DIAGNOSIS — C7971 Secondary malignant neoplasm of right adrenal gland: Secondary | ICD-10-CM | POA: Diagnosis present

## 2021-08-30 DIAGNOSIS — I82492 Acute embolism and thrombosis of other specified deep vein of left lower extremity: Secondary | ICD-10-CM | POA: Diagnosis present

## 2021-08-30 DIAGNOSIS — I82441 Acute embolism and thrombosis of right tibial vein: Secondary | ICD-10-CM | POA: Diagnosis present

## 2021-08-30 DIAGNOSIS — E8770 Fluid overload, unspecified: Secondary | ICD-10-CM | POA: Diagnosis present

## 2021-08-30 DIAGNOSIS — K766 Portal hypertension: Secondary | ICD-10-CM | POA: Diagnosis present

## 2021-08-30 DIAGNOSIS — I10 Essential (primary) hypertension: Secondary | ICD-10-CM | POA: Diagnosis present

## 2021-08-30 DIAGNOSIS — R160 Hepatomegaly, not elsewhere classified: Secondary | ICD-10-CM

## 2021-08-30 DIAGNOSIS — C220 Liver cell carcinoma: Secondary | ICD-10-CM | POA: Diagnosis present

## 2021-08-30 LAB — CBC WITH DIFFERENTIAL/PLATELET
Abs Immature Granulocytes: 0.01 10*3/uL (ref 0.00–0.07)
Basophils Absolute: 0 10*3/uL (ref 0.0–0.1)
Basophils Relative: 0 %
Eosinophils Absolute: 0.1 10*3/uL (ref 0.0–0.5)
Eosinophils Relative: 1 %
HCT: 40 % (ref 39.0–52.0)
Hemoglobin: 13.6 g/dL (ref 13.0–17.0)
Immature Granulocytes: 0 %
Lymphocytes Relative: 26 %
Lymphs Abs: 1.9 10*3/uL (ref 0.7–4.0)
MCH: 34 pg (ref 26.0–34.0)
MCHC: 34 g/dL (ref 30.0–36.0)
MCV: 100 fL (ref 80.0–100.0)
Monocytes Absolute: 0.8 10*3/uL (ref 0.1–1.0)
Monocytes Relative: 11 %
Neutro Abs: 4.5 10*3/uL (ref 1.7–7.7)
Neutrophils Relative %: 62 %
Platelets: 364 10*3/uL (ref 150–400)
RBC: 4 MIL/uL — ABNORMAL LOW (ref 4.22–5.81)
RDW: 12.9 % (ref 11.5–15.5)
WBC: 7.3 10*3/uL (ref 4.0–10.5)
nRBC: 0 % (ref 0.0–0.2)

## 2021-08-30 LAB — URINALYSIS, ROUTINE W REFLEX MICROSCOPIC
Bacteria, UA: NONE SEEN
Bilirubin Urine: NEGATIVE
Glucose, UA: NEGATIVE mg/dL
Hgb urine dipstick: NEGATIVE
Ketones, ur: NEGATIVE mg/dL
Leukocytes,Ua: NEGATIVE
Nitrite: NEGATIVE
Protein, ur: 30 mg/dL — AB
Specific Gravity, Urine: >1.046 — ABNORMAL HIGH (ref 1.005–1.030)
pH: 6 (ref 5.0–8.0)

## 2021-08-30 LAB — GLUCOSE, PLEURAL OR PERITONEAL FLUID: Glucose, Fluid: 90 mg/dL

## 2021-08-30 LAB — PROTEIN, PLEURAL OR PERITONEAL FLUID: Total protein, fluid: <3 g/dL

## 2021-08-30 LAB — AMMONIA: Ammonia: 31 umol/L (ref 9–35)

## 2021-08-30 LAB — COMPREHENSIVE METABOLIC PANEL
ALT: 35 U/L (ref 0–44)
AST: 62 U/L — ABNORMAL HIGH (ref 15–41)
Albumin: 2.3 g/dL — ABNORMAL LOW (ref 3.5–5.0)
Alkaline Phosphatase: 196 U/L — ABNORMAL HIGH (ref 38–126)
Anion gap: 7 (ref 5–15)
BUN: 13 mg/dL (ref 8–23)
CO2: 30 mmol/L (ref 22–32)
Calcium: 8.5 mg/dL — ABNORMAL LOW (ref 8.9–10.3)
Chloride: 97 mmol/L — ABNORMAL LOW (ref 98–111)
Creatinine, Ser: 0.71 mg/dL (ref 0.61–1.24)
GFR, Estimated: >60 mL/min (ref >60)
Glucose, Bld: 80 mg/dL (ref 70–99)
Potassium: 4.2 mmol/L (ref 3.5–5.1)
Sodium: 134 mmol/L — ABNORMAL LOW (ref 135–145)
Total Bilirubin: 1.1 mg/dL (ref 0.3–1.2)
Total Protein: 6.4 g/dL — ABNORMAL LOW (ref 6.5–8.1)

## 2021-08-30 LAB — HEMOGLOBIN A1C
Hgb A1c MFr Bld: 5.4 % (ref 4.8–5.6)
Mean Plasma Glucose: 108.28 mg/dL

## 2021-08-30 LAB — BODY FLUID CELL COUNT WITH DIFFERENTIAL
Lymphs, Fluid: 33 %
Monocyte-Macrophage-Serous Fluid: 64 % (ref 50–90)
Neutrophil Count, Fluid: 3 % (ref 0–25)
Total Nucleated Cell Count, Fluid: 270 cu mm (ref 0–1000)

## 2021-08-30 LAB — HEPATITIS PANEL, ACUTE
HCV Ab: REACTIVE — AB
Hep A IgM: NONREACTIVE
Hep B C IgM: NONREACTIVE
Hepatitis B Surface Ag: NONREACTIVE

## 2021-08-30 LAB — AMYLASE: Amylase: 58 U/L (ref 28–100)

## 2021-08-30 LAB — MRSA NEXT GEN BY PCR, NASAL: MRSA by PCR Next Gen: NOT DETECTED

## 2021-08-30 LAB — HEPARIN LEVEL (UNFRACTIONATED): Heparin Unfractionated: 0.24 IU/mL — ABNORMAL LOW (ref 0.30–0.70)

## 2021-08-30 LAB — PHOSPHORUS: Phosphorus: 3.9 mg/dL (ref 2.5–4.6)

## 2021-08-30 LAB — ALBUMIN, PLEURAL OR PERITONEAL FLUID: Albumin, Fluid: <1.5 g/dL

## 2021-08-30 MED ORDER — LORAZEPAM 1 MG PO TABS
1.0000 mg | ORAL_TABLET | ORAL | Status: AC | PRN
  Administered 2021-09-02: 2 mg via ORAL
  Filled 2021-08-30: qty 2

## 2021-08-30 MED ORDER — HEPARIN BOLUS VIA INFUSION
2000.0000 [IU] | Freq: Once | INTRAVENOUS | Status: AC
  Administered 2021-08-30: 2000 [IU] via INTRAVENOUS
  Filled 2021-08-30: qty 2000

## 2021-08-30 MED ORDER — PANTOPRAZOLE SODIUM 40 MG PO TBEC
40.0000 mg | DELAYED_RELEASE_TABLET | Freq: Every day | ORAL | Status: DC
Start: 2021-08-30 — End: 2021-09-07
  Administered 2021-08-30 – 2021-09-07 (×9): 40 mg via ORAL
  Filled 2021-08-30 (×9): qty 1

## 2021-08-30 MED ORDER — CARVEDILOL 12.5 MG PO TABS
12.5000 mg | ORAL_TABLET | Freq: Two times a day (BID) | ORAL | Status: DC
  Administered 2021-08-30 – 2021-09-07 (×17): 12.5 mg via ORAL
  Filled 2021-08-30 (×17): qty 1

## 2021-08-30 MED ORDER — ALBUMIN HUMAN 25 % IV SOLN
12.5000 g | INTRAVENOUS | Status: AC
  Administered 2021-08-30: 12.5 g via INTRAVENOUS
  Filled 2021-08-30: qty 50

## 2021-08-30 MED ORDER — CHLORHEXIDINE GLUCONATE CLOTH 2 % EX PADS
6.0000 | MEDICATED_PAD | Freq: Every day | CUTANEOUS | Status: DC
  Administered 2021-08-30: 6 via TOPICAL

## 2021-08-30 MED ORDER — SALINE SPRAY 0.65 % NA SOLN
1.0000 | Freq: Every day | NASAL | Status: DC
  Administered 2021-09-01 – 2021-09-07 (×6): 1 via NASAL
  Filled 2021-08-30 (×2): qty 44

## 2021-08-30 MED ORDER — HEPARIN (PORCINE) 25000 UT/250ML-% IV SOLN
1150.0000 [IU]/h | INTRAVENOUS | Status: DC
Start: 2021-08-30 — End: 2021-09-04
  Administered 2021-08-30: 1150 [IU]/h via INTRAVENOUS
  Administered 2021-08-31 – 2021-09-01 (×2): 1250 [IU]/h via INTRAVENOUS
  Administered 2021-09-03 – 2021-09-04 (×2): 1000 [IU]/h via INTRAVENOUS
  Filled 2021-08-30 (×8): qty 250

## 2021-08-30 MED ORDER — LORAZEPAM 2 MG/ML IJ SOLN: 1.0000 mg | INTRAMUSCULAR | Status: AC | PRN

## 2021-08-30 MED ORDER — HYDROCORTISONE (PERIANAL) 2.5 % EX CREA
1.0000 "application " | TOPICAL_CREAM | Freq: Two times a day (BID) | CUTANEOUS | Status: DC
  Administered 2021-08-30 – 2021-09-07 (×15): 1 via RECTAL
  Filled 2021-08-30 (×2): qty 28.35

## 2021-08-30 MED ORDER — ADULT MULTIVITAMIN W/MINERALS CH
1.0000 | ORAL_TABLET | Freq: Every day | ORAL | Status: DC
  Administered 2021-08-31 – 2021-09-06 (×7): 1 via ORAL
  Filled 2021-08-30 (×7): qty 1

## 2021-08-30 MED ORDER — FUROSEMIDE 40 MG PO TABS
40.0000 mg | ORAL_TABLET | Freq: Every day | ORAL | Status: DC
  Administered 2021-08-30 – 2021-09-07 (×9): 40 mg via ORAL
  Filled 2021-08-30 (×9): qty 1

## 2021-08-30 MED ORDER — LIDOCAINE HCL 1 % IJ SOLN
INTRAMUSCULAR | Status: AC
  Administered 2021-08-30: 10 mL
  Filled 2021-08-30: qty 20

## 2021-08-30 MED ORDER — SILVER SULFADIAZINE 1 % EX CREA
1.0000 "application " | TOPICAL_CREAM | Freq: Every day | CUTANEOUS | Status: DC
  Administered 2021-08-30 – 2021-09-07 (×8): 1 via TOPICAL
  Filled 2021-08-30 (×2): qty 50

## 2021-08-30 MED ORDER — THIAMINE HCL 100 MG PO TABS
100.0000 mg | ORAL_TABLET | Freq: Every day | ORAL | Status: DC
  Administered 2021-08-30 – 2021-09-06 (×8): 100 mg via ORAL
  Filled 2021-08-30 (×8): qty 1

## 2021-08-30 MED ORDER — THIAMINE HCL 100 MG/ML IJ SOLN: 100.0000 mg | Freq: Every day | INTRAMUSCULAR | Status: DC

## 2021-08-30 MED ORDER — AMLODIPINE BESYLATE 5 MG PO TABS
5.0000 mg | ORAL_TABLET | Freq: Every day | ORAL | Status: DC
Start: 2021-08-30 — End: 2021-09-04
  Administered 2021-08-30 – 2021-09-03 (×5): 5 mg via ORAL
  Filled 2021-08-30 (×5): qty 1

## 2021-08-30 MED ORDER — FOLIC ACID 1 MG PO TABS
1.0000 mg | ORAL_TABLET | Freq: Every day | ORAL | Status: DC
  Administered 2021-08-30 – 2021-09-06 (×8): 1 mg via ORAL
  Filled 2021-08-30 (×8): qty 1

## 2021-08-30 MED ORDER — NAPHAZOLINE-GLYCERIN 0.012-0.25 % OP SOLN
2.0000 [drp] | Freq: Four times a day (QID) | OPHTHALMIC | Status: DC | PRN
  Filled 2021-08-30: qty 15

## 2021-08-30 NOTE — Progress Notes (Signed)
Bilateral lower extremity venous duplex has been completed. ?Preliminary results can be found in CV Proc through chart review.  ?Results were given to the patient's nurse, Jeanette Caprice. ? ?08/30/21 10:21 AM ?Carlos Levering RVT   ?

## 2021-08-30 NOTE — Procedures (Signed)
PROCEDURE SUMMARY: ? ?Successful US guided diagnostic and therapeutic paracentesis from [facility]. ?Yielded 3.8 L of clear, yellow fluid.  ?No immediate complications.  ?Pt tolerated well.  ? ?Specimen was sent for labs. ? ?EBL < 1 mL ? ?Tyson Alias, AGNP ?08/30/2021 ?9:26 AM ?  ?

## 2021-08-30 NOTE — Progress Notes (Signed)
Request was made for liver lesion biopsy. After review, Dr. Annamaria Boots recommends correlating concern for [facility] with AFP. The large size of the liver mass makes biopsy procedure too high risk. Recommendation was discussed with Dr. Marylyn Ishihara who is in agreement.  ? ? ?[name], AGNP-BC ?08/30/2021, 8:42 AM ? ? ?

## 2021-08-30 NOTE — H&P (Addendum)
?History and Physical  ? ? ?Patient: Gary Maddox MRN:[id number] DOB: 06/18/1950 ?DOA: 08/29/2021 ?DOS: the patient was seen and examined on 08/30/2021 ?PCP: Weber, Sarah L, PA-C  ?Patient coming from: Home ? ?Chief Complaint: abdominal distention ? ?HPI: Gary Maddox is a 71 y.o. male with medical history significant of EtOH abuse, cognitive disorder, HTN, DM2, chronic hep C, GERD, HLD. Presenting with abdominal distention. He reports that his stomach has been swelling for the last 2 weeks. He hasn't had any pain, N/V or fevers. He's also noticed BLE edema during that time. Again, no pain noted. He didn't try any medications or therapies to help. He denies recent trips or immobility. He visited his PCP yesterday for a regular follow up. After examination, they recommended that he come to the ED for evaluation. He denies any other aggravating or alleviating factors.    ? ?Review of Systems: As mentioned in the history of present illness. All other systems reviewed and are negative. ?Past Medical History:  ?Diagnosis Date  ? Alcohol abuse   ? Back pain   ? Diabetes mellitus without complication ([facility])   ? Elevated LDL cholesterol level 10/05/2016  ? Essential hypertension 12/21/2006  ? Qualifier: Diagnosis of  By: McPherson MD, Barbara    ? GERD 12/21/2006  ? Qualifier: Diagnosis of  By: McPherson MD, Barbara    ? HEPATITIS C, CHRONIC VIRAL, W/O HEPATIC COMA 12/24/2006  ? Qualifier: Diagnosis of  By: McPherson MD, Barbara    ? ?Past Surgical History:  ?Procedure Laterality Date  ? ABDOMINAL EXPOSURE N/A 02/23/2018  ? Procedure: ABDOMINAL EXPOSURE;  Surgeon: Early, Todd F, MD;  Location: [facility] OR;  Service: Vascular;  Laterality: N/A;  ? ANTERIOR LUMBAR FUSION N/A 02/23/2018  ? Procedure: ANTERIOR LUMBAR INTERBODY FUSION LUMBAR 5 - SACRUM 1;  Surgeon: Dumonski, Mark, MD;  Location: [facility] OR;  Service: Orthopedics;  Laterality: N/A;  ? BACK SURGERY    ? FOOT SURGERY    ? KNEE SURGERY    ? ?Social History:  reports that  he has quit smoking. His smoking use included cigarettes. He has never used smokeless tobacco. He reports current alcohol use. He reports that he does not use drugs. ? ?No Known Allergies ? ?Family History  ?Problem Relation Age of Onset  ? Hypertension Mother   ? Hypertension Father   ? ? ?Prior to Admission medications   ?Medication Sig Start Date End Date Taking? Authorizing Provider  ?amLODipine (NORVASC) 5 MG tablet Take 5 mg by mouth daily. 06/17/21  Yes [provider]  ?carvedilol (COREG) 12.5 MG tablet TAKE 1 TABLET(12.5 MG) BY MOUTH TWICE DAILY WITH A MEAL ?Patient taking differently: Take 12.5 mg by mouth 2 (two) times daily. 01/04/18  Yes Weber, Sarah L, PA-C  ?diazepam (VALIUM) 5 MG tablet Take 5 mg by mouth daily as needed (for MRI). 12/20/20  Yes [provider]  ?furosemide (LASIX) 40 MG tablet Take 40 mg by mouth daily.   Yes [provider]  ?hydrocortisone (ANUSOL-HC) 2.5 % rectal cream Place 1 application rectally 2 (two) times daily. 03/28/21  Yes [provider]  ?silver sulfADIAZINE (SILVADENE) 1 % cream Apply 1 application. topically daily. Apply to skin graft on right foot 05/01/21  Yes [provider]  ?sodium chloride (OCEAN) 0.65 % SOLN nasal spray Place 1 spray into both nostrils daily.   Yes [provider]  ?tetrahydrozoline 0.05 % ophthalmic solution Place 2 drops into both eyes daily as needed (dry, red eys).     Yes [provider]  ?potassium chloride (KLOR-CON) 10 MEQ tablet Take 10 mEq by mouth 2 (two) times daily. ?Patient not taking: Reported on 08/30/2021    [provider]  ? ? ?Physical Exam: ?Vitals:  ? 08/30/21 0300 08/30/21 0400 08/30/21 0416 08/30/21 0500  ?BP: (!) 146/96 129/88 (!) 146/96 135/87  ?Pulse: 66 73 66 75  ?Resp: _0 ?Temp: 98.2 ?F (36.8 ?C)  98.2 ?F (36.8 ?C)   ?TempSrc: Oral  Oral   ?SpO2: 96% 94%  97%  ?Weight: 72.6 kg  72.6 kg   ?Height:   6' (1.829 m)   ? ?General: 71 y.o. male  resting in bed in NAD ?Eyes: PERRL, normal sclera ?ENMT: Nares patent w/o discharge, orophaynx clear, dentition normal, ears w/o discharge/lesions/ulcers ?Neck: Supple, trachea midline ?Cardiovascular: RRR, +S1, S2, no m/g/r, equal pulses throughout ?Respiratory: CTABL, no w/r/r, normal WOB ?GI: BS+, distended, soft, NT, no masses noted, no organomegaly noted ?MSK: No c/c; LLE 1-2+ pitting to shin, RLE 1+ ?Neuro: A&O x 3, no focal deficits ?Psyc: Appropriate interaction and affect, calm/cooperative ? ?Data Reviewed: ? ?Na+  130 ?Cl- 96 ?Scr  0.59 ?Alk phos 215 ?Albumin  2.4 ?AST  72 ?Hgb  12.7 ?MCV  100.8 ? ?CT ab/pelivs w/ con ?IMPRESSION: ?1. Large heterogeneous mass in the right liver measuring up to 13 cm worrisome for primary hepatocellular carcinoma. ?2. Additional numerous smaller nodules throughout the liver may ?represent cirrhosis fifth regenerating nodules, but metastatic ?disease not excluded. ?3. Portal vein patency can not be confirmed on this study. Consider ?further evaluation with ultrasound. ?4. Large volume ascites. ?5. Cholelithiasis. ?6.  Aortic Atherosclerosis (ICD10-I70.0). ? ?Assessment and Plan: ?No notes have been filed under this hospital service. ?Service: Hospitalist ?Abdominal distention ?Ascites ?    - admitted to inpt, SDU; transfer to tele ?    - paracentesis and labs ordered ?    - albumin for paracentesis ordered ?    - no fever, WBC normal; hold on abx for now ? ?Liver mass ?Elevated LFTs ?Chronic Hep C ?    - has Hx of hep C ?    - alpha fetoprotein ordered ?    - spoke with [facility] (Dr. Alen Blew); will arrange for outpt f/u ?    - check hep panel ? ?Mild cognitive impairment ?    - continue home regimen ? ?HTN ?    - continue home regimen ? ?HLD ?    - hold statin d/t elevated LFTs ? ?DM2 ?    - last A1c on 05/31/21 was 5.9 ?    - rpt A1c, DM diet, daily glucose ? ?EtOH abuse ?    - CIWA protocol ? ?Chronic hyponatremia ?    - at baseline, monitor ? ?Moderate protein-calorie  malnutrition ?    - dietitian consult ? ?Macrocytic anemia ?    - at baseline, follow ? ?GERD ?    - PPI ? ?Advance Care Planning:   Code Status: FULL ? ?Consults: IR ? ?Family Communication: w/ wife at bedside ? ?Severity of Illness: ?The appropriate patient status for this patient is INPATIENT. Inpatient status is judged to be reasonable and necessary in order to provide the required intensity of service to ensure the patient's safety. The patient's presenting symptoms, physical exam findings, and initial radiographic and laboratory data in the context of their chronic comorbidities is felt to place them at high risk for further clinical deterioration. Furthermore, it is not anticipated that  the patient will be medically stable for discharge from the hospital within 2 midnights of admission.  ? ?* I certify that at the point of admission it is my clinical judgment that the patient will require inpatient hospital care spanning beyond 2 midnights from the point of admission due to high intensity of service, high risk for further deterioration and high frequency of surveillance required.* ? ?Author: ? A , DO ?08/30/2021 7:16 AM ? ?For on call review [email or web address].  ?

## 2021-08-30 NOTE — Progress Notes (Signed)
ANTICOAGULATION CONSULT NOTE - Initial Consult ? ?Pharmacy Consult for heparin ?Indication: DVT ? ?No Known Allergies ? ?Patient Measurements: ?Height: 6' (182.9 cm) ?Weight: 72.6 kg (160 lb 0.9 oz) ?IBW/kg (Calculated) : 77.6 ?Heparin Dosing Weight: 72.6 kg ? ?Vital Signs: ?Temp: 97.4 ?F (36.3 ?C) (04/15 0800) ?Temp Source: Axillary (04/15 0800) ?BP: 127/91 (04/15 0935) ?Pulse Rate: 75 (04/15 0500) ? ?Labs: ?Recent Labs  ?  08/29/21 ?1050 08/29/21 ?2252  ?HGB 12.7*  --   ?HCT 37.9*  --   ?PLT 319  --   ?LABPROT  --  13.6  ?INR  --  1.1  ?CREATININE 0.59*  --   ? ? ?Estimated Creatinine Clearance: 90.8 mL/min (A) (by C-G formula based on SCr of 0.59 mg/dL (L)). ? ? ?Medical History: ?Past Medical History:  ?Diagnosis Date  ? Alcohol abuse   ? Back pain   ? Diabetes mellitus without complication ([place])   ? Elevated LDL cholesterol level 10/05/2016  ? Essential hypertension 12/21/2006  ? Qualifier: Diagnosis of  By: Leward Quan MD, Pamala Hurry    ? GERD 12/21/2006  ? Qualifier: Diagnosis of  By: Leward Quan MD, Pamala Hurry    ? HEPATITIS C, CHRONIC VIRAL, W/O HEPATIC COMA 12/24/2006  ? Qualifier: Diagnosis of  By: Leward Quan MD, Pamala Hurry    ? ? ?Medications:  ?Scheduled:  ? amLODipine  5 mg Oral Daily  ? carvedilol  12.5 mg Oral BID  ? Chlorhexidine Gluconate Cloth  6 each Topical Daily  ? folic acid  1 mg Oral Daily  ? furosemide  40 mg Oral Daily  ? hydrocortisone  1 application. Rectal BID  ? multivitamin with minerals  1 tablet Oral Daily  ? pantoprazole  40 mg Oral Daily  ? silver sulfADIAZINE  1 application. Topical Daily  ? sodium chloride  1 spray Each Nare Daily  ? thiamine  100 mg Oral Daily  ? Or  ? thiamine  100 mg Intravenous Daily  ? ? ?Assessment: ?71 yo male presenting with abdominal distension, concern for liver mass on imaging.  Also presenting with BLE.  No anticoagulation PTA and none received thus far this admit.  Dopplers positive for bilateral DVT's.  Patient is s/p paracentesis on 4/15 - discussed with IR and  ok to begin heparin drip now for VTE treatment. ? ?Baseline labs: INR 1.1, PT 13.6, Hgb 12.7, Plts 319 ? ?Goal of Therapy:  ?Heparin level 0.3-0.7 units/ml ?Monitor platelets by anticoagulation protocol: Yes ?  ?Plan:  ?Heparin 2000 units bolus x1, then begin heparin infusion at 1150 units/hr ?Check heparin level 6 hours after initiation ?Monitor daily heparin level, CBC, s/sx bleeding ?F/u further invasive workup for potential liver mass and need for heparin to be held ? ?Dimple Nanas, PharmD ?08/30/2021 10:32 AM ? ?

## 2021-08-30 NOTE — Progress Notes (Signed)
Report given via secure chat to S.Lelon Frohlich RN & E.Campbell RN ?

## 2021-08-30 NOTE — Discharge Instructions (Addendum)
Information on my medicine - ELIQUIS? (apixaban) ? ?Why was Eliquis? prescribed for you? ?Eliquis? was prescribed to treat blood clots that may have been found in the veins of your legs (deep vein thrombosis) or in your lungs (pulmonary embolism) and to reduce the risk of them occurring again. ? ?What do You need to know about Eliquis? ? ?The starting dose is 10 mg (two 5 mg tablets) taken TWICE daily for the FIRST SEVEN (7) DAYS, then on 09/11/21  the dose is reduced to ONE 5 mg tablet taken TWICE daily.  Eliquis? may be taken with or without food.  ? ?Try to take the dose about the same time in the morning and in the evening. If you have difficulty swallowing the tablet whole please discuss with your pharmacist how to take the medication safely. ? ?Take Eliquis? exactly as prescribed and DO NOT stop taking Eliquis? without talking to the doctor who prescribed the medication.  Stopping may increase your risk of developing a new blood clot.  Refill your prescription before you run out. ? ?After discharge, you should have regular check-up appointments with your healthcare provider that is prescribing your Eliquis?. ?   ?What do you do if you miss a dose? ?If a dose of ELIQUIS? is not taken at the scheduled time, take it as soon as possible on the same day and twice-daily administration should be resumed. The dose should not be doubled to make up for a missed dose. ? ?Important Safety Information ?A possible side effect of Eliquis? is bleeding. You should call your healthcare provider right away if you experience any of the following: ?Bleeding from an injury or your nose that does not stop. ?Unusual colored urine (red or dark brown) or unusual colored stools (red or black). ?Unusual bruising for unknown reasons. ?A serious fall or if you hit your head (even if there is no bleeding). ? ?Some medicines may interact with Eliquis? and might increase your risk of bleeding or clotting while on Eliquis?Marland Kitchen To help avoid this,  consult your healthcare provider or pharmacist prior to using any new prescription or non-prescription medications, including herbals, vitamins, non-steroidal anti-inflammatory drugs (NSAIDs) and supplements. ? ?This website has more information on Eliquis? (apixaban): http://[email or web address]/[email or web address] ? ?

## 2021-08-30 NOTE — Progress Notes (Signed)
ANTICOAGULATION CONSULT NOTE - Initial Consult ? ?Pharmacy Consult for heparin ?Indication: DVT ? ?No Known Allergies ? ?Patient Measurements: ?Height: 6' (182.9 cm) ?Weight: 72.6 kg (160 lb 0.9 oz) ?IBW/kg (Calculated) : 77.6 ?Heparin Dosing Weight: 72.6 kg ? ?Vital Signs: ?Temp: 97.4 ?F (36.3 ?C) (04/15 1715) ?Temp Source: Oral (04/15 1715) ?BP: 147/92 (04/15 1600) ?Pulse Rate: 73 (04/15 1600) ? ?Labs: ?Recent Labs  ?  08/29/21 ?1050 08/29/21 ?2252 08/30/21 ?1037 08/30/21 ?1822  ?HGB 12.7*  --  13.6  --   ?HCT 37.9*  --  40.0  --   ?PLT 319  --  364  --   ?LABPROT  --  13.6  --   --   ?INR  --  1.1  --   --   ?HEPARINUNFRC  --   --   --  0.24*  ?CREATININE 0.59*  --  0.71  --   ? ? ? ?Estimated Creatinine Clearance: 90.8 mL/min (by C-G formula based on SCr of 0.71 mg/dL). ? ? ?Medical History: ?Past Medical History:  ?Diagnosis Date  ? Alcohol abuse   ? Back pain   ? Diabetes mellitus without complication ([place])   ? Elevated LDL cholesterol level 10/05/2016  ? Essential hypertension 12/21/2006  ? Qualifier: Diagnosis of  By: Leward Quan MD, Pamala Hurry    ? GERD 12/21/2006  ? Qualifier: Diagnosis of  By: Leward Quan MD, Pamala Hurry    ? HEPATITIS C, CHRONIC VIRAL, W/O HEPATIC COMA 12/24/2006  ? Qualifier: Diagnosis of  By: Leward Quan MD, Pamala Hurry    ? ? ?Medications:  ?Scheduled:  ? amLODipine  5 mg Oral Daily  ? carvedilol  12.5 mg Oral BID  ? Chlorhexidine Gluconate Cloth  6 each Topical Daily  ? folic acid  1 mg Oral Daily  ? furosemide  40 mg Oral Daily  ? hydrocortisone  1 application. Rectal BID  ? multivitamin with minerals  1 tablet Oral Daily  ? pantoprazole  40 mg Oral Daily  ? silver sulfADIAZINE  1 application. Topical Daily  ? sodium chloride  1 spray Each Nare Daily  ? thiamine  100 mg Oral Daily  ? Or  ? thiamine  100 mg Intravenous Daily  ? ? ?Assessment: ?71 yo male presenting with abdominal distension, concern for liver mass on imaging.  Also presenting with BLE.  No anticoagulation PTA.  Dopplers positive for  bilateral DVT's.  Patient is s/p paracentesis on 4/15 - discussed with IR and ok to begin heparin drip now for VTE treatment. ? ?Baseline labs: INR 1.1, PT 13.6, Hgb 12.7, Plts 319 ? ?Today, 08/30/21 ?- Heparin level subtherapeutic at 0.24 on heparin 1150 units/hr ?- No signs of bleeding per RN ? ?Goal of Therapy:  ?Heparin level 0.3-0.7 units/ml ?Monitor platelets by anticoagulation protocol: Yes ?  ?Plan:  ?Increase heparin drip to 1250 units/hr ?Check heparin level in 6 hours ?Monitor daily heparin level, CBC, s/sx bleeding ?F/u further invasive workup for potential liver mass and need for heparin to be held ? ?Dimple Nanas, PharmD ?08/30/2021 7:06 PM ? ?

## 2021-08-31 DIAGNOSIS — I82403 Acute embolism and thrombosis of unspecified deep veins of lower extremity, bilateral: Secondary | ICD-10-CM | POA: Diagnosis present

## 2021-08-31 DIAGNOSIS — K7031 Alcoholic cirrhosis of liver with ascites: Secondary | ICD-10-CM

## 2021-08-31 LAB — COMPREHENSIVE METABOLIC PANEL
ALT: 33 U/L (ref 0–44)
AST: 62 U/L — ABNORMAL HIGH (ref 15–41)
Albumin: 2.1 g/dL — ABNORMAL LOW (ref 3.5–5.0)
Alkaline Phosphatase: 164 U/L — ABNORMAL HIGH (ref 38–126)
Anion gap: 5 (ref 5–15)
BUN: 13 mg/dL (ref 8–23)
CO2: 27 mmol/L (ref 22–32)
Calcium: 7.8 mg/dL — ABNORMAL LOW (ref 8.9–10.3)
Chloride: 99 mmol/L (ref 98–111)
Creatinine, Ser: 0.53 mg/dL — ABNORMAL LOW (ref 0.61–1.24)
GFR, Estimated: >60 mL/min (ref >60)
Glucose, Bld: 84 mg/dL (ref 70–99)
Potassium: 3.9 mmol/L (ref 3.5–5.1)
Sodium: 131 mmol/L — ABNORMAL LOW (ref 135–145)
Total Bilirubin: 0.9 mg/dL (ref 0.3–1.2)
Total Protein: 5.7 g/dL — ABNORMAL LOW (ref 6.5–8.1)

## 2021-08-31 LAB — CBC
HCT: 35.3 % — ABNORMAL LOW (ref 39.0–52.0)
Hemoglobin: 12.2 g/dL — ABNORMAL LOW (ref 13.0–17.0)
MCH: 34.3 pg — ABNORMAL HIGH (ref 26.0–34.0)
MCHC: 34.6 g/dL (ref 30.0–36.0)
MCV: 99.2 fL (ref 80.0–100.0)
Platelets: 333 10*3/uL (ref 150–400)
RBC: 3.56 MIL/uL — ABNORMAL LOW (ref 4.22–5.81)
RDW: 12.8 % (ref 11.5–15.5)
WBC: 6.9 10*3/uL (ref 4.0–10.5)
nRBC: 0 % (ref 0.0–0.2)

## 2021-08-31 LAB — HEPARIN LEVEL (UNFRACTIONATED)
Heparin Unfractionated: 0.33 IU/mL (ref 0.30–0.70)
Heparin Unfractionated: 0.59 IU/mL (ref 0.30–0.70)

## 2021-08-31 MED ORDER — ENSURE ENLIVE PO LIQD
237.0000 mL | Freq: Two times a day (BID) | ORAL | Status: DC
  Administered 2021-09-01 – 2021-09-06 (×6): 237 mL via ORAL

## 2021-08-31 NOTE — Assessment & Plan Note (Addendum)
Continue CIWA protocol.  Monitor.  No evidence of withdrawal. ?

## 2021-08-31 NOTE — Assessment & Plan Note (Deleted)
-   has Hx of hep C ?    - alpha fetoprotein pending ?    - outpatient oncologist (Dr. Alen Blew); will arrange for outpt f/u ?    - follow up hepatitis panel ?- if AFP elevated, consider Liver bx prior to dc ?

## 2021-08-31 NOTE — Assessment & Plan Note (Addendum)
Currently statin on hold.    - hold statin d/t elevated LFTs ?

## 2021-08-31 NOTE — Assessment & Plan Note (Addendum)
4/15 [place] BLE demosntrates DVT in both lower extremities. ?Started on heparin GTT. ?Currently switching to Eliquis as it is more usable in child pugh and B categories. ?

## 2021-08-31 NOTE — Assessment & Plan Note (Signed)
Home medications restarted ?

## 2021-08-31 NOTE — Progress Notes (Signed)
Initial Nutrition Assessment ? ?DOCUMENTATION CODES:  ? ?Not applicable ? ?INTERVENTION:  ?- Liberalize diet from a carb modified to a regular diet to provide widest variety of menu options to enhance nutritional adequacy ? ?- Continue MVI with minerals daily ? ?- Ensure Enlive po BID, each supplement provides 350 kcal and 20 grams of protein. ? ?NUTRITION DIAGNOSIS:  ? ?Increased nutrient needs related to acute illness as evidenced by estimated needs. ? ?GOAL:  ? ?Patient will meet greater than or equal to 90% of their needs ? ?MONITOR:  ? ?PO intake, Supplement acceptance, Diet advancement, Labs, Weight trends ? ?REASON FOR ASSESSMENT:  ? ?Consult ?Assessment of nutrition requirement/status ? ?ASSESSMENT:  ? ?Pt admitted with abdominal distension secondary to ascites. PMH significant for EtOH abuse, cognitive disorder, HTN, T2DM, chronic hep C, GERD, and HLD. ? ?CT findings concerning for hepatocellular carcinoma with additional numerous smaller nodules throughout the liver may represent cirrhosis but metastatic disease not excluded.  ? ?04/15: s/p paracentesis yielding 3.8L  ? ?Unsuccessful attempt to reach pt via phone call to room as RD is working remotely. Pt's diabetes appears to be well controlled, will liberalize diet to a 2gm sodium to aid in fluid retention. Pt would also benefit from the addition of a nutrition supplement given increased nutritional needs. Will place order and continue to adjust as appropriate throughout admission. ? ?Meal completion: ?04/16: 80%-lunch ? ?Reviewed weight history. Pt noted to have a 5% weight loss within the last 3 months. Unable to determine how much of this weight loss is attributed to true dry weight loss as pt with ascites.  ? ?Suspect a degree of malnutrition may be present. However unable to confirm at this time given limited nutrition and weight history and inability to perform nutrition focused physical exam.  ? ?Medications: folvite, lasix, MVI, protonix,  thiamine ? ?Labs: sodium 131, Cr 0.53, alkaline phosphatase 164, AST 62, ? HgbA1c 5.3% ? ?NUTRITION - FOCUSED PHYSICAL EXAM: ?RD working remotely. Deferred to follow up. ? ?Diet Order:   ?Diet Order   ? ?       ?  Diet 2 gram sodium Room service appropriate? Yes; Fluid consistency: Thin  Diet effective now       ?  ? ?  ?  ? ?  ? ? ?EDUCATION NEEDS:  ? ?No education needs have been identified at this time ? ?Skin:  Skin Assessment: Reviewed RN Assessment ? ?Last BM:  PTA ? ?Height:  ? ?Ht Readings from Last 1 Encounters:  ?08/30/21 6' (1.829 m)  ? ? ?Weight:  ? ?Wt Readings from Last 1 Encounters:  ?08/30/21 69.3 kg  ? ?BMI:  Body mass index is 20.72 kg/m?. ? ?Estimated Nutritional Needs:  ? ?Kcal:  1900-2100 ? ?Protein:  95-110g ? ?Fluid:  >/=1.9L ? ?Gary Maddox, RDN, LDN ?Clinical Nutrition ?

## 2021-08-31 NOTE — Assessment & Plan Note (Signed)
Continue home medications ?S/p Paracentesis, BP stable ?

## 2021-08-31 NOTE — Progress Notes (Signed)
ANTICOAGULATION CONSULT NOTE  ? ?Pharmacy Consult for heparin ?Indication: DVT ? ?No Known Allergies ? ?Patient Measurements: ?Height: 6' (182.9 cm) ?Weight: 69.3 kg (152 lb 12.5 oz) ?IBW/kg (Calculated) : 77.6 ?Heparin Dosing Weight: 72.6 kg ? ?Vital Signs: ?Temp: 97.8 ?F (36.6 ?C) (04/16 3810) ?Temp Source: Oral (04/16 1751) ?BP: 123/87 (04/16 0258) ?Pulse Rate: 63 (04/16 0807) ? ?Labs: ?Recent Labs  ?  08/29/21 ?1050 08/29/21 ?2252 08/30/21 ?1037 08/30/21 ?1822 08/31/21 ?0159 08/31/21 ?0841  ?HGB 12.7*  --  13.6  --  12.2*  --   ?HCT 37.9*  --  40.0  --  35.3*  --   ?PLT 319  --  364  --  333  --   ?LABPROT  --  13.6  --   --   --   --   ?INR  --  1.1  --   --   --   --   ?HEPARINUNFRC  --   --   --  0.24* 0.33 0.59  ?CREATININE 0.59*  --  0.71  --  0.53*  --   ? ? ? ?Estimated Creatinine Clearance: 86.6 mL/min (A) (by C-G formula based on SCr of 0.53 mg/dL (L)). ? ? ?Medical History: ?Past Medical History:  ?Diagnosis Date  ? Alcohol abuse   ? Back pain   ? Diabetes mellitus without complication ([place])   ? Elevated LDL cholesterol level 10/05/2016  ? Essential hypertension 12/21/2006  ? Qualifier: Diagnosis of  By: Leward Quan MD, Pamala Hurry    ? GERD 12/21/2006  ? Qualifier: Diagnosis of  By: Leward Quan MD, Pamala Hurry    ? HEPATITIS C, CHRONIC VIRAL, W/O HEPATIC COMA 12/24/2006  ? Qualifier: Diagnosis of  By: Leward Quan MD, Pamala Hurry    ? ? ?Medications:  ?Scheduled:  ? amLODipine  5 mg Oral Daily  ? carvedilol  12.5 mg Oral BID  ? Chlorhexidine Gluconate Cloth  6 each Topical Daily  ? folic acid  1 mg Oral Daily  ? furosemide  40 mg Oral Daily  ? hydrocortisone  1 application. Rectal BID  ? multivitamin with minerals  1 tablet Oral Daily  ? pantoprazole  40 mg Oral Daily  ? silver sulfADIAZINE  1 application. Topical Daily  ? sodium chloride  1 spray Each Nare Daily  ? thiamine  100 mg Oral Daily  ? Or  ? thiamine  100 mg Intravenous Daily  ? ? ?Assessment: ?71 yo male presenting with abdominal distension, concern for  liver mass on imaging.  Also presenting with BLE edema.  No anticoagulation PTA.  Dopplers positive for bilateral DVT's.  Patient is s/p paracentesis on 4/15 - discussed with IR and ok to begin heparin drip now for VTE treatment. ? ?Baseline labs: INR 1.1, PT 13.6, Hgb 12.7, Plts 319 ? ?Today, 08/31/21 ?- Heparin level 0.59- therapeutic on heparin 1250 units/hr ?- CBC remains stable- Hg slightly low at 12.2, pltc WNL ?- No signs of bleeding or infusion issues documented ? ?Goal of Therapy:  ?Heparin level 0.3-0.7 units/ml ?Monitor platelets by anticoagulation protocol: Yes ?  ?Plan:  ?Continue heparin drip at 1250 units/hr ?Monitor daily heparin level, CBC, s/sx bleeding ? ?Peggyann Juba, PharmD, BCPS ?Pharmacy: [phone] ?08/31/2021 9:35 AM ? ?

## 2021-08-31 NOTE — Progress Notes (Signed)
ANTICOAGULATION CONSULT NOTE  ? ?Pharmacy Consult for heparin ?Indication: DVT ? ?No Known Allergies ? ?Patient Measurements: ?Height: 6' (182.9 cm) ?Weight: 69.3 kg (152 lb 12.5 oz) ?IBW/kg (Calculated) : 77.6 ?Heparin Dosing Weight: 72.6 kg ? ?Vital Signs: ?Temp: 98.2 ?F (36.8 ?C) (04/15 2301) ?Temp Source: Oral (04/15 2301) ?BP: 121/89 (04/15 2301) ?Pulse Rate: 64 (04/15 2301) ? ?Labs: ?Recent Labs  ?  08/29/21 ?1050 08/29/21 ?2252 08/30/21 ?1037 08/30/21 ?1822 08/31/21 ?0159  ?HGB 12.7*  --  13.6  --  12.2*  ?HCT 37.9*  --  40.0  --  35.3*  ?PLT 319  --  364  --  333  ?LABPROT  --  13.6  --   --   --   ?INR  --  1.1  --   --   --   ?HEPARINUNFRC  --   --   --  0.24* 0.33  ?CREATININE 0.59*  --  0.71  --   --   ? ? ? ?Estimated Creatinine Clearance: 86.6 mL/min (by C-G formula based on SCr of 0.71 mg/dL). ? ? ?Medical History: ?Past Medical History:  ?Diagnosis Date  ? Alcohol abuse   ? Back pain   ? Diabetes mellitus without complication ([place])   ? Elevated LDL cholesterol level 10/05/2016  ? Essential hypertension 12/21/2006  ? Qualifier: Diagnosis of  By: Leward Quan MD, Pamala Hurry    ? GERD 12/21/2006  ? Qualifier: Diagnosis of  By: Leward Quan MD, Pamala Hurry    ? HEPATITIS C, CHRONIC VIRAL, W/O HEPATIC COMA 12/24/2006  ? Qualifier: Diagnosis of  By: Leward Quan MD, Pamala Hurry    ? ? ?Medications:  ?Scheduled:  ? amLODipine  5 mg Oral Daily  ? carvedilol  12.5 mg Oral BID  ? Chlorhexidine Gluconate Cloth  6 each Topical Daily  ? folic acid  1 mg Oral Daily  ? furosemide  40 mg Oral Daily  ? hydrocortisone  1 application. Rectal BID  ? multivitamin with minerals  1 tablet Oral Daily  ? pantoprazole  40 mg Oral Daily  ? silver sulfADIAZINE  1 application. Topical Daily  ? sodium chloride  1 spray Each Nare Daily  ? thiamine  100 mg Oral Daily  ? Or  ? thiamine  100 mg Intravenous Daily  ? ? ?Assessment: ?71 yo male presenting with abdominal distension, concern for liver mass on imaging.  Also presenting with BLE edema.  No  anticoagulation PTA.  Dopplers positive for bilateral DVT's.  Patient is s/p paracentesis on 4/15 - discussed with IR and ok to begin heparin drip now for VTE treatment. ? ?Baseline labs: INR 1.1, PT 13.6, Hgb 12.7, Plts 319 ? ?Today, 08/31/21 ?- Heparin level 0.33- therapeutic on heparin 1250 units/hr ?- CBC remains stable- Hg slightly low at 12.2, pltc WNL ?- No signs of bleeding or infusion issues per RN ? ?Goal of Therapy:  ?Heparin level 0.3-0.7 units/ml ?Monitor platelets by anticoagulation protocol: Yes ?  ?Plan:  ?Continue heparin drip at 1250 units/hr ?Check confirmatory heparin level in 6 hours ?Monitor daily heparin level, CBC, s/sx bleeding ?F/u further invasive workup for potential liver mass and need for heparin to be held ? ?Netta Cedars, PharmD ?08/31/2021 2:45 AM ? ?

## 2021-08-31 NOTE — Assessment & Plan Note (Signed)
Stable, monitor.  °

## 2021-08-31 NOTE — Plan of Care (Signed)

## 2021-08-31 NOTE — Progress Notes (Signed)
?Progress Note ? ? ?Patient: Gary Maddox JSH:[id number] DOB: September 01, 1950 DOA: 08/29/2021     1 ?DOS: the patient was seen and examined on 08/31/2021 ?  ?Brief hospital course: ?71 y.o. male with medical history significant of EtOH abuse, cognitive disorder, HTN, DM2, chronic hep C, GERD, HLD. Presenting with abdominal distention. He reports that his stomach has been swelling for the last 2 weeks. He hasn't had any pain, N/V or fevers. He's also noticed BLE edema during that time. Again, no pain noted. He didn't try any medications or therapies to help. He denies recent trips or immobility. He visited his PCP yesterday for a regular follow up. After examination, they recommended that he come to the ED for evaluation. He denies any other aggravating or alleviating factors.   ? ?4/16 s/p paracentesis- -3.8L removed, no acute evidence of SBP; follow cultures ?Continue heparin gtt, as pending liver bx. Pending AFP result. ?Transition to [place] ? ?Assessment and Plan: ?Alcoholic cirrhosis of liver with ascites ([facility]) ?- has Hx of hep C ?    - alpha fetoprotein pending ?    - outpatient oncologist (Dr. Alen Blew); will arrange for outpt f/u ?    - follow up hepatitis panel ?- if AFP elevated, consider Liver bx prior to dc ? ?DVT, bilateral lower limbs ([place]) ?4/15 [place] BLE demosntrates DVT in R  ?posterior tibial veins, and right gastrocnemius veins.  ?And L external iliac vein, left common femoral vein, and left proximal profunda vein.  ?On Heparin gTT for now considering inpatient workup of liver lesions IR Liver biopsy. Consider transition to eliquis. ? ?HLD (hyperlipidemia) ?    - hold statin d/t elevated LFTs ? ?Hyponatremia ?Stable, monitor ? ?Mild cognitive impairment ?Home medications restarted ? ?Essential hypertension ?Continue home medications ?S/p Paracentesis, BP stable ? ?ABUSE, ALCOHOL, IN REMISSION ?CIWA ? ? ? ? ?  ? ?Subjective:  NAEON ?Eating breakfast, tolerated Paracentesis well. ?Reports improvement in  abdominal distention/discomfort ?Discussed imaging, careplan with patient and family member, all questions and concerns addressed. No evidence of SBP on paracentesis, f/u culture. ?BLE DVT discovered ? ? ?Physical Exam: ?Vitals:  ? 08/30/21 1937 08/30/21 2301 08/31/21 0424 08/31/21 8502  ?BP: (!) 143/93 121/89 129/88 123/87  ?Pulse: 70 64 72 63  ?Resp: '20 16 18 18  '$ ?Temp: 98.5 ?F (36.9 ?C) 98.2 ?F (36.8 ?C) 98.7 ?F (37.1 ?C) 97.8 ?F (36.6 ?C)  ?TempSrc: Oral Oral Oral Oral  ?SpO2: 98% 97% 100% 96%  ?Weight: 69.3 kg     ?Height: 6' (1.829 m)     ?71 y.o. male resting in bed in NAD ?Eyes: PERRL, normal sclera ?ENMT: Nares patent w/o discharge, orophaynx clear, dentition normal, ears w/o discharge/lesions/ulcers ?Neck: Supple, trachea midline ?Cardiovascular: RRR, +S1, S2, no m/g/r, equal pulses throughout ?Respiratory: CTABL, no w/r/r, normal WOB ?GI: BS+, distended, soft, NT, no masses noted, no organomegaly noted ?MSK: No c/c; LLE 1-2+ pitting to shin, RLE 1+ ?Neuro: A&O x 3, no focal deficits ?Psyc: Appropriate interaction and affect, calm/cooperative ?Data Reviewed: ? ?Results for orders placed or performed during the hospital encounter of 08/29/21 (from the past 24 hour(s))  ?Heparin level (unfractionated)     Status: Abnormal  ? Collection Time: 08/30/21  6:22 PM  ?Result Value Ref Range  ? Heparin Unfractionated 0.24 (L) 0.30 - 0.70 IU/mL  ?Comprehensive metabolic panel     Status: Abnormal  ? Collection Time: 08/31/21  1:59 AM  ?Result Value Ref Range  ? Sodium 131 (L) 135 - 145 mmol/L  ?  Potassium 3.9 3.5 - 5.1 mmol/L  ? Chloride 99 98 - 111 mmol/L  ? CO2 27 22 - 32 mmol/L  ? Glucose, Bld 84 70 - 99 mg/dL  ? BUN 13 8 - 23 mg/dL  ? Creatinine, Ser 0.53 (L) 0.61 - 1.24 mg/dL  ? Calcium 7.8 (L) 8.9 - 10.3 mg/dL  ? Total Protein 5.7 (L) 6.5 - 8.1 g/dL  ? Albumin 2.1 (L) 3.5 - 5.0 g/dL  ? AST 62 (H) 15 - 41 U/L  ? ALT 33 0 - 44 U/L  ? Alkaline Phosphatase 164 (H) 38 - 126 U/L  ? Total Bilirubin 0.9 0.3 - 1.2 mg/dL   ? GFR, Estimated >60 >60 mL/min  ? Anion gap 5 5 - 15  ?CBC     Status: Abnormal  ? Collection Time: 08/31/21  1:59 AM  ?Result Value Ref Range  ? WBC 6.9 4.0 - 10.5 K/uL  ? RBC 3.56 (L) 4.22 - 5.81 MIL/uL  ? Hemoglobin 12.2 (L) 13.0 - 17.0 g/dL  ? HCT 35.3 (L) 39.0 - 52.0 %  ? MCV 99.2 80.0 - 100.0 fL  ? MCH 34.3 (H) 26.0 - 34.0 pg  ? MCHC 34.6 30.0 - 36.0 g/dL  ? RDW 12.8 11.5 - 15.5 %  ? Platelets 333 150 - 400 K/uL  ? nRBC 0.0 0.0 - 0.2 %  ?Heparin level (unfractionated)     Status: None  ? Collection Time: 08/31/21  1:59 AM  ?Result Value Ref Range  ? Heparin Unfractionated 0.33 0.30 - 0.70 IU/mL  ?Heparin level (unfractionated)     Status: None  ? Collection Time: 08/31/21  8:41 AM  ?Result Value Ref Range  ? Heparin Unfractionated 0.59 0.30 - 0.70 IU/mL  ?  ? ?Family Communication: spouse at bedside ? ?Disposition: ?Status is: Inpatient ?Remains inpatient appropriate because: hepatic congestion, AMS, liver lesion workup ? Planned Discharge Destination: Home ? ? ? ?Time spent: 35 minutes ? ?Author: ?Vanna Scotland, MD ?08/31/2021 12:45 PM ? ?For on call review [email or web address].  ?

## 2021-08-31 NOTE — Hospital Course (Addendum)
71 y.o. male with medical history significant of EtOH abuse, cognitive disorder, HTN, DM2, chronic hep C, GERD, HLD. Presenting with abdominal distention.  Found to have ascites as well as liver mass.  [facility] as well as hematology both consulted. ?4/16 s/p paracentesis- -3.8L removed,  ?4/18 MRI liver confirms hepatocellular carcinoma findings. ?4/19 palliative care consulted.  4/20 have discussion with family with regards to goals of care and recommendation for hospice from oncology. ?

## 2021-09-01 DIAGNOSIS — R188 Other ascites: Secondary | ICD-10-CM

## 2021-09-01 LAB — HEPARIN LEVEL (UNFRACTIONATED)
Heparin Unfractionated: 0.71 IU/mL — ABNORMAL HIGH (ref 0.30–0.70)
Heparin Unfractionated: 0.68 IU/mL (ref 0.30–0.70)
Heparin Unfractionated: 0.86 IU/mL — ABNORMAL HIGH (ref 0.30–0.70)

## 2021-09-01 LAB — CBC
HCT: 35.2 % — ABNORMAL LOW (ref 39.0–52.0)
Hemoglobin: 11.9 g/dL — ABNORMAL LOW (ref 13.0–17.0)
MCH: 33.6 pg (ref 26.0–34.0)
MCHC: 33.8 g/dL (ref 30.0–36.0)
MCV: 99.4 fL (ref 80.0–100.0)
Platelets: 360 10*3/uL (ref 150–400)
RBC: 3.54 MIL/uL — ABNORMAL LOW (ref 4.22–5.81)
RDW: 12.9 % (ref 11.5–15.5)
WBC: 7 10*3/uL (ref 4.0–10.5)
nRBC: 0 % (ref 0.0–0.2)

## 2021-09-01 LAB — AFP TUMOR MARKER: AFP, Serum, Tumor Marker: 25.2 ng/mL — ABNORMAL HIGH (ref 0.0–8.4)

## 2021-09-01 MED ORDER — OXYCODONE HCL 5 MG PO TABS
5.0000 mg | ORAL_TABLET | Freq: Once | ORAL | Status: AC
  Administered 2021-09-01: 5 mg via ORAL
  Filled 2021-09-01: qty 1

## 2021-09-01 NOTE — Progress Notes (Signed)
ANTICOAGULATION CONSULT NOTE  ? ?Pharmacy Consult for heparin ?Indication: DVT ? ?No Known Allergies ? ?Patient Measurements: ?Height: 6' (182.9 cm) ?Weight: 69.3 kg (152 lb 12.5 oz) ?IBW/kg (Calculated) : 77.6 ?Heparin Dosing Weight: 72.6 kg ? ?Vital Signs: ?Temp: 98.2 ?F (36.8 ?C) (04/17 0358) ?Temp Source: Oral (04/17 0358) ?BP: 105/70 (04/17 0946) ?Pulse Rate: 76 (04/17 0946) ? ?Labs: ?Recent Labs  ?   ?0000 08/29/21 ?2252 08/30/21 ?1037 08/30/21 ?1822 08/31/21 ?0159 08/31/21 ?1610 09/01/21 ?0355 09/01/21 ?1058  ?HGB   < >  --  13.6  --  12.2*  --  11.9*  --   ?HCT  --   --  40.0  --  35.3*  --  35.2*  --   ?PLT  --   --  364  --  333  --  360  --   ?LABPROT  --  13.6  --   --   --   --   --   --   ?INR  --  1.1  --   --   --   --   --   --   ?HEPARINUNFRC  --   --   --    < > 0.33 0.59 0.71* 0.68  ?CREATININE  --   --  0.71  --  0.53*  --   --   --   ? < > = values in this interval not displayed.  ? ? ? ?Estimated Creatinine Clearance: 86.6 mL/min (A) (by C-G formula based on SCr of 0.53 mg/dL (L)). ? ? ?Medical History: ?Past Medical History:  ?Diagnosis Date  ? Alcohol abuse   ? Back pain   ? Diabetes mellitus without complication ([place])   ? Elevated LDL cholesterol level 10/05/2016  ? Essential hypertension 12/21/2006  ? Qualifier: Diagnosis of  By: Leward Quan MD, Pamala Hurry    ? GERD 12/21/2006  ? Qualifier: Diagnosis of  By: Leward Quan MD, Pamala Hurry    ? HEPATITIS C, CHRONIC VIRAL, W/O HEPATIC COMA 12/24/2006  ? Qualifier: Diagnosis of  By: Leward Quan MD, Pamala Hurry    ? ? ?Medications:  ?Scheduled:  ? amLODipine  5 mg Oral Daily  ? carvedilol  12.5 mg Oral BID  ? Chlorhexidine Gluconate Cloth  6 each Topical Daily  ? feeding supplement  237 mL Oral BID BM  ? folic acid  1 mg Oral Daily  ? furosemide  40 mg Oral Daily  ? hydrocortisone  1 application. Rectal BID  ? multivitamin with minerals  1 tablet Oral Daily  ? pantoprazole  40 mg Oral Daily  ? silver sulfADIAZINE  1 application. Topical Daily  ? sodium chloride   1 spray Each Nare Daily  ? thiamine  100 mg Oral Daily  ? Or  ? thiamine  100 mg Intravenous Daily  ? ? ?Assessment: ?71 yo male presenting with abdominal distension, concern for liver mass on imaging.  Also presenting with BLE edema.  No anticoagulation PTA.  Dopplers positive for bilateral DVT's.  Patient is s/p paracentesis on 4/15 - discussed with IR and ok to begin heparin drip now for VTE treatment. ? ?Baseline labs: INR 1.1, PT 13.6, Hgb 12.7, Plts 319 ? ?Today, 09/01/21 ?- Heparin level 0.68, therapeutic on heparin 1150 units/hr ?- CBC remains stable- Hg slightly low at 11.9, pltc WNL ?- Scr/LFTs stable ?- No signs of bleeding or infusion issues reported by RN ? ?Goal of Therapy:  ?Heparin level 0.3-0.7 units/ml ?Monitor platelets by anticoagulation protocol: Yes ?  ?Plan:  ?  Continue  heparin drip at 1150 units/hr ?Recheck heparin level in 6h ?Monitor daily heparin level, CBC, s/sx bleeding ? ? ?Royetta Asal, PharmD, BCPS ?09/01/2021 12:17 PM ? ? ?

## 2021-09-01 NOTE — Plan of Care (Signed)

## 2021-09-01 NOTE — Plan of Care (Signed)
  Problem: Education: Goal: Knowledge of General Education information will improve Description Including pain rating scale, medication(s)/side effects and non-pharmacologic comfort measures Outcome: Progressing   

## 2021-09-01 NOTE — Progress Notes (Signed)
ANTICOAGULATION CONSULT NOTE  ? ?Pharmacy Consult for heparin ?Indication: DVT ? ?No Known Allergies ? ?Patient Measurements: ?Height: 6' (182.9 cm) ?Weight: 69.3 kg (152 lb 12.5 oz) ?IBW/kg (Calculated) : 77.6 ?Heparin Dosing Weight: 72.6 kg ? ?Vital Signs: ?Temp: 97.4 ?F (36.3 ?C) (04/17 1415) ?Temp Source: Oral (04/17 1415) ?BP: 106/64 (04/17 1415) ?Pulse Rate: 76 (04/17 1415) ? ?Labs: ?Recent Labs  ?   ?0000 08/29/21 ?2252 08/30/21 ?1037 08/30/21 ?1822 08/31/21 ?0159 08/31/21 ?4259 09/01/21 ?0355 09/01/21 ?1058 09/01/21 ?1657  ?HGB   < >  --  13.6  --  12.2*  --  11.9*  --   --   ?HCT  --   --  40.0  --  35.3*  --  35.2*  --   --   ?PLT  --   --  364  --  333  --  360  --   --   ?LABPROT  --  13.6  --   --   --   --   --   --   --   ?INR  --  1.1  --   --   --   --   --   --   --   ?HEPARINUNFRC  --   --   --    < > 0.33   < > 0.71* 0.68 0.86*  ?CREATININE  --   --  0.71  --  0.53*  --   --   --   --   ? < > = values in this interval not displayed.  ? ? ? ?Estimated Creatinine Clearance: 86.6 mL/min (A) (by C-G formula based on SCr of 0.53 mg/dL (L)). ? ?Medications:  ?Scheduled:  ? amLODipine  5 mg Oral Daily  ? carvedilol  12.5 mg Oral BID  ? Chlorhexidine Gluconate Cloth  6 each Topical Daily  ? feeding supplement  237 mL Oral BID BM  ? folic acid  1 mg Oral Daily  ? furosemide  40 mg Oral Daily  ? hydrocortisone  1 application. Rectal BID  ? multivitamin with minerals  1 tablet Oral Daily  ? pantoprazole  40 mg Oral Daily  ? silver sulfADIAZINE  1 application. Topical Daily  ? sodium chloride  1 spray Each Nare Daily  ? thiamine  100 mg Oral Daily  ? Or  ? thiamine  100 mg Intravenous Daily  ? ? ?Assessment: ?71 yo male presenting with abdominal distension, concern for liver mass on imaging.  Also presenting with BLE edema.  No anticoagulation PTA.  Dopplers positive for bilateral DVT's.  Patient is s/p paracentesis on 4/15 - discussed with IR and ok to begin heparin drip now for VTE treatment. ? ?Baseline  labs: INR 1.1, PT 13.6, Hgb 12.7, Plts 319 ? ?Today, 09/01/21 ?- Heparin now slightly elevated despite decreasing heparin rate to 1150 units/hr ?- CBC remains stable- Hg slightly low at 11.9, pltc WNL ?- Scr/LFTs stable ?- No signs of bleeding or infusion issues reported by RN; RN confirms from pt/family that heparin drawn from opposite arm as infusion ? ?Goal of Therapy:  ?Heparin level 0.3-0.7 units/ml ?Monitor platelets by anticoagulation protocol: Yes ?  ?Plan:  ?Reduce heparin infusion to 1000 units/hr ?Recheck heparin level in 6-8h ?Monitor daily heparin level, CBC, s/sx bleeding ? ?Reuel Boom, PharmD, BCPS ?[phone] ?09/01/2021, 6:26 PM ? ? ? ?

## 2021-09-01 NOTE — Progress Notes (Addendum)
? ?PROGRESS NOTE ? ?Gary Maddox TWS:[id number] DOB: 02-Oct-1950 DOA: 08/29/2021 ?PCP: Mancel Bale, PA-C ? ?HPI/Recap of past 24 hours: ? 71 y.o. male with medical history significant of EtOH abuse, cognitive disorder, HTN, DM2, chronic hep C, GERD, HLD. Presenting with abdominal distention. He reports that his stomach has been swelling for the last 2 weeks. He hasn't had any pain, N/V or fevers. He's also noticed BLE edema during that time. Again, no pain noted. He didn't try any medications or therapies to help. He denies recent trips or immobility. He visited his PCP yesterday for a regular follow up. After examination, they recommended that he come to the ED for evaluation. He denies any other aggravating or alleviating factors.   ?  ?4/16 s/p paracentesis- -3.8L removed, no acute evidence of SBP; follow cultures ?Continue heparin gtt, as pending liver bx. Pending AFP result. ?Transition to [place] ? ?4/17 has no new complaints today.   ? ?Assessment/Plan: ?Principal Problem: ?  Ascites ?Active Problems: ?  Chronic hepatitis C virus infection ([place]) ?  ABUSE, ALCOHOL, IN REMISSION ?  Essential hypertension ?  GERD ?  Type 2 diabetes mellitus without complication, without long-term current use of insulin ([place]) ?  Mild cognitive impairment ?  Hyponatremia ?  Moderate protein malnutrition ([place]) ?  Transaminitis ?  HLD (hyperlipidemia) ?  Macrocytic anemia ?  Liver mass ?  Abdominal distention ?  DVT, bilateral lower limbs ([place]) ?  Alcoholic cirrhosis of liver with ascites ([place]) ? ?Alcoholic cirrhosis of liver with ascites ([facility]) ?- has Hx of hep C ?    - alpha fetoprotein pending ?    - outpatient oncologist (Dr. Alen Blew); will arrange for outpt f/u ?    - follow up hepatitis panel ?- if AFP elevated, consider Liver bx prior to dc ? ?Liver masses with concern for hepatocellular carcinoma ?AFP elevated 25 ?Awaiting cytology results from thoracentesis, may consider IR liver biopsy ?  ?B/L LE DVT, bilateral lower limbs  ([facility]) ?4/15 [place] BLE demosntrates DVT in R and L ?On Heparin gTT for now considering inpatient workup of liver lesions IR Liver biopsy. Consider transition to eliquis. ?  ?HLD (hyperlipidemia) ?    - hold statin d/t elevated LFTs ?  ?Hyponatremia ?Stable, monitor ?  ?Mild cognitive impairment ?Home medications restarted ?  ?Essential hypertension ?Continue home medications ?S/p Paracentesis, BP stable ?  ?ABUSE, ALCOHOL, IN REMISSION ?CIWA ?  ?  ?Code Status: full code  ? ?Family Communication: Wife at bedside  ? ?Disposition Plan: Likely will dc to home  ? ? ?Consultants: ?IR  ? ?Procedures: ?Paracentesis  ? ?Antimicrobials: ?None  ? ?DVT prophylaxis: ? None ? ?Status is: Inpatient ? ? ? ? ?Objective: ?Vitals:  ? 08/31/21 2210 09/01/21 0358 09/01/21 0946 09/01/21 1415  ?BP: 116/76 106/76 105/70 106/64  ?Pulse: 72 70 76 76  ?Resp: '20 16  20  '$ ?Temp: 98.5 ?F (36.9 ?C) 98.2 ?F (36.8 ?C)  (!) 97.4 ?F (36.3 ?C)  ?TempSrc: Oral Oral  Oral  ?SpO2: 96% 96%  97%  ?Weight:      ?Height:      ? ? ?Intake/Output Summary (Last 24 hours) at 09/01/2021 1514 ?Last data filed at 09/01/2021 1407 ?Gross per 24 hour  ?Intake 607.97 ml  ?Output 127 ml  ?Net 480.97 ml  ? ?Filed Weights  ? 08/30/21 0300 08/30/21 0416 08/30/21 1937  ?Weight: 72.6 kg 72.6 kg 69.3 kg  ? ? ?Exam: ? ?General: 71 y.o. year-old male well developed  well nourished in no acute distress.  Alert and oriented x3. ?Cardiovascular: Regular rate and rhythm with no rubs or gallops.  No thyromegaly or JVD noted.   ?Respiratory: Clear to auscultation with no wheezes or rales. Good inspiratory effort. ?Abdomen: Soft nontender nondistended with normal bowel sounds x4 quadrants. ?Musculoskeletal: No lower extremity edema. 2/4 pulses in all 4 extremities. ?Skin: No ulcerative lesions noted or rashes, ?Psychiatry: Mood is appropriate for condition and setting ? ? ?Data Reviewed: ?CBC: ?Recent Labs  ?Lab 08/29/21 ?1050 08/30/21 ?1037 08/31/21 ?0159 09/01/21 ?0355  ?WBC 7.2 7.3 6.9  7.0  ?NEUTROABS  --  4.5  --   --   ?HGB 12.7* 13.6 12.2* 11.9*  ?HCT 37.9* 40.0 35.3* 35.2*  ?MCV 100.8* 100.0 99.2 99.4  ?PLT 319 364 333 360  ? ?Basic Metabolic Panel: ?Recent Labs  ?Lab 08/29/21 ?1050 08/30/21 ?1037 08/31/21 ?0159  ?NA 130* 134* 131*  ?K 4.0 4.2 3.9  ?CL 96* 97* 99  ?CO2 '27 30 27  '$ ?GLUCOSE 102* 80 84  ?BUN '10 13 13  '$ ?CREATININE 0.59* 0.71 0.53*  ?CALCIUM 8.5* 8.5* 7.8*  ?PHOS  --  3.9  --   ? ?GFR: ?Estimated Creatinine Clearance: 86.6 mL/min (A) (by C-G formula based on SCr of 0.53 mg/dL (L)). ?Liver Function Tests: ?Recent Labs  ?Lab 08/29/21 ?1050 08/29/21 ?2252 08/30/21 ?1037 08/31/21 ?0159  ?AST 72* 64* 62* 62*  ?ALT 39 36 35 33  ?ALKPHOS 215* 199* 196* 164*  ?BILITOT 1.0 1.0 1.1 0.9  ?PROT 6.8 6.5 6.4* 5.7*  ?ALBUMIN 2.4* 2.2* 2.3* 2.1*  ? ?Recent Labs  ?Lab 08/29/21 ?1050 08/30/21 ?1037  ?LIPASE 34  --   ?AMYLASE  --  58  ? ?Recent Labs  ?Lab 08/29/21 ?2252  ?AMMONIA 31  ? ?Coagulation Profile: ?Recent Labs  ?Lab 08/29/21 ?2252  ?INR 1.1  ? ?Cardiac Enzymes: ?No results for input(s): CKTOTAL, CKMB, CKMBINDEX, TROPONINI in the last 168 hours. ?BNP (last 3 results) ?No results for input(s): PROBNP in the last 8760 hours. ?HbA1C: ?Recent Labs  ?  08/30/21 ?1037  ?HGBA1C 5.4  ? ?CBG: ?No results for input(s): GLUCAP in the last 168 hours. ?Lipid Profile: ?No results for input(s): CHOL, HDL, LDLCALC, TRIG, CHOLHDL, LDLDIRECT in the last 72 hours. ?Thyroid Function Tests: ?No results for input(s): TSH, T4TOTAL, FREET4, T3FREE, THYROIDAB in the last 72 hours. ?Anemia Panel: ?No results for input(s): VITAMINB12, FOLATE, FERRITIN, TIBC, IRON, RETICCTPCT in the last 72 hours. ?Urine analysis: ?   ?Component Value Date/Time  ? COLORURINE AMBER (A) 08/30/2021 1125  ? APPEARANCEUR TURBID (A) 08/30/2021 1125  ? APPEARANCEUR Clear 11/09/2017 1126  ? LABSPEC >1.046 (H) 08/30/2021 1125  ? PHURINE 6.0 08/30/2021 1125  ? GLUCOSEU NEGATIVE 08/30/2021 1125  ? HGBUR NEGATIVE 08/30/2021 1125  ? BILIRUBINUR  NEGATIVE 08/30/2021 1125  ? BILIRUBINUR Negative 11/09/2017 1126  ? KETONESUR NEGATIVE 08/30/2021 1125  ? PROTEINUR 30 (A) 08/30/2021 1125  ? UROBILINOGEN 1.0 10/02/2017 1524  ? NITRITE NEGATIVE 08/30/2021 1125  ? LEUKOCYTESUR NEGATIVE 08/30/2021 1125  ? ?Sepsis Labs: ?'@LABRCNTIP'$ (procalcitonin:4,lacticidven:4) ? ?) ?Recent Results (from the past 240 hour(s))  ?MRSA Next Gen by PCR, Nasal     Status: None  ? Collection Time: 08/30/21  2:44 AM  ? Specimen: Nasal Mucosa; Nasal Swab  ?Result Value Ref Range Status  ? MRSA by PCR Next Gen NOT DETECTED NOT DETECTED Final  ?  Comment: (NOTE) ?The GeneXpert MRSA Assay (FDA approved for NASAL specimens only), ?is one component of a  comprehensive MRSA colonization surveillance ?program. It is not intended to diagnose MRSA infection nor to guide ?or monitor treatment for MRSA infections. ?Test performance is not FDA approved in patients less than 2 years ?old. ?Performed at [facility], [address] ?[place], [place] [address] ?  ?Body fluid culture w Gram Stain     Status: None (Preliminary result)  ? Collection Time: 08/30/21 10:03 AM  ? Specimen: PATH Cytology Peritoneal fluid  ?Result Value Ref Range Status  ? Specimen Description   Final  ?  PERITONEAL ?Performed at [facility], [place] [address], [place], [place] [address] ?  ? Special Requests   Final  ?  NONE ?Performed at [facility], [place] [address], [place], [place] [address] ?  ? Gram Stain   Final  ?  CYTOSPIN SMEAR ?WBC PRESENT, PREDOMINANTLY MONONUCLEAR ?NO ORGANISMS SEEN ?  ? Culture   Final  ?  NO GROWTH 2 DAYS ?Performed at [facility], [place] [address], [place], [place] [address] ?  ? Report Status PENDING  Incomplete  ?  ? ? ?Studies: ?No results found. ? ?Scheduled Meds: ? amLODipine  5 mg Oral Daily  ? carvedilol  12.5 mg Oral BID  ? Chlorhexidine Gluconate Cloth  6 each Topical Daily  ? feeding supplement  237 mL Oral BID BM  ? folic acid  1  mg Oral Daily  ? furosemide  40 mg Oral Daily  ? hydrocortisone  1 application. Rectal BID  ? multivitamin with minerals  1 tablet Oral Daily  ? oxyCODONE  5 mg Oral Once  ? pantoprazole  40 mg Oral Daily

## 2021-09-01 NOTE — Progress Notes (Signed)
ANTICOAGULATION CONSULT NOTE  ? ?Pharmacy Consult for heparin ?Indication: DVT ? ?No Known Allergies ? ?Patient Measurements: ?Height: 6' (182.9 cm) ?Weight: 69.3 kg (152 lb 12.5 oz) ?IBW/kg (Calculated) : 77.6 ?Heparin Dosing Weight: 72.6 kg ? ?Vital Signs: ?Temp: 98.2 ?F (36.8 ?C) (04/17 0358) ?Temp Source: Oral (04/17 0358) ?BP: 106/76 (04/17 0358) ?Pulse Rate: 70 (04/17 0358) ? ?Labs: ?Recent Labs  ?  08/29/21 ?1050 08/29/21 ?2252 08/30/21 ?1037 08/30/21 ?1822 08/31/21 ?0159 08/31/21 ?3220 09/01/21 ?0355  ?HGB 12.7*  --  13.6  --  12.2*  --  11.9*  ?HCT 37.9*  --  40.0  --  35.3*  --  35.2*  ?PLT 319  --  364  --  333  --  360  ?LABPROT  --  13.6  --   --   --   --   --   ?INR  --  1.1  --   --   --   --   --   ?HEPARINUNFRC  --   --   --    < > 0.33 0.59 0.71*  ?CREATININE 0.59*  --  0.71  --  0.53*  --   --   ? < > = values in this interval not displayed.  ? ? ? ?Estimated Creatinine Clearance: 86.6 mL/min (A) (by C-G formula based on SCr of 0.53 mg/dL (L)). ? ? ?Medical History: ?Past Medical History:  ?Diagnosis Date  ? Alcohol abuse   ? Back pain   ? Diabetes mellitus without complication ([place])   ? Elevated LDL cholesterol level 10/05/2016  ? Essential hypertension 12/21/2006  ? Qualifier: Diagnosis of  By: Leward Quan MD, Pamala Hurry    ? GERD 12/21/2006  ? Qualifier: Diagnosis of  By: Leward Quan MD, Pamala Hurry    ? HEPATITIS C, CHRONIC VIRAL, W/O HEPATIC COMA 12/24/2006  ? Qualifier: Diagnosis of  By: Leward Quan MD, Pamala Hurry    ? ? ?Medications:  ?Scheduled:  ? amLODipine  5 mg Oral Daily  ? carvedilol  12.5 mg Oral BID  ? Chlorhexidine Gluconate Cloth  6 each Topical Daily  ? feeding supplement  237 mL Oral BID BM  ? folic acid  1 mg Oral Daily  ? furosemide  40 mg Oral Daily  ? hydrocortisone  1 application. Rectal BID  ? multivitamin with minerals  1 tablet Oral Daily  ? pantoprazole  40 mg Oral Daily  ? silver sulfADIAZINE  1 application. Topical Daily  ? sodium chloride  1 spray Each Nare Daily  ? thiamine  100  mg Oral Daily  ? Or  ? thiamine  100 mg Intravenous Daily  ? ? ?Assessment: ?71 yo male presenting with abdominal distension, concern for liver mass on imaging.  Also presenting with BLE edema.  No anticoagulation PTA.  Dopplers positive for bilateral DVT's.  Patient is s/p paracentesis on 4/15 - discussed with IR and ok to begin heparin drip now for VTE treatment. ? ?Baseline labs: INR 1.1, PT 13.6, Hgb 12.7, Plts 319 ? ?Today, 09/01/21 ?- Heparin level 0.71- supratherapeutic on heparin 1250 units/hr ?- CBC remains stable- Hg slightly low at 11.9, pltc WNL ?- Scr/LFTs stable ?- No signs of bleeding or infusion issues reported by RN ?- Confirmed with RN/patient that lab drawn from left arm opposite heparin infusion ? ?Goal of Therapy:  ?Heparin level 0.3-0.7 units/ml ?Monitor platelets by anticoagulation protocol: Yes ?  ?Plan:  ?Decrease heparin drip to 1150 units/hr ?Recheck heparin level in 6h ?Monitor daily heparin level, CBC,  s/sx bleeding ? ?Netta Cedars, PharmD, BCPS ?Pharmacy: [phone] ?09/01/2021 4:53 AM ? ?

## 2021-09-02 ENCOUNTER — Inpatient Hospital Stay (HOSPITAL_COMMUNITY): Payer: Medicare Other

## 2021-09-02 ENCOUNTER — Other Ambulatory Visit: Payer: Self-pay | Admitting: Oncology

## 2021-09-02 DIAGNOSIS — R188 Other ascites: Secondary | ICD-10-CM | POA: Diagnosis not present

## 2021-09-02 DIAGNOSIS — R160 Hepatomegaly, not elsewhere classified: Secondary | ICD-10-CM

## 2021-09-02 LAB — BASIC METABOLIC PANEL
Anion gap: 7 (ref 5–15)
BUN: 12 mg/dL (ref 8–23)
CO2: 27 mmol/L (ref 22–32)
Calcium: 7.9 mg/dL — ABNORMAL LOW (ref 8.9–10.3)
Chloride: 97 mmol/L — ABNORMAL LOW (ref 98–111)
Creatinine, Ser: 0.77 mg/dL (ref 0.61–1.24)
GFR, Estimated: >60 mL/min (ref >60)
Glucose, Bld: 83 mg/dL (ref 70–99)
Potassium: 3.6 mmol/L (ref 3.5–5.1)
Sodium: 131 mmol/L — ABNORMAL LOW (ref 135–145)

## 2021-09-02 LAB — CBC
HCT: 34.9 % — ABNORMAL LOW (ref 39.0–52.0)
Hemoglobin: 12 g/dL — ABNORMAL LOW (ref 13.0–17.0)
MCH: 34 pg (ref 26.0–34.0)
MCHC: 34.4 g/dL (ref 30.0–36.0)
MCV: 98.9 fL (ref 80.0–100.0)
Platelets: 369 10*3/uL (ref 150–400)
RBC: 3.53 MIL/uL — ABNORMAL LOW (ref 4.22–5.81)
RDW: 13 % (ref 11.5–15.5)
WBC: 7.7 10*3/uL (ref 4.0–10.5)
nRBC: 0 % (ref 0.0–0.2)

## 2021-09-02 LAB — BODY FLUID CULTURE W GRAM STAIN: Culture: NO GROWTH

## 2021-09-02 LAB — HEPARIN LEVEL (UNFRACTIONATED)
Heparin Unfractionated: 0.53 IU/mL (ref 0.30–0.70)
Heparin Unfractionated: 0.45 IU/mL (ref 0.30–0.70)

## 2021-09-02 MED ORDER — SPIRONOLACTONE 25 MG PO TABS
100.0000 mg | ORAL_TABLET | Freq: Every day | ORAL | Status: DC
  Administered 2021-09-02 – 2021-09-07 (×6): 100 mg via ORAL
  Filled 2021-09-02 (×6): qty 4

## 2021-09-02 MED ORDER — POLYETHYLENE GLYCOL 3350 17 G PO PACK
17.0000 g | PACK | Freq: Every day | ORAL | Status: DC | PRN
Start: 2021-09-02 — End: 2021-09-07
  Administered 2021-09-04: 17 g via ORAL
  Filled 2021-09-02: qty 1

## 2021-09-02 MED ORDER — LACTULOSE 10 GM/15ML PO SOLN: 10.0000 g | Freq: Every day | ORAL | Status: DC | PRN

## 2021-09-02 NOTE — Progress Notes (Signed)
? ?PROGRESS NOTE ? ?Gary Maddox INO:[id number] DOB: December 16, 1950 DOA: 08/29/2021 ?PCP: Mancel Bale, PA-C ? ?HPI/Recap of past 24 hours: ?71 y.o. male with medical history significant of EtOH abuse, cognitive disorder, HTN, DM2, untreated chronic hep C, GERD, HLD. Presenting with abdominal distention. He reports that his stomach has been swelling for the last 2 weeks. He hasn't had any pain, N/V or fevers. He's also noticed BLE edema during that time. Again, no pain noted. He didn't try any medications or therapies to help. He denies recent trips or immobility. He visited his PCP the day prior to admission for a regular follow up. After examination, they recommended that he come to the ED for evaluation. He denies any other aggravating or alleviating factors.   ?  ?4/16 s/p paracentesis- -3.8L removed, no evidence of SBP.  He was started on heparin drip due to bilateral lower extremity DVT. ? ?Imaging findings of liver lesions with concern for [place].  AFP elevated.  GI medical oncology consulted to assist with the management. ? ?09/02/21: Patient was seen and examined at his bedside.  His wife was present in the room.  Denies any abdominal pain or nausea.  States he had a good bowel movement yesterday. ? ? ?Assessment/Plan: ?Principal Problem: ?  Ascites ?Active Problems: ?  Chronic hepatitis C virus infection ([place]) ?  ABUSE, ALCOHOL, IN REMISSION ?  Essential hypertension ?  GERD ?  Type 2 diabetes mellitus without complication, without long-term current use of insulin ([place]) ?  Mild cognitive impairment ?  Hyponatremia ?  Moderate protein malnutrition ([place]) ?  Transaminitis ?  HLD (hyperlipidemia) ?  Macrocytic anemia ?  Liver mass ?  Abdominal distention ?  DVT, bilateral lower limbs ([place]) ?  Alcoholic cirrhosis of liver with ascites ([place]) ? ?Alcoholic cirrhosis of liver with ascites ([place]) ?Has Hx of untreated hep C ?AFP elevated ?CT abdomen and pelvis with contrast showing large heterogeneous mass in the right  liver measuring up to 13 cm worrisome for primary hepatocellular carcinoma.  Additional small nodules throughout the liver may represent cirrhosis, but metastatic disease is not excluded ?GI, Dr. Therisa Doyne, and medical oncology, Dr. Lindi Adie has been consulted to assist with the management. ? ?Liver masses with concern for primary hepatocellular carcinoma ?AFP elevated 25 ?Awaiting cytology results from thoracentesis, may consider IR liver biopsy ?  ?B/L LE DVT, bilateral lower limbs ([facility]) ?4/15 [place] BLE demosntrates DVT in R and L ?On Heparin gTT for now considering inpatient workup of liver lesions. Consider transition to eliquis when no procedures planned. ?  ?HLD (hyperlipidemia) ?    - hold statin d/t elevated LFTs ?  ?Hyponatremia ?Plan serum sodium 131 ?Repeat labs ? ?Essential hypertension ?Continue home medications ?S/p Paracentesis, BP stable ?Currently on Norvasc 5 mg daily, Coreg 12.5 mg twice daily and p.o. Lasix 40 mg daily. ?Continue to closely monitor vital signs ?  ?ABUSE, ALCOHOL, IN REMISSION ?No evidence of alcohol withdrawal at time of the visit ?Continue multivitamins, thiamine and folic acid supplement ? ? ?Critical care time: 65 minutes. ?  ?  ?Code Status: full code  ? ?Family Communication: Wife at bedside  ? ?Disposition Plan: Likely will dc to home once GI and medical oncology signed off. ? ? ?Consultants: ?IR  ?GI ?Medical oncology ? ?Procedures: ?Paracentesis  ? ?Antimicrobials: ?None  ? ?DVT prophylaxis: ? None ? ?Status is: Inpatient ? ? ? ? ?Objective: ?Vitals:  ? 09/01/21 1415 09/01/21 2040 09/02/21 0516 09/02/21 0962  ?BP: 106/64 98/76  105/67 115/86  ?Pulse: 76 67 64 63  ?Resp: 20 18 (!) 21   ?Temp: (!) 97.4 ?F (36.3 ?C) 99 ?F (37.2 ?C) 98.2 ?F (36.8 ?C)   ?TempSrc: Oral Oral Oral   ?SpO2: 97% 97% 97%   ?Weight:      ?Height:      ? ? ?Intake/Output Summary (Last 24 hours) at 09/02/2021 1057 ?Last data filed at 09/02/2021 0600 ?Gross per 24 hour  ?Intake 373.98 ml  ?Output 3 ml  ?Net 370.98  ml  ? ?Filed Weights  ? 08/30/21 0300 08/30/21 0416 08/30/21 1937  ?Weight: 72.6 kg 72.6 kg 69.3 kg  ? ? ?Exam: ? ?General: 71 y.o. year-old male frail-appearing in no acute stress.  He is alert and interactive.   ?Cardiovascular: Regular rate and rhythm no rubs or gallops.   ?Respiratory: Clear to auscultation no wheeze or rales.   ?Abdomen: Soft nontender normal bowel sounds present. ?Musculoskeletal: Lower extremity edema bilaterally.   ?Skin: No rashes noted. ?Psychiatry: Mood is appropriate for condition. ? ? ?Data Reviewed: ?CBC: ?Recent Labs  ?Lab 08/29/21 ?1050 08/30/21 ?1037 08/31/21 ?0159 09/01/21 ?0355 09/02/21 ?0336  ?WBC 7.2 7.3 6.9 7.0 7.7  ?NEUTROABS  --  4.5  --   --   --   ?HGB 12.7* 13.6 12.2* 11.9* 12.0*  ?HCT 37.9* 40.0 35.3* 35.2* 34.9*  ?MCV 100.8* 100.0 99.2 99.4 98.9  ?PLT 319 364 333 360 369  ? ?Basic Metabolic Panel: ?Recent Labs  ?Lab 08/29/21 ?1050 08/30/21 ?1037 08/31/21 ?0159  ?NA 130* 134* 131*  ?K 4.0 4.2 3.9  ?CL 96* 97* 99  ?CO2 '27 30 27  '$ ?GLUCOSE 102* 80 84  ?BUN '10 13 13  '$ ?CREATININE 0.59* 0.71 0.53*  ?CALCIUM 8.5* 8.5* 7.8*  ?PHOS  --  3.9  --   ? ?GFR: ?Estimated Creatinine Clearance: 86.6 mL/min (A) (by C-G formula based on SCr of 0.53 mg/dL (L)). ?Liver Function Tests: ?Recent Labs  ?Lab 08/29/21 ?1050 08/29/21 ?2252 08/30/21 ?1037 08/31/21 ?0159  ?AST 72* 64* 62* 62*  ?ALT 39 36 35 33  ?ALKPHOS 215* 199* 196* 164*  ?BILITOT 1.0 1.0 1.1 0.9  ?PROT 6.8 6.5 6.4* 5.7*  ?ALBUMIN 2.4* 2.2* 2.3* 2.1*  ? ?Recent Labs  ?Lab 08/29/21 ?1050 08/30/21 ?1037  ?LIPASE 34  --   ?AMYLASE  --  58  ? ?Recent Labs  ?Lab 08/29/21 ?2252  ?AMMONIA 31  ? ?Coagulation Profile: ?Recent Labs  ?Lab 08/29/21 ?2252  ?INR 1.1  ? ?Cardiac Enzymes: ?No results for input(s): CKTOTAL, CKMB, CKMBINDEX, TROPONINI in the last 168 hours. ?BNP (last 3 results) ?No results for input(s): PROBNP in the last 8760 hours. ?HbA1C: ?No results for input(s): HGBA1C in the last 72 hours. ? ?CBG: ?No results for input(s):  GLUCAP in the last 168 hours. ?Lipid Profile: ?No results for input(s): CHOL, HDL, LDLCALC, TRIG, CHOLHDL, LDLDIRECT in the last 72 hours. ?Thyroid Function Tests: ?No results for input(s): TSH, T4TOTAL, FREET4, T3FREE, THYROIDAB in the last 72 hours. ?Anemia Panel: ?No results for input(s): VITAMINB12, FOLATE, FERRITIN, TIBC, IRON, RETICCTPCT in the last 72 hours. ?Urine analysis: ?   ?Component Value Date/Time  ? COLORURINE AMBER (A) 08/30/2021 1125  ? APPEARANCEUR TURBID (A) 08/30/2021 1125  ? APPEARANCEUR Clear 11/09/2017 1126  ? LABSPEC >1.046 (H) 08/30/2021 1125  ? PHURINE 6.0 08/30/2021 1125  ? GLUCOSEU NEGATIVE 08/30/2021 1125  ? HGBUR NEGATIVE 08/30/2021 1125  ? BILIRUBINUR NEGATIVE 08/30/2021 1125  ? BILIRUBINUR Negative 11/09/2017 1126  ? Benjamin Stain  NEGATIVE 08/30/2021 1125  ? PROTEINUR 30 (A) 08/30/2021 1125  ? UROBILINOGEN 1.0 10/02/2017 1524  ? NITRITE NEGATIVE 08/30/2021 1125  ? LEUKOCYTESUR NEGATIVE 08/30/2021 1125  ? ?Sepsis Labs: ?'@LABRCNTIP'$ (procalcitonin:4,lacticidven:4) ? ?) ?Recent Results (from the past 240 hour(s))  ?MRSA Next Gen by PCR, Nasal     Status: None  ? Collection Time: 08/30/21  2:44 AM  ? Specimen: Nasal Mucosa; Nasal Swab  ?Result Value Ref Range Status  ? MRSA by PCR Next Gen NOT DETECTED NOT DETECTED Final  ?  Comment: (NOTE) ?The GeneXpert MRSA Assay (FDA approved for NASAL specimens only), ?is one component of a comprehensive MRSA colonization surveillance ?program. It is not intended to diagnose MRSA infection nor to guide ?or monitor treatment for MRSA infections. ?Test performance is not FDA approved in patients less than 2 years ?old. ?Performed at [facility], [address] ?[place], [place] [address] ?  ?Body fluid culture w Gram Stain     Status: None (Preliminary result)  ? Collection Time: 08/30/21 10:03 AM  ? Specimen: PATH Cytology Peritoneal fluid  ?Result Value Ref Range Status  ? Specimen Description   Final  ?  PERITONEAL ?Performed at  [facility], [place] [address], [place], [place] [address] ?  ? Special Requests   Final  ?  NONE ?Performed at [facility], [place] [address], [place], [place] [address] ?  ?

## 2021-09-02 NOTE — Consult Note (Signed)
Referring Provider: TRH ?Primary Care Physician:  Mancel Bale, PA-C ?Primary Gastroenterologist:  Althia Forts ? ?Reason for Consultation:  untreated hepatitis C, liver masses with concern for [place] ? ?HPI: Gary Maddox is a 71 y.o. male  Gary Maddox is a 71 y.o. male with medical history significant of EtOH abuse, cognitive disorder, HTN, DM2, chronic hep C, GERD, HLD. Presented to the ED with abdominal distention on 4/14.  ? ?He reports that his stomach has been swelling for the last 2 weeks.  Notes he has not had swelling of his stomach similar to this previously.  He denies abdominal pain with the swelling of his abdomen.  It had worsened when he visited his PCP on 4/14 at that time they recommended ED evaluation.  ? ?He is unsure how long he has had hep C.  ? ?Patient notes he has multiple liquor drinks, approximately 3-4 times a week. ? ?During this hospital stay he had a paracentesis on 4/16 where 3.8 L were removed, no evidence of SBP, awaiting cytology still. Currently on heparin ggt.  ? ?He denies abdominal pain, nausea, vomiting, constipation, diarrhea, melena, hematochezia. ? ?He denies establishing with previous GI or hepatologist.  He has been getting occasional ultrasounds through his PCP.  ? ?RUQ [place] 06/27/2018 ?- Solid lesion in the right lobe of the liver, slightly larger on today's exam. Morphology is not typical for hemangioma and requires further evaluation with cross-sectional imaging for better characterization  ?- Abnormal appearance of the gallbladder with diffusely thickened wall, polyps and possible stones  ?- Small simple appearing cyst right kidney  ?Patient refused further imaging at that time.  ? ?No previous colonoscopy or EGD, denies family history of colon cancer.  ? ? ?Past Medical History:  ?Diagnosis Date  ? Alcohol abuse   ? Back pain   ? Diabetes mellitus without complication ([place])   ? Elevated LDL cholesterol level 10/05/2016  ? Essential hypertension 12/21/2006  ?  Qualifier: Diagnosis of  By: Leward Quan MD, Pamala Hurry    ? GERD 12/21/2006  ? Qualifier: Diagnosis of  By: Leward Quan MD, Pamala Hurry    ? HEPATITIS C, CHRONIC VIRAL, W/O HEPATIC COMA 12/24/2006  ? Qualifier: Diagnosis of  By: Leward Quan MD, Pamala Hurry    ? ? ?Past Surgical History:  ?Procedure Laterality Date  ? ABDOMINAL EXPOSURE N/A 02/23/2018  ? Procedure: ABDOMINAL EXPOSURE;  Surgeon: Rosetta Posner, MD;  Location: [facility];  Service: Vascular;  Laterality: N/A;  ? ANTERIOR LUMBAR FUSION N/A 02/23/2018  ? Procedure: ANTERIOR LUMBAR INTERBODY FUSION LUMBAR 5 - SACRUM 1;  Surgeon: Phylliss Bob, MD;  Location: [facility];  Service: Orthopedics;  Laterality: N/A;  ? BACK SURGERY    ? FOOT SURGERY    ? KNEE SURGERY    ? ? ?Prior to Admission medications   ?Medication Sig Start Date End Date Taking? Authorizing Provider  ?amLODipine (NORVASC) 5 MG tablet Take 5 mg by mouth daily. 06/17/21  Yes [provider]  ?carvedilol (COREG) 12.5 MG tablet TAKE 1 TABLET(12.5 MG) BY MOUTH TWICE DAILY WITH A MEAL ?Patient taking differently: Take 12.5 mg by mouth 2 (two) times daily. 01/04/18  Yes Weber, Sarah L, PA-C  ?diazepam (VALIUM) 5 MG tablet Take 5 mg by mouth daily as needed (for MRI). 12/20/20  Yes [provider]  ?furosemide (LASIX) 40 MG tablet Take 40 mg by mouth daily.   Yes [provider]  ?hydrocortisone (ANUSOL-HC) 2.5 % rectal cream Place 1 application rectally 2 (two) times daily. 03/28/21  Yes [provider]  ?silver sulfADIAZINE (SILVADENE) 1 % cream Apply 1 application. topically daily. Apply to skin graft on right foot 05/01/21  Yes [provider]  ?sodium chloride (OCEAN) 0.65 % SOLN nasal spray Place 1 spray into both nostrils daily.   Yes [provider]  ?tetrahydrozoline 0.05 % ophthalmic solution Place 2 drops into both eyes daily as needed (dry, red eys).   Yes [provider]  ?potassium chloride (KLOR-CON) 10 MEQ tablet Take 10 mEq by mouth 2 (two) times  daily. ?Patient not taking: Reported on 08/30/2021    [provider]  ? ? ?Scheduled Meds: ? amLODipine  5 mg Oral Daily  ? carvedilol  12.5 mg Oral BID  ? feeding supplement  237 mL Oral BID BM  ? folic acid  1 mg Oral Daily  ? furosemide  40 mg Oral Daily  ? hydrocortisone  1 application. Rectal BID  ? multivitamin with minerals  1 tablet Oral Daily  ? pantoprazole  40 mg Oral Daily  ? silver sulfADIAZINE  1 application. Topical Daily  ? sodium chloride  1 spray Each Nare Daily  ? thiamine  100 mg Oral Daily  ? Or  ? thiamine  100 mg Intravenous Daily  ? ?Continuous Infusions: ? heparin 1,000 Units/hr (09/01/21 1903)  ? ?PRN Meds:.naphazoline-glycerin ? ?Allergies as of 08/29/2021  ? (No Known Allergies)  ? ? ?Family History  ?Problem Relation Age of Onset  ? Hypertension Mother   ? Hypertension Father   ? ? ?Social History  ? ?Socioeconomic History  ? Marital status: Married  ?  Spouse name: Not on file  ? Number of children: 8  ? Years of education: Not on file  ? Highest education level: Not on file  ?Occupational History  ? Occupation: sports maintanence  ?Tobacco Use  ? Smoking status: Former  ?  Types: Cigarettes  ? Smokeless tobacco: Never  ? Tobacco comments:  ?  quit a long time ago about 25 years ago  ?Vaping Use  ? Vaping Use: Never used  ?Substance and Sexual Activity  ? Alcohol use: Yes  ?  Comment: 1/2 pint per week  ? Drug use: No  ? Sexual activity: Yes  ?Other Topics Concern  ? Not on file  ?Social History Narrative  ? Lives with wife  ? 2 children -   ? 13 - Grandchildren  ?   ? Seatbelt - 100%  ? Gun in home - no  ?   ? Highest level of education - culinary  trade school  ? 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 ?Social Determinants of Health  ? ?Financial Resource Strain: Not on file  ?Food Insecurity: Not on file  ?Transportation Needs: Not on file  ?Physical Activity: Not on file  ?Stress: Not on file  ?Social Connections: Not on file  ?Intimate Partner Violence: Not on file   ? ? ?Review of Systems: Review of Systems  ?Constitutional:  Negative for chills and fever.  ?HENT:  Negative for hearing loss and tinnitus.   ?Eyes:  Negative for blurred vision and double vision.  ?Cardiovascular:  Positive for leg swelling. Negative for chest pain.  ?Gastrointestinal:  Negative for abdominal pain, blood in stool, constipation, diarrhea, heartburn, melena, nausea and vomiting.  ?     Enlarged abdomen  ?Skin:  Negative for itching and rash.  ?Neurological:  Negative for dizziness and headaches.  ?Endo/Heme/Allergies:  Negative for environmental allergies. Does not bruise/bleed  easily.  ?Psychiatric/Behavioral:  Positive for substance abuse. Negative for depression.   ? ? ?Physical Exam:Physical Exam ?Constitutional:   ?   General: He is not in acute distress. ?   Appearance: He is normal weight.  ?HENT:  ?   Head: Normocephalic and atraumatic.  ?   Right Ear: External ear normal.  ?   Left Ear: External ear normal.  ?   Nose: Nose normal.  ?   Mouth/Throat:  ?   Mouth: Mucous membranes are moist.  ?Eyes:  ?   Conjunctiva/sclera: Conjunctivae normal.  ?   Pupils: Pupils are equal, round, and reactive to light.  ?Cardiovascular:  ?   Rate and Rhythm: Normal rate and regular rhythm.  ?   Pulses: Normal pulses.  ?   Heart sounds: Normal heart sounds.  ?Pulmonary:  ?   Effort: Pulmonary effort is normal.  ?   Breath sounds: Normal breath sounds.  ?Abdominal:  ?   General: Bowel sounds are normal. There is distension.  ?   Palpations: Abdomen is soft.  ?   Tenderness: There is no abdominal tenderness. There is no guarding or rebound.  ?   Hernia: No hernia is present.  ?   Comments: Palpable liver edge  ?Musculoskeletal:     ?   General: Normal range of motion.  ?   Cervical back: Normal range of motion.  ?Skin: ?   General: Skin is warm and dry.  ?   Coloration: Skin is not jaundiced or pale.  ?Neurological:  ?   General: No focal deficit present.  ?   Mental Status: He is alert and oriented to person,  place, and time.  ?Psychiatric:     ?   Mood and Affect: Mood normal.     ?   Behavior: Behavior normal.  ?  ?Vital signs: ?Vitals:  ? 09/02/21 0516 09/02/21 0822  ?BP: 105/67 115/86  ?Pulse: 64 63  ?Resp:

## 2021-09-02 NOTE — Progress Notes (Signed)
ANTICOAGULATION CONSULT NOTE  ? ?Pharmacy Consult for heparin ?Indication: DVT ? ?No Known Allergies ? ?Patient Measurements: ?Height: 6' (182.9 cm) ?Weight: 69.3 kg (152 lb 12.5 oz) ?IBW/kg (Calculated) : 77.6 ?Heparin Dosing Weight: 72.6 kg ? ?Vital Signs: ?Temp: 98.2 ?F (36.8 ?C) (04/18 0516) ?Temp Source: Oral (04/18 0516) ?BP: 115/86 (04/18 5027) ?Pulse Rate: 63 (04/18 0822) ? ?Labs: ?Recent Labs  ?  08/31/21 ?0159 08/31/21 ?7412 09/01/21 ?0355 09/01/21 ?1058 09/01/21 ?1657 09/02/21 ?0336 09/02/21 ?1105 09/02/21 ?1205  ?HGB 12.2*  --  11.9*  --   --  12.0*  --   --   ?HCT 35.3*  --  35.2*  --   --  34.9*  --   --   ?PLT 333  --  360  --   --  369  --   --   ?HEPARINUNFRC 0.33   < > 0.71*   < > 0.86* 0.53 0.45  --   ?CREATININE 0.53*  --   --   --   --   --   --  0.77  ? < > = values in this interval not displayed.  ? ? ? ?Estimated Creatinine Clearance: 86.6 mL/min (by C-G formula based on SCr of 0.77 mg/dL). ? ?Medications:  ?Scheduled:  ? amLODipine  5 mg Oral Daily  ? carvedilol  12.5 mg Oral BID  ? feeding supplement  237 mL Oral BID BM  ? folic acid  1 mg Oral Daily  ? furosemide  40 mg Oral Daily  ? hydrocortisone  1 application. Rectal BID  ? multivitamin with minerals  1 tablet Oral Daily  ? pantoprazole  40 mg Oral Daily  ? silver sulfADIAZINE  1 application. Topical Daily  ? sodium chloride  1 spray Each Nare Daily  ? thiamine  100 mg Oral Daily  ? Or  ? thiamine  100 mg Intravenous Daily  ? ? ?Assessment: ?71 yo male presenting with abdominal distension, concern for liver mass on imaging.  Also presenting with BLE edema.  No anticoagulation PTA.  Dopplers positive for bilateral DVT's.  Patient is s/p paracentesis on 4/15 - discussed with IR and ok to begin heparin drip now for VTE treatment. ? ?Baseline labs: INR 1.1, PT 13.6, Hgb 12.7, Plts 319 ? ?Today, 09/02/21 ?- Heparin level = 0.45 (therapeutic) with heparin gtt @ 1000 units/hr  ?- CBC remains stable- Hg slightly low at 12, pltc WNL ?- No  complications of therapy noted ? ?Goal of Therapy:  ?Heparin level 0.3-0.7 units/ml ?Monitor platelets by anticoagulation protocol: Yes ?  ?Plan:  ?Continue heparin infusion @ 1000 units/hr ?Heparin level, CBC, s/sx bleeding ? ? ?Royetta Asal, PharmD, BCPS ?09/02/2021 1:18 PM ? ? ? ? ?

## 2021-09-02 NOTE — Progress Notes (Addendum)
HEMATOLOGY-ONCOLOGY PROGRESS NOTE  ASSESSMENT AND PLAN: 1.  Liver mass 2.  Large volume ascites 3.  Cirrhosis 4.  Chronic hepatitis C, untreated 5.  Anemia 6.  Bilateral lower extremity DVT 7.  Hypertension 8.  Hyperlipidemia 9.  Hyponatremia 10.  History of alcohol abuse  -Discussed imaging findings and lab results with the patient and his wife.  AFP is mildly elevated.  We discussed that findings are concerning for malignancy including HCC. -Following paracentesis, fluid has been sent for cytology.  These results are pending.  We will follow-up on cytology results when they are available. -Recommend MRI of the liver with and without contrast to better characterize the liver mass.  If MRI of the liver is definitive for [name], may not need any additional biopsies.  -Continue heparin drip for now.  We can consider switching him to DOAC but will need to be cautious in the setting of cirrhosis.  Xarelto may be a better option than Eliquis in the situation.  If unable to take DOAC, would consider Lovenox. -We will arrange for outpatient follow-up with one of our GI oncologist following hospital discharge.  I have alerted the GI navigator about this patient.  Clenton Pare, DNP, AGPCNP-BC, AOCNP  SUBJECTIVE: Mr. Gary Maddox is a 71 year old male with a past medical history significant for alcohol abuse, cognitive disorder, hypertension, diabetes mellitus, chronic hepatitis C, GERD, hyperlipidemia.  He presented to the emergency department with abdominal distention.  Stomach has been swelling for the past 2 weeks.  He has also been experiencing bilateral lower extremity edema.  He was seen by his primary care provider the day prior to admission who advised emergency department evaluation.  On admission, his hemoglobin was 12.7, MCV 100.8, sodium 130, calcium 8.5, creatinine 0.59, albumin 2.4, AST 72, alk phos 215.  He had a CT of the abdomen/pelvis with contrast performed on admission which showed a  large heterogeneous mass in the right liver measuring up to 13 cm worrisome for primary HCC, additional numerous small nodules throughout the liver may represent cirrhosis fifth generating nodules but metastatic disease cannot be excluded.  He underwent an ultrasound-guided paracentesis on 08/30/2021 with 3.8 L of fluid removed.  Cytology pending.  AFP was obtained on 08/30/2021 and was mildly elevated at 25.2.  The patient was seen and his wife was at the bedside.  He reports fatigue.  He has had a very poor appetite and has lost a fair amount of weight recently.  Abdominal distention feels better after paracentesis.  He is not having any nausea, vomiting, constipation, diarrhea.  Denies fevers, chills, headaches, dizziness, chest pain, shortness of breath.  He has ongoing lower extremity edema.  No bleeding reported.  The patient is married and has 4 children.  Family history significant for a niece with a brain tumor and a son that is being treated for cancer but he is not sure what kind of cancer he has.  The patient reports that he quit smoking about 20 years ago.  Stated that he smoked less than 1 pack/day.  When asked about alcohol use, the patient reports that he continues to drink "a little."  He states that he is not drink daily and that he has never been a heavy drinker.  Medical oncology was asked see the patient make recommendations regarding his liver mass.  REVIEW OF SYSTEMS:   Review of Systems  Constitutional:  Positive for malaise/fatigue and weight loss. Negative for chills and fever.  HENT: Negative.    Eyes:  Negative.   Respiratory: Negative.    Cardiovascular:  Positive for leg swelling. Negative for chest pain.  Gastrointestinal:  Negative for abdominal pain, constipation, diarrhea, nausea and vomiting.       Reports abdominal distention  Musculoskeletal:  Positive for back pain.       Reports longstanding history of back pain and was due to have surgery on his back in the near  future.  Skin: Negative.   Neurological: Negative.   Psychiatric/Behavioral: Negative.     I have reviewed the past medical history, past surgical history, social history and family history with the patient and they are unchanged from previous note.   PHYSICAL EXAMINATION: ECOG PERFORMANCE STATUS: 2 - Symptomatic, <50% confined to bed  Vitals:   09/02/21 0516 09/02/21 0822  BP: 105/67 115/86  Pulse: 64 63  Resp: (!) 21   Temp: 98.2 F (36.8 C)   SpO2: 97%    Filed Weights   08/30/21 0300 08/30/21 0416 08/30/21 1937  Weight: 72.6 kg 72.6 kg 69.3 kg    Intake/Output from previous day: 04/17 0701 - 04/18 0700 In: 374 [P.O.:120; I.V.:254] Out: 3 [Urine:1; Stool:2]  Physical Exam Vitals reviewed.  Constitutional:      General: He is not in acute distress.    Appearance: He is cachectic.  HENT:     Head: Normocephalic.     Mouth/Throat:     Pharynx: No oropharyngeal exudate or posterior oropharyngeal erythema.  Eyes:     General: No scleral icterus.    Conjunctiva/sclera: Conjunctivae normal.  Cardiovascular:     Rate and Rhythm: Normal rate and regular rhythm.  Pulmonary:     Effort: Pulmonary effort is normal. No respiratory distress.     Breath sounds: Normal breath sounds.  Abdominal:     General: Bowel sounds are normal.     Palpations: Abdomen is soft.     Comments: Ascites present  Skin:    General: Skin is warm and dry.  Neurological:     Mental Status: He is alert and oriented to person, place, and time.    LABORATORY DATA:  I have reviewed the data as listed    Latest Ref Rng & Units 08/31/2021    1:59 AM 08/30/2021   10:37 AM 08/29/2021   10:52 PM  CMP  Glucose 70 - 99 mg/dL 84   80     BUN 8 - 23 mg/dL 13   13     Creatinine 0.61 - 1.24 mg/dL 6.96   2.95     Sodium 135 - 145 mmol/L 131   134     Potassium 3.5 - 5.1 mmol/L 3.9   4.2     Chloride 98 - 111 mmol/L 99   97     CO2 22 - 32 mmol/L 27   30     Calcium 8.9 - 10.3 mg/dL 7.8   8.5      Total Protein 6.5 - 8.1 g/dL 5.7   6.4   6.5    Total Bilirubin 0.3 - 1.2 mg/dL 0.9   1.1   1.0    Alkaline Phos 38 - 126 U/L 164   196   199    AST 15 - 41 U/L 62   62   64    ALT 0 - 44 U/L 33   35   36      Lab Results  Component Value Date   WBC 7.7 09/02/2021   HGB 12.0 (L) 09/02/2021  HCT 34.9 (L) 09/02/2021   MCV 98.9 09/02/2021   PLT 369 09/02/2021   NEUTROABS 4.5 08/30/2021    No results found for: CEA1, CEA, CAN199, CA125, PSA1  CT ABDOMEN PELVIS W CONTRAST  Result Date: 08/29/2021 CLINICAL DATA:  Acute abdominal pain.  Ascites. EXAM: CT ABDOMEN AND PELVIS WITH CONTRAST TECHNIQUE: Multidetector CT imaging of the abdomen and pelvis was performed using the standard protocol following bolus administration of intravenous contrast. RADIATION DOSE REDUCTION: This exam was performed according to the departmental dose-optimization program which includes automated exposure control, adjustment of the mA and/or kV according to patient size and/or use of iterative reconstruction technique. CONTRAST:  OMNIPAQUE IOHEXOL 300 MG/ML  SOLN COMPARISON:  Ultrasound abdomen 06/27/2019. FINDINGS: Lower chest: No acute abnormality. Hepatobiliary: Liver is diffusely heterogeneous, enlarged, with nodular liver contour. Ill-defined heterogeneous mass is seen in the right liver with central areas of hypodensity measuring approximally 13 x 11 x 13 cm. There are additional ill-defined smaller nodular densities throughout the right lobe of the liver. These measure up to proximally 3.5 cm. Gallstones are present. There is no definite biliary ductal dilatation. Pancreas: Unremarkable. No pancreatic ductal dilatation or surrounding inflammatory changes. Spleen: Normal in size without focal abnormality. Adrenals/Urinary Tract: There is an indeterminate left adrenal nodule measuring 2.4 x 1.5 cm. The left adrenal gland is within normal limits. There is no hydronephrosis. There cyst in the superior pole the  right kidney measuring up to 11 mm. Bladder is within normal limits. Stomach/Bowel: Stomach is within normal limits. Appendix appears normal. No evidence of bowel wall thickening, distention, or inflammatory changes. Vascular/Lymphatic: Aortic atherosclerosis. No enlarged abdominal or pelvic lymph nodes. Portal vein patency can not be confirmed on this study. Reproductive: Prostate is unremarkable. Other: There is a very large amount of ascites throughout the abdomen and pelvis. There is no free air or focal abdominal wall hernia. Musculoskeletal: L5-S1 posterior and anterior fusion hardware are present. There are degenerative changes throughout the spine. IMPRESSION: 1. Large heterogeneous mass in the right liver measuring up to 13 cm worrisome for primary hepatocellular carcinoma. 2. Additional numerous smaller nodules throughout the liver may represent cirrhosis fifth regenerating nodules, but metastatic disease not excluded. 3. Portal vein patency can not be confirmed on this study. Consider further evaluation with ultrasound. 4. Large volume ascites. 5. Cholelithiasis. 6.  Aortic Atherosclerosis (ICD10-I70.0). Electronically Signed   By: Darliss Cheney M.D.   On: 08/29/2021 23:48   US Paracentesis  Result Date: 08/30/2021 INDICATION: History of ETOH abuse, hepatitis-C presented to ED complaining of abdominal distention. Patient was found to have large volume ascites with large liver mass. Request for diagnostic and therapeutic paracentesis with a limit of 4 L. EXAM: ULTRASOUND GUIDED DIAGNOSTIC AND THERAPEUTIC LEFT LOWER QUADRANT PARACENTESIS MEDICATIONS: 10 mL 1 % lidocaine COMPLICATIONS: None immediate. PROCEDURE: Informed written consent was obtained from the patient after a discussion of the risks, benefits and alternatives to treatment. A timeout was performed prior to the initiation of the procedure. Initial ultrasound scanning demonstrates a large amount of ascites within the left lower abdominal  quadrant. The left lower abdomen was prepped and draped in the usual sterile fashion. 1% lidocaine was used for local anesthesia. Following this, a 19 gauge, 7-cm, Yueh catheter was introduced. An ultrasound image was saved for documentation purposes. The paracentesis was performed. The catheter was removed and a dressing was applied. The patient tolerated the procedure well without immediate post procedural complication. Patient received post-procedure intravenous albumin; see nursing notes  for details. FINDINGS: A total of approximately 3.8 L of clear, yellow fluid was removed. Samples were sent to the laboratory as requested by the clinical team. IMPRESSION: Successful ultrasound-guided paracentesis yielding 3.8 liters of peritoneal fluid. Read by: Alex Gardener, AGNP-BC Electronically Signed   By: Judie Petit.  Shick M.D.   On: 08/30/2021 11:12   VAS [place] LOWER EXTREMITY VENOUS (DVT)  Result Date: 08/30/2021  Lower Venous DVT Study Patient Name:  THORVALD CAROTENUTO  Date of Exam:   08/30/2021 Medical Rec #: [id number]         Accession #:    [id number] Date of Birth: 1950/07/08         Patient Gender: M Patient Age:   38 years Exam Location[facility]  [facility] Procedure:      VAS [place] LOWER EXTREMITY VENOUS (DVT) Referring Phys: Margie Ege --------------------------------------------------------------------------------  Indications: Swelling.  Risk Factors: None identified. Limitations: Poor ultrasound/tissue interface. Comparison Study: No prior studies. Performing Technologist: Chanda Busing RVT  Examination Guidelines: A complete evaluation includes B-mode imaging, spectral Doppler, color Doppler, and power Doppler as needed of all accessible portions of each vessel. Bilateral testing is considered an integral part of a complete examination. Limited examinations for reoccurring indications may be performed as noted. The reflux portion of the exam is performed with the patient in reverse Trendelenburg.   +---------+---------------+---------+-----------+----------+--------------+ RIGHT    CompressibilityPhasicitySpontaneityPropertiesThrombus Aging +---------+---------------+---------+-----------+----------+--------------+ CFV      Full           Yes      Yes                                 +---------+---------------+---------+-----------+----------+--------------+ SFJ      Full                                                        +---------+---------------+---------+-----------+----------+--------------+ FV Prox  Full                                                        +---------+---------------+---------+-----------+----------+--------------+ FV Mid   Full                                                        +---------+---------------+---------+-----------+----------+--------------+ FV DistalFull                                                        +---------+---------------+---------+-----------+----------+--------------+ PFV      Full                                                        +---------+---------------+---------+-----------+----------+--------------+  POP      Full           Yes      Yes                                 +---------+---------------+---------+-----------+----------+--------------+ PTV      Partial                                      Acute          +---------+---------------+---------+-----------+----------+--------------+ PERO     Full                                                        +---------+---------------+---------+-----------+----------+--------------+ Gastroc  Partial                                      Acute          +---------+---------------+---------+-----------+----------+--------------+   +---------+---------------+---------+-----------+----------+--------------+ LEFT     CompressibilityPhasicitySpontaneityPropertiesThrombus Aging  +---------+---------------+---------+-----------+----------+--------------+ CFV      Partial        Yes      Yes                  Acute          +---------+---------------+---------+-----------+----------+--------------+ SFJ      Full                                                        +---------+---------------+---------+-----------+----------+--------------+ FV Prox  Full           Yes      Yes                                 +---------+---------------+---------+-----------+----------+--------------+ FV Mid   Full                                                        +---------+---------------+---------+-----------+----------+--------------+ FV DistalFull                                                        +---------+---------------+---------+-----------+----------+--------------+ PFV      None           No       No                   Acute          +---------+---------------+---------+-----------+----------+--------------+ POP      Full           Yes      Yes                                 +---------+---------------+---------+-----------+----------+--------------+  PTV      Full                                                        +---------+---------------+---------+-----------+----------+--------------+ PERO     Full                                                        +---------+---------------+---------+-----------+----------+--------------+ EIV      Partial        Yes      Yes                  Acute          +---------+---------------+---------+-----------+----------+--------------+ CIV                     Yes      Yes                                 +---------+---------------+---------+-----------+----------+--------------+ The common iliac vein appears patent.    Summary: RIGHT: - Findings consistent with acute deep vein thrombosis involving the right posterior tibial veins, and right gastrocnemius veins. - No cystic  structure found in the popliteal fossa.  LEFT: - Findings consistent with acute deep vein thrombosis involving the left external iliac vein, left common femoral vein, and left proximal profunda vein. - No cystic structure found in the popliteal fossa.  *See table(s) above for measurements and observations. Electronically signed by Waverly Ferrari MD on 08/30/2021 at 5:15:42 PM.    Final    US Abdomen Limited RUQ (LIVER/GB)  Result Date: 08/30/2021 CLINICAL DATA:  Concern for portal vein obstruction EXAM: ULTRASOUND ABDOMEN LIMITED RIGHT UPPER QUADRANT COMPARISON:  CT abdomen pelvis previous day FINDINGS: Gallbladder: Cholelithiasis.  No sonographic Murphy sign noted by sonographer. Common bile duct: Diameter: 0.4 cm Liver: The liver demonstrates heterogeneous echotexture with nodular liver contours. Again seen is a large heterogeneous mass in the right hepatic lobe, measuring 13 cm in size. A second heterogeneous mass in the left lobe is identified, measuring up to 3.5 cm. These masses are consistent with recent cross-sectional imaging. Portal vein appears patent but demonstrates bidirectional blood flow to and from the liver on color Doppler imaging. Other: Large volume ascites. IMPRESSION: 1. Known multiple liver masses, better assessed on recent cross-sectional imaging, in a background of cirrhosis are concerning for hepatocellular carcinoma. Differential considerations include metastatic disease. 2. Cirrhotic liver with large volume ascites. The portal vein appears patent with bidirectional flow. Findings are consistent with portal venous hypertension. 3. Cholelithiasis. Electronically Signed   By: Olive Bass M.D.   On: 08/30/2021 13:21     No future appointments.    LOS: 3 days   Attending Note  I personally saw the patient, reviewed the chart and examined the patient. The plan of care was discussed with the patient. I agree with the assessment and plan as documented above. Thank you very  much for the consultation. Massive tumors in the liver with ascites: Status post paracentesis awaiting cytology.  With a history of hepatitis C and a 13 cm tumor  in the right hepatic lobe and additional lesion in the left hepatic lobe 3.5 cm, it is concerning for primary hepatocellular carcinoma. AFP is only mildly increased to 25.2. We will obtain a liver MRI for further assessment. Prognosis: I discussed with the patient and his wife that with his performance status, diagnosis of hepatocellular carcinoma is considered to be a poor prognostic situation.  The tumor cannot be resected and therefore options will be systemic treatments.  He expressed that he may not be interested in receiving those treatments but will discuss this with his family further. If the patient decides not to pursue any treatment then he could be considered for hospice care. I anticipate that he would have a very short life expectancy as a matter of few weeks if nothing is done.

## 2021-09-02 NOTE — Evaluation (Signed)
Physical Therapy Evaluation ?Patient Details ?Name: Cutter Passey ?MRN: [id number] ?DOB: [birth year] ?Today's Date: 09/02/2021 ? ?History of Present Illness ? Patient is 71 y.o. male presented to ED for abdominal distention with stomach swelling that has increased over the last 2 weeks. 4/16 s/p paracentesis 3.8L removed. He was started on heparin drip due to bilateral lower extremity DVT, abdominal CT findings of liver lesions with concern for [place]. PMH significant for ETOH abuse, HTN, DM2, untreated chronic hep C, GERD, HLD, back pain, ?  ?Clinical Impression ? Victorious Kundinger is 71 y.o. male admitted with above HPI and diagnosis. Patient is currently limited by functional impairments below (see PT problem list). Patient lives with his wife and reports mod independence with Rollator for mobility at baseline and independence with ADL's. Patient very resistant to mobilizing and required strong encouragement/direction to move. Pt stating "I'll have to pee in bed" when instructed to sit up to use urinal rather than lay down for it. Pt sat up despite irritability. Pt instructed to stand and completed lateral scoot instead and then redirected to complete sit<>stand. Patient made multiple requests/demands and this therapist encouraged and educated pt on importance of independence and on mobilizing with RN/NT staff. Pt's wife arrived at [place] and echoed concerns for pt not being willing to mobilize and encouraged pt to do so. Patient will benefit from continued skilled PT interventions to address impairments and progress independence with mobility, recommending HHPT and assist from spouse as needed. Acute PT will follow and progress as able.    ?   ? ?Recommendations for follow up therapy are one component of a multi-disciplinary discharge planning process, led by the attending physician.  Recommendations may be updated based on patient status, additional functional criteria and insurance authorization. ? ?Follow Up  Recommendations Home health PT ? ?  ?Assistance Recommended at Discharge Frequent or constant Supervision/Assistance  ?Patient can return home with the following ? A little help with walking and/or transfers;A little help with bathing/dressing/bathroom;Assistance with cooking/housework;Direct supervision/assist for financial management;Direct supervision/assist for medications management;Assist for transportation;Help with stairs or ramp for entrance ? ?  ?Equipment Recommendations None recommended by PT  ?Recommendations for Other Services ?    ?  ?Functional Status Assessment Patient has had a recent decline in their functional status and demonstrates the ability to make significant improvements in function in a reasonable and predictable amount of time.  ? ?  ?Precautions / Restrictions Precautions ?Precautions: Fall ?Restrictions ?Weight Bearing Restrictions: No  ? ?  ? ?Mobility ? Bed Mobility ?Overal bed mobility: Needs Assistance ?Bed Mobility: Supine to Sit, Sit to Supine ?  ?  ?Supine to sit: Min guard ?Sit to supine: Min guard ?  ?General bed mobility comments: pt resistant to mobilize and required MAX encouragement/direction to sit up at EOB. Pt requesting help with all aspects of mobility but able to complete with guarding only and no assist. Pt using bed rail to control raising/lowering trunk. pt able to raise LEs onto bed without assist at EOS when returning to supine. ?  ? ?Transfers ?Overall transfer level: Needs assistance ?Equipment used: Rolling walker (2 wheels) ?Transfers: Sit to/from Stand, Bed to chair/wheelchair/BSC ?Sit to Stand: Min guard, From elevated surface ?  ?  ?  ?  ? Lateral/Scoot Transfers: Min guard, From elevated surface ?General transfer comment: cues for pt to stand wtih RW. pt requesting to move walker ahead slightly and then completing lateral scoot ignoring cues to stand. Walker repositioned and pt encouraged to stand again,  he complete with bil UE use to power up and min  guard for safety. ?  ? ?Ambulation/Gait ?  ?  ?  ?  ?  ?  ?Pre-gait activities: pt took small steps latearlly at EOB and declining to move in room further or up to chair. ?  ? ?Stairs ?  ?  ?  ?  ?  ? ?Wheelchair Mobility ?  ? ?Modified Rankin (Stroke Patients Only) ?  ? ?  ? ?Balance Overall balance assessment: Needs assistance ?Sitting-balance support: Feet supported ?Sitting balance-Leahy Scale: Good ?  ?  ?Standing balance support: Reliant on assistive device for balance, During functional activity ?Standing balance-Leahy Scale: Poor ?Standing balance comment: resistant to testing ?  ?  ?  ?  ?  ?  ?  ?  ?  ?  ?  ?   ? ? ? ?Pertinent Vitals/Pain Pain Assessment ?Pain Assessment: No/denies pain  ? ? ?Home Living Family/patient expects to be discharged to:: Private residence ?Living Arrangements: Spouse/significant other ?Available Help at Discharge: Family;Available PRN/intermittently ?Type of Home: House ?Home Access: Stairs to enter ?Entrance Stairs-Rails: None ?Entrance Stairs-Number of Steps: 4 ?  ?Home Layout: One level ?Home Equipment: Rollator (4 wheels);Cane - single point ?   ?  ?Prior Function Prior Level of Function : Independent/Modified Independent;Needs assist ?  ?  ?  ?  ?  ?  ?Mobility Comments: amb with rollator, needing some assist recently per wife ?ADLs Comments: assist with LB dressing, showers for a few mos ?  ? ? ?Hand Dominance  ? Dominant Hand: Right ? ?  ?Extremity/Trunk Assessment  ? Upper Extremity Assessment ?Upper Extremity Assessment: Overall WFL for tasks assessed ?  ? ?Lower Extremity Assessment ?Lower Extremity Assessment: Generalized weakness ?  ? ?Cervical / Trunk Assessment ?Cervical / Trunk Assessment: Normal  ?Communication  ? Communication: No difficulties  ?Cognition Arousal/Alertness: Awake/alert ?Behavior During Therapy: [facility] for tasks assessed/performed ?Overall Cognitive Status: Within Functional Limits for tasks assessed ?  ?  ?  ?  ?  ?  ?  ?  ?  ?  ?  ?  ?  ?  ?  ?   ?  ?  ?  ? ?  ?General Comments   ? ?  ?Exercises    ? ?Assessment/Plan  ?  ?PT Assessment Patient needs continued PT services  ?PT Problem List Decreased strength;Decreased activity tolerance;Decreased balance;Decreased mobility;Decreased knowledge of use of DME;Decreased safety awareness;Decreased knowledge of precautions ? ?   ?  ?PT Treatment Interventions DME instruction;Gait training;Stair training;Functional mobility training;Therapeutic activities;Therapeutic exercise;Balance training;Neuromuscular re-education;Patient/family education   ? ?PT Goals (Current goals can be found in the Care Plan section)  ?Acute Rehab PT Goals ?Patient Stated Goal: go home ?PT Goal Formulation: With patient ?Time For Goal Achievement: 09/16/21 ?Potential to Achieve Goals: Fair ? ?  ?Frequency Min 3X/week ?  ? ? ?Co-evaluation   ?  ?  ?  ?  ? ? ?  ?AM-PAC PT "6 Clicks" Mobility  ?Outcome Measure Help needed turning from your back to your side while in a flat bed without using bedrails?: A Little ?Help needed moving from lying on your back to sitting on the side of a flat bed without using bedrails?: A Little ?Help needed moving to and from a bed to a chair (including a wheelchair)?: A Little ?Help needed standing up from a chair using your arms (e.g., wheelchair or bedside chair)?: A Little ?Help needed to walk in hospital room?: A  Little ?Help needed climbing 3-5 steps with a railing? : A Lot ?6 Click Score: 17 ? ?  ?End of Session Equipment Utilized During Treatment: Gait belt ?Activity Tolerance: Patient tolerated treatment well (self limiting) ?Patient left: in bed;with family/visitor present;with call bell/phone within reach ?Nurse Communication: Mobility status ?PT Visit Diagnosis: Unsteadiness on feet (R26.81);Muscle weakness (generalized) (M62.81);Difficulty in walking, not elsewhere classified (R26.2) ?  ? ?Time: 1207-1232 ?PT Time Calculation (min) (ACUTE ONLY): 25 min ? ? ?Charges:   PT Evaluation ?$PT Eval Low  Complexity: 1 Low ?PT Treatments ?$Therapeutic Activity: 8-22 mins ?  ?   ? ? ?Gwynneth Albright PT, DPT ?Acute Rehabilitation Services ?Office [phone] ?Pager [phone]  ? ? ?Jacques Navy ?09/02/2021, 1:12 PM

## 2021-09-02 NOTE — Progress Notes (Signed)
ANTICOAGULATION CONSULT NOTE  ? ?Pharmacy Consult for heparin ?Indication: DVT ? ?No Known Allergies ? ?Patient Measurements: ?Height: 6' (182.9 cm) ?Weight: 69.3 kg (152 lb 12.5 oz) ?IBW/kg (Calculated) : 77.6 ?Heparin Dosing Weight: 72.6 kg ? ?Vital Signs: ?Temp: 99 ?F (37.2 ?C) (04/17 2040) ?Temp Source: Oral (04/17 2040) ?BP: 98/76 (04/17 2040) ?Pulse Rate: 67 (04/17 2040) ? ?Labs: ?Recent Labs  ?  08/30/21 ?1037 08/30/21 ?1822 08/31/21 ?0159 08/31/21 ?3354 09/01/21 ?0355 09/01/21 ?1058 09/01/21 ?1657 09/02/21 ?0336  ?HGB 13.6  --  12.2*  --  11.9*  --   --  12.0*  ?HCT 40.0  --  35.3*  --  35.2*  --   --  34.9*  ?PLT 364  --  333  --  360  --   --  369  ?HEPARINUNFRC  --    < > 0.33   < > 0.71* 0.68 0.86* 0.53  ?CREATININE 0.71  --  0.53*  --   --   --   --   --   ? < > = values in this interval not displayed.  ? ? ? ?Estimated Creatinine Clearance: 86.6 mL/min (A) (by C-G formula based on SCr of 0.53 mg/dL (L)). ? ?Medications:  ?Scheduled:  ? amLODipine  5 mg Oral Daily  ? carvedilol  12.5 mg Oral BID  ? Chlorhexidine Gluconate Cloth  6 each Topical Daily  ? feeding supplement  237 mL Oral BID BM  ? folic acid  1 mg Oral Daily  ? furosemide  40 mg Oral Daily  ? hydrocortisone  1 application. Rectal BID  ? multivitamin with minerals  1 tablet Oral Daily  ? pantoprazole  40 mg Oral Daily  ? silver sulfADIAZINE  1 application. Topical Daily  ? sodium chloride  1 spray Each Nare Daily  ? thiamine  100 mg Oral Daily  ? Or  ? thiamine  100 mg Intravenous Daily  ? ? ?Assessment: ?71 yo male presenting with abdominal distension, concern for liver mass on imaging.  Also presenting with BLE edema.  No anticoagulation PTA.  Dopplers positive for bilateral DVT's.  Patient is s/p paracentesis on 4/15 - discussed with IR and ok to begin heparin drip now for VTE treatment. ? ?Baseline labs: INR 1.1, PT 13.6, Hgb 12.7, Plts 319 ? ?Today, 09/02/21 ?- Heparin level = 0.53 (therapeutic) with heparin gtt @ 1000 units/hr  ?- CBC  remains stable- Hg slightly low at 12, pltc WNL ?- No complications of therapy noted ? ?Goal of Therapy:  ?Heparin level 0.3-0.7 units/ml ?Monitor platelets by anticoagulation protocol: Yes ?  ?Plan:  ?Continue heparin infusion @ 1000 units/hr ?Recheck heparin level in 6h to confirm therapeutic dose ?Monitor daily heparin level, CBC, s/sx bleeding ? ?Leone Haven, PharmD ?09/02/2021, 4:56 AM ? ? ? ?

## 2021-09-02 NOTE — Plan of Care (Signed)

## 2021-09-03 DIAGNOSIS — R188 Other ascites: Secondary | ICD-10-CM | POA: Diagnosis not present

## 2021-09-03 DIAGNOSIS — C220 Liver cell carcinoma: Secondary | ICD-10-CM | POA: Diagnosis present

## 2021-09-03 LAB — CBC
HCT: 35.2 % — ABNORMAL LOW (ref 39.0–52.0)
Hemoglobin: 11.8 g/dL — ABNORMAL LOW (ref 13.0–17.0)
MCH: 33.1 pg (ref 26.0–34.0)
MCHC: 33.5 g/dL (ref 30.0–36.0)
MCV: 98.9 fL (ref 80.0–100.0)
Platelets: 376 10*3/uL (ref 150–400)
RBC: 3.56 MIL/uL — ABNORMAL LOW (ref 4.22–5.81)
RDW: 12.8 % (ref 11.5–15.5)
WBC: 9.1 10*3/uL (ref 4.0–10.5)
nRBC: 0 % (ref 0.0–0.2)

## 2021-09-03 LAB — COMPREHENSIVE METABOLIC PANEL
ALT: 41 U/L (ref 0–44)
AST: 76 U/L — ABNORMAL HIGH (ref 15–41)
Albumin: 2.1 g/dL — ABNORMAL LOW (ref 3.5–5.0)
Alkaline Phosphatase: 165 U/L — ABNORMAL HIGH (ref 38–126)
Anion gap: 6 (ref 5–15)
BUN: 11 mg/dL (ref 8–23)
CO2: 29 mmol/L (ref 22–32)
Calcium: 7.9 mg/dL — ABNORMAL LOW (ref 8.9–10.3)
Chloride: 95 mmol/L — ABNORMAL LOW (ref 98–111)
Creatinine, Ser: 0.61 mg/dL (ref 0.61–1.24)
GFR, Estimated: >60 mL/min (ref >60)
Glucose, Bld: 96 mg/dL (ref 70–99)
Potassium: 3.5 mmol/L (ref 3.5–5.1)
Sodium: 130 mmol/L — ABNORMAL LOW (ref 135–145)
Total Bilirubin: 1 mg/dL (ref 0.3–1.2)
Total Protein: 5.9 g/dL — ABNORMAL LOW (ref 6.5–8.1)

## 2021-09-03 LAB — PHOSPHORUS: Phosphorus: 2.6 mg/dL (ref 2.5–4.6)

## 2021-09-03 LAB — HCV RNA, QUANTITATIVE REFLEX
HCV RNA - Quantitation: 1720000 IU/mL
HCV RNA - log10: 6.236 log10 IU/mL

## 2021-09-03 LAB — HCV RNA DIAGNOSIS, NAA

## 2021-09-03 LAB — CYTOLOGY - NON PAP

## 2021-09-03 LAB — HEPARIN LEVEL (UNFRACTIONATED)
Heparin Unfractionated: 0.2 IU/mL — ABNORMAL LOW (ref 0.30–0.70)
Heparin Unfractionated: 0.41 IU/mL (ref 0.30–0.70)

## 2021-09-03 LAB — MAGNESIUM: Magnesium: 1.6 mg/dL — ABNORMAL LOW (ref 1.7–2.4)

## 2021-09-03 MED ORDER — TRAMADOL HCL 50 MG PO TABS
50.0000 mg | ORAL_TABLET | Freq: Once | ORAL | Status: AC
  Administered 2021-09-03: 50 mg via ORAL
  Filled 2021-09-03: qty 1

## 2021-09-03 MED ORDER — GADOBUTROL 1 MMOL/ML IV SOLN
7.5000 mL | Freq: Once | INTRAVENOUS | Status: AC | PRN
  Administered 2021-09-03: 7.5 mL via INTRAVENOUS

## 2021-09-03 NOTE — Progress Notes (Signed)
PHARMACY - HEPARIN (brief note) ? ?Heparin infusion for DVT ?Heparin gtt currently @ 1000 units/hr ? ?Heparin level this AM (@ 04:12) = 0.2 (subtherapeutic)  ?The two previous heparin levels at the same rate were therapeutic. ? ?Spoke with RN who relayed patient went to CT around midnight and heparin was turned off during the scan.  Heparin infusion restarted at the end of scan.  RN was not with patient so is not sure of timing. ? ?Will continue heparin @ 1000 units/hr for now and will recheck heparin level @ 8am (6 hr after heparin infusion known to be running at current rate) ? ?Leone Haven, PharmD ?

## 2021-09-03 NOTE — Plan of Care (Signed)
  Problem: Education: Goal: Knowledge of General Education information will improve Description: Including pain rating scale, medication(s)/side effects and non-pharmacologic comfort measures Outcome: Progressing   Problem: Activity: Goal: Risk for activity intolerance will decrease Outcome: Progressing   Problem: Nutrition: Goal: Adequate nutrition will be maintained Outcome: Progressing   Problem: Coping: Goal: Level of anxiety will decrease Outcome: Progressing   

## 2021-09-03 NOTE — Care Management Important Message (Signed)
Important Message ? ?Patient Details IM Letter placed in Patients room. ?Name: Gary Maddox ?MRN: [id number] ?Date of Birth: [birth year] ? ? ?Medicare Important Message Given:  Yes ? ? ? ? ?Kerin Salen ?09/03/2021, 1:46 PM ?

## 2021-09-03 NOTE — Assessment & Plan Note (Addendum)
Alcoholic cirrhosis of the liver with ascites. ?Chronic hep C ?Presents with complaints of abdominal distention.  Work-up showed that the patient had heterogeneous mass in the right liver. ?AFP minimally elevated. ?GI and hematology both were consulted. ?Underwent MRI liver which is confirming the findings for hepatocellular carcinoma. ?Per oncology with his performance status patient's prognosis is very poor limited to at max a few months. ?Palliative care and hospice recommended which the patient agrees. ?We will follow-up on recommendation of palliative care. ?No recommended for therapy for chronic hepatitis C given his high viral load count as well as new diagnosis of carcinoma. ?Currently on diuretic therapy for volume overload in the setting of cirrhosis. ?

## 2021-09-03 NOTE — Progress Notes (Signed)
ANTICOAGULATION CONSULT NOTE  ? ?Pharmacy Consult for heparin ?Indication: DVT ? ?No Known Allergies ? ?Patient Measurements: ?Height: 6' (182.9 cm) ?Weight: 69.3 kg (152 lb 12.5 oz) ?IBW/kg (Calculated) : 77.6 ?Heparin Dosing Weight: 72.6 kg ? ?Vital Signs: ?Temp: 99.1 ?F (37.3 ?C) (04/19 0175) ?Temp Source: Oral (04/19 0511) ?BP: 129/94 (04/19 0511) ?Pulse Rate: 76 (04/19 0511) ? ?Labs: ?Recent Labs  ?  09/01/21 ?0355 09/01/21 ?1058 09/02/21 ?0336 09/02/21 ?1105 09/02/21 ?1205 09/03/21 ?0412 09/03/21 ?0748  ?HGB 11.9*  --  12.0*  --   --  11.8*  --   ?HCT 35.2*  --  34.9*  --   --  35.2*  --   ?PLT 360  --  369  --   --  376  --   ?HEPARINUNFRC 0.71*   < > 0.53 0.45  --  0.20* 0.41  ?CREATININE  --   --   --   --  0.77 0.61  --   ? < > = values in this interval not displayed.  ? ? ? ?Estimated Creatinine Clearance: 86.6 mL/min (by C-G formula based on SCr of 0.61 mg/dL). ? ?Medications:  ?Scheduled:  ? amLODipine  5 mg Oral Daily  ? carvedilol  12.5 mg Oral BID  ? feeding supplement  237 mL Oral BID BM  ? folic acid  1 mg Oral Daily  ? furosemide  40 mg Oral Daily  ? hydrocortisone  1 application. Rectal BID  ? multivitamin with minerals  1 tablet Oral Daily  ? pantoprazole  40 mg Oral Daily  ? silver sulfADIAZINE  1 application. Topical Daily  ? sodium chloride  1 spray Each Nare Daily  ? spironolactone  100 mg Oral Daily  ? thiamine  100 mg Oral Daily  ? Or  ? thiamine  100 mg Intravenous Daily  ? ? ?Assessment: ?71 yo male presenting with abdominal distension, concern for liver mass on imaging.  Also presenting with BLE edema.  No anticoagulation PTA.  Dopplers positive for bilateral DVT's.  Patient is s/p paracentesis on 4/15 - discussed with IR and ok to begin heparin drip now for VTE treatment. ? ?Baseline labs: INR 1.1, PT 13.6, Hgb 12.7, Plts 319 ? ?Today, 09/03/21 ?- Heparin level = 0.41 (therapeutic) with heparin gtt @ 1000 units/hr  ?- CBC remains stable- Hg slightly low at 11.8, pltc WNL ?- No line or  bleeding issues noted  ? ?Goal of Therapy:  ?Heparin level 0.3-0.7 units/ml ?Monitor platelets by anticoagulation protocol: Yes ?  ?Plan:  ?Continue heparin infusion @ 1000 units/hr ?Daily heparin level, CBC, s/sx bleeding ?Plan to switch to DOAC once no further procedures planned  ? ? ?Royetta Asal, PharmD, BCPS ?09/03/2021 8:45 AM ? ? ? ? ?

## 2021-09-03 NOTE — Evaluation (Signed)
Occupational Therapy Evaluation ?Patient Details ?Name: Gary Maddox ?MRN: [id number] ?DOB: 11/25/1950 ?Today's Date: 09/03/2021 ? ? ?History of Present Illness Patient is 71 y.o. male presented to ED for abdominal distention with stomach swelling that has increased over the last 2 weeks. 4/16 s/p paracentesis 3.8L removed. He was started on heparin drip due to bilateral lower extremity DVT, abdominal CT findings of liver lesions with concern for [place]. PMH significant for ETOH abuse, HTN, DM2, untreated chronic hep C, GERD, HLD, back pain,  ? ?Clinical Impression ?  ?Gary Maddox is a 71 year old man who presents with generalized weakness and decreased activity tolerance resulting in a decline in functional abilities. Patient needing increased assistance for ADLs and min assist to power up. Patient exhibits self limiting behaviors - wanting an ensure, wanting to be washed up by staff - versus fully participating in therapy evaluation. Expect if patient had more motivation to participate in his self care he would require less assistance. Therapist recommends 24/7 assistance at home and [facility] services at discharge.  ?   ? ?Recommendations for follow up therapy are one component of a multi-disciplinary discharge planning process, led by the attending physician.  Recommendations may be updated based on patient status, additional functional criteria and insurance authorization.  ? ?Follow Up Recommendations ? Home health OT  ?  ?Assistance Recommended at Discharge Frequent or constant Supervision/Assistance  ?Patient can return home with the following A little help with walking and/or transfers;A lot of help with bathing/dressing/bathroom;Assistance with cooking/housework;Help with stairs or ramp for entrance;Assist for transportation ? ?  ?Functional Status Assessment ? Patient has had a recent decline in their functional status and demonstrates the ability to make significant improvements in function in a  reasonable and predictable amount of time.  ?Equipment Recommendations ? BSC/3in1  ?  ?Recommendations for Other Services   ? ? ?  ?Precautions / Restrictions Precautions ?Precautions: Fall ?Restrictions ?Weight Bearing Restrictions: No  ? ?  ? ?Mobility Bed Mobility ?Overal bed mobility: Needs Assistance ?Bed Mobility: Supine to Sit ?  ?  ?Supine to sit: Supervision ?  ?  ?  ?  ? ?Transfers ?Overall transfer level: Needs assistance ?Equipment used: Rolling walker (2 wheels) ?Transfers: Sit to/from Stand, Bed to chair/wheelchair/BSC ?Sit to Stand: Min assist, From elevated surface ?  ?  ?Step pivot transfers: Min guard ?  ?  ?General transfer comment: Min assist to power up and min guard to take steps to recliner. ?  ? ?  ?Balance Overall balance assessment: Needs assistance ?Sitting-balance support: No upper extremity supported, Feet supported ?Sitting balance-Leahy Scale: Good ?  ?  ?Standing balance support: Reliant on assistive device for balance, During functional activity ?Standing balance-Leahy Scale: Poor ?  ?  ?  ?  ?  ?  ?  ?  ?  ?  ?  ?  ?   ? ?ADL either performed or assessed with clinical judgement  ? ?ADL Overall ADL's : Needs assistance/impaired ?Eating/Feeding: Independent ?  ?Grooming: Set up;Sitting ?  ?Upper Body Bathing: Set up;Sitting ?  ?Lower Body Bathing: Moderate assistance;Sit to/from stand ?  ?Upper Body Dressing : Set up;Sitting ?  ?Lower Body Dressing: Moderate assistance;Sit to/from stand ?  ?Toilet Transfer: Minimal assistance;BSC/3in1;Rolling walker (2 wheels) ?  ?Toileting- Clothing Manipulation and Hygiene: Moderate assistance;Sit to/from stand ?  ?  ?  ?Functional mobility during ADLs: Minimal assistance;Rolling walker (2 wheels) ?   ? ? ? ?Vision   ?Vision Assessment?: No apparent visual  deficits  ?   ?Perception   ?  ?Praxis   ?  ? ?Pertinent Vitals/Pain Pain Assessment ?Pain Assessment: No/denies pain  ? ? ? ?Hand Dominance Right ?  ?Extremity/Trunk Assessment Upper Extremity  Assessment ?Upper Extremity Assessment: RUE deficits/detail;LUE deficits/detail ?RUE Deficits / Details: WFL ROM, 4/5 strength ?RUE Sensation: WNL ?RUE Coordination: WNL ?LUE Deficits / Details: WFL ROM, 4/5 strength ?LUE Sensation: WNL ?LUE Coordination: WNL ?  ?Lower Extremity Assessment ?Lower Extremity Assessment: Defer to PT evaluation ?  ?Cervical / Trunk Assessment ?Cervical / Trunk Assessment: Normal ?  ?Communication Communication ?Communication: No difficulties ?  ?Cognition Arousal/Alertness: Awake/alert ?Behavior During Therapy: [facility] for tasks assessed/performed ?Overall Cognitive Status: Within Functional Limits for tasks assessed ?  ?  ?  ?  ?  ?  ?  ?  ?  ?  ?  ?  ?  ?  ?  ?  ?  ?  ?  ?General Comments    ? ?  ?Exercises   ?  ?Shoulder Instructions    ? ? ?Home Living Family/patient expects to be discharged to:: Private residence ?Living Arrangements: Spouse/significant other ?Available Help at Discharge: Family;Available PRN/intermittently ?Type of Home: House ?Home Access: Stairs to enter ?Entrance Stairs-Number of Steps: 4 ?Entrance Stairs-Rails: None ?Home Layout: One level ?  ?  ?  ?  ?Bathroom Toilet: Standard ?Bathroom Accessibility: Yes ?  ?Home Equipment: Rollator (4 wheels);Cane - single point ?  ?  ?  ? ?  ?Prior Functioning/Environment Prior Level of Function : Independent/Modified Independent;Needs assist ?  ?  ?  ?  ?  ?  ?Mobility Comments: amb with rollator, needing some assist recently per wife ?ADLs Comments: assist with LB dressing, showers for a few mos ?  ? ?  ?  ?OT Problem List: Decreased strength;Decreased activity tolerance;Impaired balance (sitting and/or standing);Decreased knowledge of use of DME or AE;Decreased safety awareness ?  ?   ?OT Treatment/Interventions: Self-care/ADL training;Therapeutic exercise;DME and/or AE instruction;Therapeutic activities;Balance training;Patient/family education  ?  ?OT Goals(Current goals can be found in the care plan section) Acute Rehab  OT Goals ?Patient Stated Goal: walk to bathroom and be independent with toileting ?OT Goal Formulation: With patient ?Time For Goal Achievement: 09/17/21 ?Potential to Achieve Goals: Good  ?OT Frequency: Min 2X/week ?  ? ?Co-evaluation   ?  ?  ?  ?  ? ?  ?AM-PAC OT "6 Clicks" Daily Activity     ?Outcome Measure Help from another person eating meals?: None ?Help from another person taking care of personal grooming?: A Little ?Help from another person toileting, which includes using toliet, bedpan, or urinal?: A Lot ?Help from another person bathing (including washing, rinsing, drying)?: A Lot ?Help from another person to put on and taking off regular upper body clothing?: A Little ?Help from another person to put on and taking off regular lower body clothing?: A Lot ?6 Click Score: 16 ?  ?End of Session Equipment Utilized During Treatment: Rolling walker (2 wheels) ?Nurse Communication: Mobility status ? ?Activity Tolerance: Patient tolerated treatment well ?Patient left: in chair;with call bell/phone within reach;with chair alarm set ? ?OT Visit Diagnosis: Muscle weakness (generalized) (M62.81)  ?              ?Time: 1025-1039 ?OT Time Calculation (min): 14 min ?Charges:  OT General Charges ?$OT Visit: 1 Visit ?OT Evaluation ?$OT Eval Low Complexity: 1 Low ? ?Breydan Shillingburg, OTR/L ?Acute Care Rehab Services  ?Office [phone] ?Pager: [phone]  ? ?Gary Maddox ?09/03/2021, 11:29 AM ?

## 2021-09-03 NOTE — Plan of Care (Signed)

## 2021-09-03 NOTE — Assessment & Plan Note (Signed)
Continue PPI ?

## 2021-09-03 NOTE — Progress Notes (Signed)
[facility] Progress Note ? ?Gary Maddox 71 y.o. 09-04-1950 ? ?CC:  untreated hepatitis C, liver masses with concern for [place] ? ? ?Subjective: ?Patient seen and examined sitting in chair. He is doing well today. Denies abdominal pain, nausea, vomiting, melena and hematochezia. He denies any bowel movements today.  ? ?ROS : Review of Systems  ?Gastrointestinal:  Negative for abdominal pain, blood in stool, constipation, diarrhea, heartburn, melena, nausea and vomiting.  ?Genitourinary:  Negative for dysuria and hematuria.  ?Neurological:  Negative for dizziness and headaches.  ? ? ? ?Objective: ?Vital signs in last 24 hours: ?Vitals:  ? 09/03/21 0511 09/03/21 0944  ?BP: (!) 129/94 119/71  ?Pulse: 76 73  ?Resp: 18 17  ?Temp: 99.1 ?F (37.3 ?C) 98.3 ?F (36.8 ?C)  ?SpO2: 96% 97%  ? ? ?Physical Exam: ? ?General:  Alert, cooperative, no distress, appears stated age  ?Head:  Normocephalic, without obvious abnormality, atraumatic  ?Eyes:  Anicteric sclera, EOM's intact  ?Lungs:   Clear to auscultation bilaterally, respirations unlabored  ?Heart:  Regular rate and rhythm, S1, S2 normal  ?Abdomen:   Soft, distended, non-tender, shifting dullness, small umbilical hernia,. bowel sounds active all four quadrants,  no masses.   ?Extremities: Extremities normal, atraumatic, no  edema  ?Pulses: 2+ and symmetric  ? ? ?Lab Results: ?Recent Labs  ?  09/02/21 ?1205 09/03/21 ?0412  ?NA 131* 130*  ?K 3.6 3.5  ?CL 97* 95*  ?CO2 27 29  ?GLUCOSE 83 96  ?BUN 12 11  ?CREATININE 0.77 0.61  ?CALCIUM 7.9* 7.9*  ?MG  --  1.6*  ?PHOS  --  2.6  ? ?Recent Labs  ?  09/03/21 ?0412  ?AST 76*  ?ALT 41  ?ALKPHOS 165*  ?BILITOT 1.0  ?PROT 5.9*  ?ALBUMIN 2.1*  ? ?Recent Labs  ?  09/02/21 ?0336 09/03/21 ?0412  ?WBC 7.7 9.1  ?HGB 12.0* 11.8*  ?HCT 34.9* 35.2*  ?MCV 98.9 98.9  ?PLT 369 376  ? ?No results for input(s): LABPROT, INR in the last 72 hours. ? ? ? ?Assessment ?Cirrhosis  ?Hepatitis C ?Alcohol abuse ?  ?Large liver mass likely due to  hepatocellular carcinoma.  AFP elevated. Patient high risk for [facility] given cirrhosis secondary to hepatitis C and alcohol abuse.  Previous ultrasound in 2020 suggestive of small lesion, patient declined follow-up imaging at that time. ?  ?CT ab/pelivs w/ con ?IMPRESSION: ?1. Large heterogeneous mass in the right liver measuring up to 13 cm worrisome for primary hepatocellular carcinoma. ?2. Additional numerous smaller nodules throughout the liver may ?represent cirrhosis fifth regenerating nodules, but metastatic ?disease not excluded. ?3. Portal vein patency can not be confirmed on this study. Consider ?further evaluation with ultrasound. ?4. Large volume ascites. ?5. Cholelithiasis. ?6.  Aortic Atherosclerosis (ICD10-I70.0). ?  ?AFP: 25.2 ?  ?HCV quantitative: 12,400,000 ?HCV Ab: reactive  ?HCV RNA :pending  ?  ?Negative  Hep A, Hep B, HIV ? ? ?HGB 11.8(12) Platelets [id number]) ?AST 76 ALT 41  ?Alkphos 165 TBili 1.0 ?GFR >60  ?INR 08/29/2021 1.1  ?MELD-Na score: 7 at 08/31/2021  1:59 AM ?MELD score: 7 at 08/31/2021  1:59 AM ?Calculated from: ?Serum Creatinine: 0.53 mg/dL (Using min of 1 mg/dL) at 08/31/2021  1:59 AM ?Serum Sodium: 131 mmol/L at 08/31/2021  1:59 AM ?Total Bilirubin: 0.9 mg/dL (Using min of 1 mg/dL) at 08/31/2021  1:59 AM ?INR(ratio): 1.1 at 08/29/2021 10:52 PM ?Age: 42 years ? ?RUQ U/S 4/15 ?1. Known multiple liver masses, better assessed on recent ?  cross-sectional imaging, in a background of cirrhosis are concerning ?for hepatocellular carcinoma. Differential considerations include ?metastatic disease. ?2. Cirrhotic liver with large volume ascites. The portal vein ?appears patent with bidirectional flow. Findings are consistent with ?portal venous hypertension. ?3. Cholelithiasis. ? ?MRI Liver w/wo contrast ?1. Large mass centered in the RIGHT hepatic lobe, LR 5, surrounded ?by a nodular and infiltrative disease, invading the portal vein and ?associated with multifocal disease in the LEFT hepatic  lobe. ?Findings of hepatocellular carcinoma with tumor in vein and ?infiltrative/multifocal features. ?2. Bilateral iliac signal on coronal images suspicious for bony ?involvement. ?3. Multifocal disease in the LEFT hepatic lobe. ?4. Moderately large volume of ascites only slightly diminished ?following paracentesis. ?5. 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 ?6. RIGHT adrenal lesion suspicious for metastatic disease. ?7. Iron deposition in marrow spaces. ?  ?4/16 s/p paracentesis- -3.8L removed, no acute evidence of SBP; follow cultures, cytology with malignant cells, benign reactive mesothelial cells and chronic inflammatory cells.  ? ?Plan: ?- Continue thiamine, folic acid, multivitamin for alcohol withdrawal. ?- continue furosemide and spironolactone, ?- Continue lactulose for cirrhosis, titrate to 2-3 bowel movements daily.  ?- Liver biopsy may not be necessary given MRI findings consistent with [place].  Due to size of mass patient not eligible for liver transplant.  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 May consider outpatient endoscopy if patient does not wish to start palliative care. From oncology notes sounds as though prognosis is poor and patient is going to consider if he wishes to proceed with systemic therapy after discussion with family.  ?- Continue supportive care ?[facility] will follow. ? ?Charlott Rakes PA-C ?09/03/2021, 11:16 AM ? ?Contact #  [phone]  ?

## 2021-09-03 NOTE — Progress Notes (Signed)
?  Progress Note ?Patient: Gary Maddox YHC:[id number] DOB: [birth year] DOA: 08/29/2021  ?DOS: the patient was seen and examined on 09/03/2021 ? ?Brief hospital course: ?71 y.o. male with medical history significant of EtOH abuse, cognitive disorder, HTN, DM2, chronic hep C, GERD, HLD. Presenting with abdominal distention.  Found to have ascites as well as liver mass.  [facility] as well as hematology both consulted. ?4/16 s/p paracentesis- -3.8L removed,  ?4/18 MRI liver confirms hepatocellular carcinoma findings. ? ?Assessment and Plan: ?Hepatocellular carcinoma ([place]) ?Alcoholic cirrhosis of the liver with ascites. ?Chronic hep C ?Patient does have history of chronic hepatitis C.  Presents with complaints of abdominal distention.  Work-up showed that the patient had heterogeneous mass in the right liver. ?AFP minimally elevated. ?GI and hematology both were consulted. ?Underwent MRI liver which is confirming the findings for hepatocellular carcinoma. ?Per oncology with his performance status patient's prognosis is very poor limited to at max a few months. ?Palliative care and hospice recommended which the patient agrees. ?We will follow-up on recommendation of palliative care. ?No recommended for therapy for chronic hepatitis C given his high viral load count as well as new diagnosis of carcinoma. ?Currently on diuretic therapy for volume overload in the setting of cirrhosis. ? ?ABUSE, ALCOHOL, IN REMISSION ?Continue CIWA protocol.  Monitor.  No evidence of withdrawal. ? ?DVT, bilateral lower limbs ([place]) ?4/15 [place] BLE demosntrates DVT in both lower extremities. ?Started on heparin GTT. ?We will consider transition to Xarelto. ? ?HLD (hyperlipidemia) ?Currently statin on hold.    - hold statin d/t elevated LFTs ? ?Hyponatremia ?Stable, monitor ? ?Mild cognitive impairment ?Home medications restarted ? ?GERD ?Continue PPI. ? ?Essential hypertension ?Continue home medications ?S/p Paracentesis, BP stable ? ?Subjective: No  nausea no vomiting no chills.  Reports fatigue and tiredness.  Distention actually improving. ? ?Physical Exam: ?Vitals:  ? 09/02/21 1416 09/02/21 2045 09/03/21 0511 09/03/21 0944  ?BP: 121/81 123/79 (!) 129/94 119/71  ?Pulse: 62 77 76 73  ?Resp: '18 20 18 17  '$ ?Temp: 97.6 ?F (36.4 ?C) 99.3 ?F (37.4 ?C) 99.1 ?F (37.3 ?C) 98.3 ?F (36.8 ?C)  ?TempSrc: Oral Oral Oral Oral  ?SpO2: 100% 100% 96% 97%  ?Weight:      ?Height:      ? ?General: Appear in mild distress; no visible Abnormal Neck Mass Or lumps, Conjunctiva normal ?Cardiovascular: S1 and S2 Present, aortic systolic  Murmur, ?Respiratory: good respiratory effort, Bilateral Air entry present and CTA, no Crackles, no wheezes ?Abdomen: Bowel Sound present, distended,Non tender  ?Extremities: no Pedal edema ?Neurology: alert and oriented to time, place, and person ?Gait not checked due to patient safety concerns  ? ?Data Reviewed: ?I have Reviewed nursing notes, Vitals, and Lab results since pt's last encounter. Pertinent lab results CBC BMP ?I have ordered test including CBC BMP ?I have reviewed the last note from oncology,  ?I have discussed pt's care plan and test results with palliative care.  ? ?Family Communication: None at bedside ? ?Disposition: ?Status is: Inpatient ?Remains inpatient appropriate because: Ongoing issues with discussion with regards to goals of care, currently on IV heparin. ? ?Author: ?[name], MD ?09/03/2021 8:10 PM ? ?Please look on [email or web address] to find out who is on call. ?

## 2021-09-03 NOTE — Progress Notes (Signed)
Hematology oncology ?Subjective: Patient remains bedridden but able to answer questions.  Patient's wife at the bedside.  He is extremely somnolent. ?Today's Vitals  ? 09/03/21 0300 09/03/21 0511 09/03/21 0744 09/03/21 0944  ?BP:  (!) 129/94  119/71  ?Pulse:  76  73  ?Resp:  18  17  ?Temp:  99.1 ?F (37.3 ?C)  98.3 ?F (36.8 ?C)  ?TempSrc:  Oral  Oral  ?SpO2:  96%  97%  ?Weight:      ?Height:      ?PainSc: 7   0-No pain   ? ?Body mass index is 20.72 kg/m?Marland Kitchen ?Physical examination: ?Abdominal distention ?Somnolence ?Generalized weakness ? ? ?  Latest Ref Rng & Units 09/03/2021  ?  4:12 AM 09/02/2021  ?  3:36 AM 09/01/2021  ?  3:55 AM  ?CBC  ?WBC 4.0 - 10.5 K/uL 9.1   7.7   7.0    ?Hemoglobin 13.0 - 17.0 g/dL 11.8   12.0   11.9    ?Hematocrit 39.0 - 52.0 % 35.2   34.9   35.2    ?Platelets 150 - 400 K/uL 376   369   360    ? ? ?  Latest Ref Rng & Units 09/03/2021  ?  4:12 AM 09/02/2021  ? 12:05 PM 08/31/2021  ?  1:59 AM  ?CMP  ?Glucose 70 - 99 mg/dL 96   83   84    ?BUN 8 - 23 mg/dL '11   12   13    '$ ?Creatinine 0.61 - 1.24 mg/dL 0.61   0.77   0.53    ?Sodium 135 - 145 mmol/L 130   131   131    ?Potassium 3.5 - 5.1 mmol/L 3.5   3.6   3.9    ?Chloride 98 - 111 mmol/L 95   97   99    ?CO2 22 - 32 mmol/L '29   27   27    '$ ?Calcium 8.9 - 10.3 mg/dL 7.9   7.9   7.8    ?Total Protein 6.5 - 8.1 g/dL 5.9    5.7    ?Total Bilirubin 0.3 - 1.2 mg/dL 1.0    0.9    ?Alkaline Phos 38 - 126 U/L 165    164    ?AST 15 - 41 U/L 76    62    ?ALT 0 - 44 U/L 41    33    ? ?Assessment and plan: ?Hepatocellular carcinoma: Unresectable: Poor prognosis: I discussed with him about different treatment options that may not prolong his life.  After much discussion, patient does not want to go through any systemic treatments.  ?He and his wife both agree to hospice care. ?They would prefer inpatient hospice care. ?

## 2021-09-03 NOTE — TOC Progression Note (Signed)
Transition of Care (TOC) - Progression Note  ? ? ?Patient Details  ?Name: Gary Maddox ?MRN: [id number] ?Date of Birth: 1950/12/21 ? ?Transition of Care (TOC) CM/SW Contact  ?Purcell Mouton, RN ?Phone Number: ?09/03/2021, 3:20 PM ? ?Clinical Narrative:    ?Pt from home with his spouse. TOC will continue to follow.  ? ? ?Expected Discharge Plan: Home/Self Care ?Barriers to Discharge: No Barriers Identified ? ?Expected Discharge Plan and Services ?Expected Discharge Plan: Home/Self Care ?  ?  ?Post Acute Care Choice: Home Health, [facility] ?Living arrangements for the past 2 months: [place] ?                ?  ?  ?  ?  ?  ?  ?  ?  ?  ?  ? ? ?Social Determinants of Health (SDOH) Interventions ?  ? ?Readmission Risk Interventions ?   ? View : No data to display.  ?  ?  ?  ? ? ?

## 2021-09-04 DIAGNOSIS — K7031 Alcoholic cirrhosis of liver with ascites: Secondary | ICD-10-CM

## 2021-09-04 DIAGNOSIS — Z515 Encounter for palliative care: Secondary | ICD-10-CM

## 2021-09-04 DIAGNOSIS — Z7189 Other specified counseling: Secondary | ICD-10-CM

## 2021-09-04 DIAGNOSIS — C220 Liver cell carcinoma: Secondary | ICD-10-CM

## 2021-09-04 DIAGNOSIS — R188 Other ascites: Secondary | ICD-10-CM | POA: Diagnosis not present

## 2021-09-04 LAB — HEPARIN LEVEL (UNFRACTIONATED)
Heparin Unfractionated: 0.27 IU/mL — ABNORMAL LOW (ref 0.30–0.70)
Heparin Unfractionated: 0.37 IU/mL (ref 0.30–0.70)

## 2021-09-04 LAB — CBC
HCT: 34.1 % — ABNORMAL LOW (ref 39.0–52.0)
Hemoglobin: 11.5 g/dL — ABNORMAL LOW (ref 13.0–17.0)
MCH: 33.5 pg (ref 26.0–34.0)
MCHC: 33.7 g/dL (ref 30.0–36.0)
MCV: 99.4 fL (ref 80.0–100.0)
Platelets: 385 10*3/uL (ref 150–400)
RBC: 3.43 MIL/uL — ABNORMAL LOW (ref 4.22–5.81)
RDW: 13.1 % (ref 11.5–15.5)
WBC: 9.5 10*3/uL (ref 4.0–10.5)
nRBC: 0 % (ref 0.0–0.2)

## 2021-09-04 LAB — BASIC METABOLIC PANEL
Anion gap: 6 (ref 5–15)
BUN: 12 mg/dL (ref 8–23)
CO2: 28 mmol/L (ref 22–32)
Calcium: 7.8 mg/dL — ABNORMAL LOW (ref 8.9–10.3)
Chloride: 95 mmol/L — ABNORMAL LOW (ref 98–111)
Creatinine, Ser: 0.63 mg/dL (ref 0.61–1.24)
GFR, Estimated: >60 mL/min (ref >60)
Glucose, Bld: 94 mg/dL (ref 70–99)
Potassium: 3.7 mmol/L (ref 3.5–5.1)
Sodium: 129 mmol/L — ABNORMAL LOW (ref 135–145)

## 2021-09-04 LAB — MAGNESIUM: Magnesium: 1.7 mg/dL (ref 1.7–2.4)

## 2021-09-04 MED ORDER — MORPHINE SULFATE (PF) 2 MG/ML IV SOLN: 2.0000 mg | INTRAVENOUS | Status: DC | PRN

## 2021-09-04 MED ORDER — TRAMADOL HCL 50 MG PO TABS
50.0000 mg | ORAL_TABLET | Freq: Four times a day (QID) | ORAL | Status: DC | PRN
  Administered 2021-09-04 – 2021-09-07 (×5): 50 mg via ORAL
  Filled 2021-09-04 (×6): qty 1

## 2021-09-04 MED ORDER — APIXABAN 5 MG PO TABS
5.0000 mg | ORAL_TABLET | Freq: Two times a day (BID) | ORAL | Status: DC
Start: 2021-09-11 — End: 2021-09-04

## 2021-09-04 MED ORDER — APIXABAN 5 MG PO TABS
5.0000 mg | ORAL_TABLET | Freq: Two times a day (BID) | ORAL | Status: DC
  Administered 2021-09-04 – 2021-09-07 (×6): 5 mg via ORAL
  Filled 2021-09-04 (×6): qty 1

## 2021-09-04 MED ORDER — APIXABAN 5 MG PO TABS
10.0000 mg | ORAL_TABLET | Freq: Two times a day (BID) | ORAL | Status: DC
Start: 2021-09-04 — End: 2021-09-04
  Administered 2021-09-04: 10 mg via ORAL
  Filled 2021-09-04: qty 2

## 2021-09-04 NOTE — Assessment & Plan Note (Addendum)
Hemoglobin A1c 5.4.  In the past it was 6.4, 3 years ago. ?Not on medication right now. ?

## 2021-09-04 NOTE — Progress Notes (Signed)
?Progress Note ?Patient: Gary Maddox HGD:[id number] DOB: 1951-01-17 DOA: 08/29/2021  ?DOS: the patient was seen and examined on 09/04/2021 ? ?Brief hospital course: ?71 y.o. male with medical history significant of EtOH abuse, cognitive disorder, HTN, DM2, chronic hep C, GERD, HLD. Presenting with abdominal distention.  Found to have ascites as well as liver mass.  [facility] as well as hematology both consulted. ?4/16 s/p paracentesis- -3.8L removed,  ?4/18 MRI liver confirms hepatocellular carcinoma findings. ?4/19 palliative care consulted.  4/20 have discussion with family with regards to goals of care and recommendation for hospice from oncology. ? ?Assessment and Plan: ?Hepatocellular carcinoma ([place]) ?Alcoholic cirrhosis of the liver with ascites. ?Chronic hep C ?Presents with complaints of abdominal distention.  Work-up showed that the patient had heterogeneous mass in the right liver. ?AFP minimally elevated. ?GI and hematology both were consulted. ?Underwent MRI liver which is confirming the findings for hepatocellular carcinoma. ?Per oncology with his performance status patient's prognosis is very poor limited to at max a few months. ?Palliative care and hospice recommended which the patient agrees. ?We will follow-up on recommendation of palliative care. ?No recommended for therapy for chronic hepatitis C given his high viral load count as well as new diagnosis of carcinoma. ?Currently on diuretic therapy for volume overload in the setting of cirrhosis. ? ?Alcohol abuse, in remission ?Continue CIWA protocol.  Monitor.  No evidence of withdrawal. ? ?DVT, bilateral lower limbs ([place]) ?4/15 [place] BLE demosntrates DVT in both lower extremities. ?Started on heparin GTT. ?Currently switching to Eliquis as it is more usable in child pugh and B categories. ? ?Goals of care, counseling/discussion ?Oncology patient's prognosis is poor given the diagnosis of hepatocellular carcinoma in the setting of cirrhosis of the  liver as well as chronic hep C. ?Patient's performance status is poor. ?Patient is not a candidate for surgical resection. ?Not a good candidate for chemotherapy as well. ?Patient does not want to pursue chemotherapy as well. ?At present oncology is recommending transitioning to comfort and hospice. ?Discussed with patient, wife as well as sister-in-law.  They would like to transition to hospice although unable to provide care at home.  Palliative care consulted.  Will monitor recommendation. ? ?HLD (hyperlipidemia) ?Currently statin on hold.    - hold statin d/t elevated LFTs ? ?Moderate protein malnutrition ([place]) ?Placing the patient at high risk for poor outcome. ?Body mass index is 20.72 kg/m?Marland Kitchen ?Nutrition Problem: Increased nutrient needs ?Etiology: acute illness ?Nutrition Interventions: ?Interventions: Ensure Enlive (each supplement provides 350kcal and 20 grams of protein), MVI, Liberalize Diet  ? ?Hyponatremia ?Stable, monitor ? ?Mild cognitive impairment ?Home medications restarted ? ?Type 2 diabetes mellitus without complication, without long-term current use of insulin ([place]) ?Hemoglobin A1c 5.4.  In the past it was 6.4, 3 years ago. ?Not on medication right now. ? ?GERD ?Continue PPI. ? ?Essential hypertension ?Continue home medications ?S/p Paracentesis, BP stable ? ? ?Subjective: No pain no nausea no vomiting.  Breathing okay.  No fever no chills. ? ?Physical Exam: ?Vitals:  ? 09/03/21 2101 09/04/21 0528 09/04/21 0926 09/04/21 1441  ?BP: 125/63 114/79  117/74  ?Pulse: 73 62 63 64  ?Resp: '20 20  17  '$ ?Temp: 98.8 ?F (37.1 ?C) 99.3 ?F (37.4 ?C)  98.8 ?F (37.1 ?C)  ?TempSrc: Oral Oral  Oral  ?SpO2: 92% 97%  97%  ?Weight:      ?Height:      ? ?General: Appear in mild distress; no visible Abnormal Neck Mass Or lumps, Conjunctiva normal ?Cardiovascular:  S1 and S2 Present, no Murmur, ?Respiratory: good respiratory effort, Bilateral Air entry present and CTA, no Crackles, no wheezes ?Abdomen: Bowel Sound present,  Non tender ?Extremities: no Pedal edema ?Neurology: alert and oriented to time, place, and person ?Gait not checked due to patient safety concerns  ? ?Data Reviewed: ?I have Reviewed nursing notes, Vitals, and Lab results since pt's last encounter. Pertinent lab results CBC   ?Discussed with palliative care as well as oncology. ? ?Family Communication: Wife at bedside. ? ?Disposition: ?Status is: Inpatient ?Remains inpatient appropriate because: Ongoing issues with goals of care conversation. ? ?Author: ?[name], MD ?09/04/2021 7:42 PM ? ?Please look on [email or web address] to find out who is on call. ?

## 2021-09-04 NOTE — Assessment & Plan Note (Addendum)
Oncology patient's prognosis is poor given the diagnosis of hepatocellular carcinoma in the setting of cirrhosis of the liver as well as chronic hep C. ?Patient's performance status is poor. ?Patient is not a candidate for surgical resection. ?Not a good candidate for chemotherapy as well. ?Patient does not want to pursue chemotherapy as well. ?At present oncology is recommending transitioning to comfort and hospice. ?Discussed with patient, wife as well as sister-in-law.  They would like to transition to hospice although unable to provide care at home.  Palliative care consulted. ?Family currently desires to go home with hospice with [facility].  We will arrange for tomorrow. ?

## 2021-09-04 NOTE — Progress Notes (Signed)
ANTICOAGULATION CONSULT NOTE  ? ?Pharmacy Consult for heparin >> Eliquis ?Indication: DVT ? ?No Known Allergies ? ?Patient Measurements: ?Height: 6' (182.9 cm) ?Weight: 69.3 kg (152 lb 12.5 oz) ?IBW/kg (Calculated) : 77.6 ?Heparin Dosing Weight: 72.6 kg ? ?Vital Signs: ?Temp: 99.3 ?F (37.4 ?C) (04/20 0712) ?Temp Source: Oral (04/20 1975) ?BP: 114/79 (04/20 0528) ?Pulse Rate: 63 (04/20 0926) ? ?Labs: ?Recent Labs  ?  09/02/21 ?0336 09/02/21 ?1105 09/02/21 ?1205 09/03/21 ?0412 09/03/21 ?0748 09/04/21 ?0352 09/04/21 ?1048  ?HGB 12.0*  --   --  11.8*  --  11.5*  --   ?HCT 34.9*  --   --  35.2*  --  34.1*  --   ?PLT 369  --   --  376  --  385  --   ?HEPARINUNFRC 0.53   < >  --  0.20* 0.41 0.27* 0.37  ?CREATININE  --   --  0.77 0.61  --  0.63  --   ? < > = values in this interval not displayed.  ? ? ? ?Estimated Creatinine Clearance: 86.6 mL/min (by C-G formula based on SCr of 0.63 mg/dL). ? ?Medications:  ?Scheduled:  ? apixaban  10 mg Oral BID  ? Followed by  ? [START ON 09/11/2021] apixaban  5 mg Oral BID  ? carvedilol  12.5 mg Oral BID  ? feeding supplement  237 mL Oral BID BM  ? folic acid  1 mg Oral Daily  ? furosemide  40 mg Oral Daily  ? hydrocortisone  1 application. Rectal BID  ? multivitamin with minerals  1 tablet Oral Daily  ? pantoprazole  40 mg Oral Daily  ? silver sulfADIAZINE  1 application. Topical Daily  ? sodium chloride  1 spray Each Nare Daily  ? spironolactone  100 mg Oral Daily  ? thiamine  100 mg Oral Daily  ? Or  ? thiamine  100 mg Intravenous Daily  ? ? ?Assessment: ?71 yo male presenting with abdominal distension, concern for liver mass on imaging.  Also presenting with BLE edema.  No anticoagulation PTA.  Dopplers positive for bilateral DVT's.  Patient is s/p paracentesis on 4/15 - discussed with IR and ok to begin heparin drip now for VTE treatment. ? ?Baseline labs: INR 1.1, PT 13.6, Hgb 12.7, Plts 319 ? ?Today, 09/04/21 ?- Most recent heparin level therapuetic ?- CBC remains stable ?- No  line or bleeding issues per RN ?- Transition to Eliquis today per MD ? ?Goal of Therapy:  ?Heparin level 0.3-0.7 units/ml ?Monitor platelets by anticoagulation protocol: Yes ?  ?Plan:  ?Start Eliquis 5 mg PO BID; will skip loading phase as patient has already had > 5 days therapeutic heparin ?Stop heparin with first dose of Eliquis ?Pharmacy to provide  Eliquis counseling and coupons prior to discharge ? ?Reuel Boom, PharmD, BCPS ?[phone] ?09/04/2021, 1:23 PM ? ? ? ? ?

## 2021-09-04 NOTE — Progress Notes (Signed)
ANTICOAGULATION CONSULT NOTE  ? ?Pharmacy Consult for heparin ?Indication: DVT ? ?No Known Allergies ? ?Patient Measurements: ?Height: 6' (182.9 cm) ?Weight: 69.3 kg (152 lb 12.5 oz) ?IBW/kg (Calculated) : 77.6 ?Heparin Dosing Weight: 72.6 kg ? ?Vital Signs: ?Temp: 98.8 ?F (37.1 ?C) (04/19 2101) ?Temp Source: Oral (04/19 2101) ?BP: 125/63 (04/19 2101) ?Pulse Rate: 73 (04/19 2101) ? ?Labs: ?Recent Labs  ?  09/02/21 ?0336 09/02/21 ?1105 09/02/21 ?1205 09/03/21 ?0412 09/03/21 ?0748 09/04/21 ?0352  ?HGB 12.0*  --   --  11.8*  --  11.5*  ?HCT 34.9*  --   --  35.2*  --  34.1*  ?PLT 369  --   --  376  --  385  ?HEPARINUNFRC 0.53   < >  --  0.20* 0.41 0.27*  ?CREATININE  --   --  0.77 0.61  --  0.63  ? < > = values in this interval not displayed.  ? ? ? ?Estimated Creatinine Clearance: 86.6 mL/min (by C-G formula based on SCr of 0.63 mg/dL). ? ?Medications:  ?Scheduled:  ? amLODipine  5 mg Oral Daily  ? carvedilol  12.5 mg Oral BID  ? feeding supplement  237 mL Oral BID BM  ? folic acid  1 mg Oral Daily  ? furosemide  40 mg Oral Daily  ? hydrocortisone  1 application. Rectal BID  ? multivitamin with minerals  1 tablet Oral Daily  ? pantoprazole  40 mg Oral Daily  ? silver sulfADIAZINE  1 application. Topical Daily  ? sodium chloride  1 spray Each Nare Daily  ? spironolactone  100 mg Oral Daily  ? thiamine  100 mg Oral Daily  ? Or  ? thiamine  100 mg Intravenous Daily  ? ? ?Assessment: ?71 yo male presenting with abdominal distension, concern for liver mass on imaging.  Also presenting with BLE edema.  No anticoagulation PTA.  Dopplers positive for bilateral DVT's.  Patient is s/p paracentesis on 4/15 - discussed with IR and ok to begin heparin drip now for VTE treatment. ? ?Baseline labs: INR 1.1, PT 13.6, Hgb 12.7, Plts 319 ? ?Today, 09/04/21 ?- Heparin level = 0.27 sub-therapeutic with heparin gtt @ 1000 units/hr  ?- CBC remains stable- Hg slightly low at 11.5, pltc WNL ?- No line or bleeding issues per RN ? ?Goal of  Therapy:  ?Heparin level 0.3-0.7 units/ml ?Monitor platelets by anticoagulation protocol: Yes ?  ?Plan:  ?increase heparin infusion to 1150 units/hr ?Heparin level in 6 hours ?Daily heparin level, CBC, s/sx bleeding ?Plan to switch to DOAC once no further procedures planned  ? ? ?Dolly Rias RPh ?09/04/2021, 4:43 AM ? ? ? ? ?

## 2021-09-04 NOTE — Assessment & Plan Note (Signed)
Placing the patient at high risk for poor outcome. ?Body mass index is 20.72 kg/m?Marland Kitchen ?Nutrition Problem: Increased nutrient needs ?Etiology: acute illness ?Nutrition Interventions: ?Interventions: Ensure Enlive (each supplement provides 350kcal and 20 grams of protein), MVI, Liberalize Diet  ?

## 2021-09-05 NOTE — Progress Notes (Signed)
PT Cancellation Note ? ?Patient Details ?Name: Gary Maddox ?MRN: [id number] ?DOB: 1951-01-28 ? ? ?Cancelled Treatment:    Reason Eval/Treat Not Completed: Other (comment). Pt in bed and stated he did not want to get OOB. He stated he got OOB to the [facility] and wife states "that is all he does."  He denied pain or really fatigue for that matter, he just didn't want to get up.   Noted they are planning on hospice, but he was ok with PT checking back in the future. Will check back as schedule permits.  ? ? ?Galen Manila ?09/05/2021, 11:44 AM ?

## 2021-09-05 NOTE — Progress Notes (Signed)
?Progress Note ?Patient: Gary Maddox WUX:[id number] DOB: 1951/01/20 DOA: 08/29/2021  ?DOS: the patient was seen and examined on 09/05/2021 ? ?Brief hospital course: ?71 y.o. male with medical history significant of EtOH abuse, cognitive disorder, HTN, DM2, chronic hep C, GERD, HLD. Presenting with abdominal distention.  Found to have ascites as well as liver mass.  [facility] as well as hematology both consulted. ?4/16 s/p paracentesis- -3.8L removed,  ?4/18 MRI liver confirms hepatocellular carcinoma findings. ?4/19 palliative care consulted.  4/20 have discussion with family with regards to goals of care and recommendation for hospice from oncology. ?Currently awaiting further clarity on goals of care ? ?Assessment and Plan: ?Hepatocellular carcinoma ([place]) ?Alcoholic cirrhosis of the liver with ascites. ?Chronic hep C ?Presents with complaints of abdominal distention.  Work-up showed that the patient had heterogeneous mass in the right liver. ?AFP minimally elevated. ?GI and hematology both were consulted. ?Underwent MRI liver which is confirming the findings for hepatocellular carcinoma. ?Per oncology with his performance status patient's prognosis is very poor limited to at max a few months. ?Palliative care and hospice recommended which the patient agrees. ?We will follow-up on recommendation of palliative care. ?No recommended for therapy for chronic hepatitis C given his high viral load count as well as new diagnosis of carcinoma. ?Currently on diuretic therapy for volume overload in the setting of cirrhosis. ? ?Alcohol abuse, in remission ?Continue CIWA protocol.  Monitor.  No evidence of withdrawal. ? ?DVT, bilateral lower limbs ([place]) ?4/15 [place] BLE demosntrates DVT in both lower extremities. ?Started on heparin GTT. ?Currently switching to Eliquis as it is more usable in child pugh and B categories. ? ?Goals of care, counseling/discussion ?Oncology patient's prognosis is poor given the diagnosis of  hepatocellular carcinoma in the setting of cirrhosis of the liver as well as chronic hep C. ?Patient's performance status is poor. ?Patient is not a candidate for surgical resection. ?Not a good candidate for chemotherapy as well. ?Patient does not want to pursue chemotherapy as well. ?At present oncology is recommending transitioning to comfort and hospice. ?Discussed with patient, wife as well as sister-in-law.  They would like to transition to hospice although unable to provide care at home.  Palliative care consulted.  Will monitor recommendation. ? ?HLD (hyperlipidemia) ?Currently statin on hold.    - hold statin d/t elevated LFTs ? ?Moderate protein malnutrition ([place]) ?Placing the patient at high risk for poor outcome. ?Body mass index is 20.72 kg/m?Marland Kitchen ?Nutrition Problem: Increased nutrient needs ?Etiology: acute illness ?Nutrition Interventions: ?Interventions: Ensure Enlive (each supplement provides 350kcal and 20 grams of protein), MVI, Liberalize Diet  ? ?Hyponatremia ?Stable, monitor ? ?Mild cognitive impairment ?Home medications restarted ? ?Type 2 diabetes mellitus without complication, without long-term current use of insulin ([place]) ?Hemoglobin A1c 5.4.  In the past it was 6.4, 3 years ago. ?Not on medication right now. ? ?GERD ?Continue PPI. ? ?Essential hypertension ?Continue home medications ?S/p Paracentesis, BP stable ? ?Referral placed to [facility].  Will monitor recommendation. ? ?Subjective: No nausea no vomiting no fever no chills.  Patient would like to go home if possible. ? ?Physical Exam: ?Vitals:  ? 09/04/21 1441 09/04/21 1950 09/05/21 0516 09/05/21 1524  ?BP: 117/74 (!) 142/92 122/75 134/82  ?Pulse: 64 65 63 62  ?Resp: '17 20 18 16  '$ ?Temp: 98.8 ?F (37.1 ?C) 98.5 ?F (36.9 ?C) 98.3 ?F (36.8 ?C) 98.5 ?F (36.9 ?C)  ?TempSrc: Oral  Oral Oral  ?SpO2: 97% 97% 97% 98%  ?Weight:      ?  Height:      ? ?General: Appear in mild distress; no visible Abnormal Neck Mass Or lumps, Conjunctiva  normal ?Cardiovascular: S1 and S2 Present, no Murmur, ?Respiratory: good respiratory effort, Bilateral Air entry present and CTA, no Crackles, no wheezes ?Abdomen: Bowel Sound present, distended,Non tender  ?Extremities: no Pedal edema ?Neurology: alert and oriented to time, place, and person ?Gait not checked due to patient safety concerns  ? ?Data Reviewed: ?I have Reviewed nursing notes, Vitals, and Lab results since pt's last encounter. Pertinent lab results     ?Discussed with palliative care. ? ?Family Communication: No one at bedside. ? ?Disposition: ?Status is: Inpatient ?Remains inpatient appropriate because: Ongoing issues with goals of care and unsafe discharge. ? ?Author: ?[name], MD ?09/05/2021 7:41 PM ? ?Please look on [email or web address] to find out who is on call. ?

## 2021-09-05 NOTE — Progress Notes (Signed)
Nutrition Follow-up ? ?DOCUMENTATION CODES:  ? ?Not applicable ? ?INTERVENTION:  ?- liberalize diet from 2 gram Na to Regular (cleared by MD). ?- continue Ensure BID. ?- encourage family/visitors to bring in foods and beverages that patient enjoys. ? ? ?NUTRITION DIAGNOSIS:  ? ?Increased nutrient needs related to acute illness as evidenced by estimated needs. -ongoing ? ?GOAL:  ? ?Patient will meet greater than or equal to 90% of their needs -unmet ? ?MONITOR:  ? ?PO intake, Supplement acceptance, Diet advancement, Labs, Weight trends ? ? ?ASSESSMENT:  ? ?Pt admitted with abdominal distension secondary to ascites. PMH significant for EtOH abuse, cognitive disorder, HTN, T2DM, chronic hep C, GERD, and HLD. ? ?Patient sleeping at the time of RD visit with a male at bedside. She shares that patient did not want lunch and that appetite has been decreased and he has not been eating much. ? ?Shared plan for diet liberalization and encouraged for patient's favorite foods to be brought in, if he would like. ? ?Ensure order in for BID and he has been accepting this supplement 75% of the time offered. ? ?He underwent paracentesis on 4/15 was 3.8 L clear, yellow fluid removed. ? ?He has not been weighed since 4/15. Moderate pitting edema to BLE documented in the edema section of flow sheet. ? ?Per notes: ?- hepatocellular carcinoma with alcoholic liver cirrhosis with ascites and chronic hepatitis C--not a surgery or chemo candidate ?- BLE DVTs ?- moderate protein malnutrition ?- hyponatremia ? ? ?Labs reviewed; Na: 129 mmol/l, Ca: 7.8 mg/dl. ? ?Medications reviewed; 1 mg folvite/day, 40 mg oral lasix/day, 1 tablet multivitamin with minerals/day, 40 mg oral protonix/day, 100 mg aldactone/day, 100 mg oral thiamine/day. ?  ? ?NUTRITION - FOCUSED PHYSICAL EXAM: ? ?Deferred d/t patient sleeping and ongoing [place] decisions. ? ?Diet Order:   ?Diet Order   ? ?       ?  Diet regular Room service appropriate? Yes; Fluid consistency:  Thin  Diet effective now       ?  ? ?  ?  ? ?  ? ? ?EDUCATION NEEDS:  ? ?No education needs have been identified at this time ? ?Skin:  Skin Assessment: Reviewed RN Assessment ? ?Last BM:  4/21 (type 5, large amount) ? ?Height:  ? ?Ht Readings from Last 1 Encounters:  ?08/30/21 6' (1.829 m)  ? ? ?Weight:  ? ?Wt Readings from Last 1 Encounters:  ?08/30/21 69.3 kg  ? ? ?BMI:  Body mass index is 20.72 kg/m?. ? ?Estimated Nutritional Needs:  ?Kcal:  1900-2100 ?Protein:  95-110g ?Fluid:  >/=1.9L ? ? ? ? ?Jarome Matin, MS, RD, LDN ?Registered Dietitian II ?Inpatient Clinical Nutrition ?RD pager # and on-call/weekend pager # available in [place]  ? ?

## 2021-09-05 NOTE — Consult Note (Signed)
? ?                                                                                ?Consultation Note ?Date: 09/05/2021  ? ?Patient Name: Gary Maddox  ?DOB: 01-21-1951  MRN: [id number]  Age / Sex: 71 y.o., male  ?PCP: Mancel Bale, PA-C ?Referring Physician: Lavina Hamman, MD ? ?Reason for Consultation: Establishing goals of care ? ?HPI/Patient Profile: 71 y.o. male  with past medical history of alcohol abuse, cognitive disorder, hypertension, diabetes, chronic hep C, GERD, hyperlipidemia admitted on 08/29/2021 with abdominal distention.  He was found to have ascites as well as a liver mass.  Paracentesis removed 3.8 L.  He also had MRI that confirmed hepatocellular carcinoma.  He is evaluated by oncology and is not a candidate for disease modifying therapy.  Initial discussions had and family is agreeable to hospice.  Palliative asked to continue conversation and assisted determining best options in care venue for hospice services at time of discharge. ? ?Clinical Assessment and Goals of Care: ?Palliative care consult received.  Chart reviewed including personal review of pertinent labs and imaging. ? ?I met today with Mr. Mccue and his wife.  They report that the doctors have been doing a good job of explaining things to them. ? ?He understands that he has newly diagnosed cancer and is not a candidate for disease modifying therapy.  We discussed likely pathway of disease progression and continued changes in his nutrition, cognition, and functional status.  Discussed goal of maintaining these as much as possible and how to focus on aggressive symptom management can be beneficial when things such as chemotherapy are not likely to add time and quality to his life. ? ?We discussed services including home hospice, hospice at long-term care facility, and residential hospice.  At this point, his functional status is declining but I shared with them that I did not think he was at a point where he would be  approved for residential hospice services at this time. ? ?We discussed home hospice services and care that they provide.  His wife is concerned as she works and he will be left alone for large portion of day. ? ?Following discussion, I asked about further questions and support and they felt they just needed time to consider options. ? ?SUMMARY OF RECOMMENDATIONS   ?-Mr. Collier and his wife are understanding of his disease process and prognosis.  They are open to hospice services but are struggling with logistics of determining best care venue to meet his needs.  We will allow him time to process and plan to follow-up again tomorrow. ? ?Prognosis:  ?< 6 months if his disease follows its natural course and he should qualify for hospice services at the time of discharge if so desired. ? ?Discharge Planning: To Be Determined  ? ?  ? ?Primary Diagnoses: ?Present on Admission: ? Ascites ? Chronic hepatitis C virus infection ([place]) ? Alcohol abuse, in remission ? Essential hypertension ? GERD ? Hyponatremia ? Moderate protein malnutrition ([place]) ? Transaminitis ? HLD (hyperlipidemia) ? Mild cognitive impairment ? Macrocytic anemia ? DVT, bilateral lower limbs ([place]) ? Alcoholic cirrhosis of liver with ascites ([place]) ?  Hepatocellular carcinoma ([place]) ? ? ?I have reviewed the medical record, interviewed the patient and family, and examined the patient. The following aspects are pertinent. ? ?Past Medical History:  ?Diagnosis Date  ? Alcohol abuse   ? Back pain   ? Diabetes mellitus without complication ([place])   ? Elevated LDL cholesterol level 10/05/2016  ? Essential hypertension 12/21/2006  ? Qualifier: Diagnosis of  By: Leward Quan MD, Pamala Hurry    ? GERD 12/21/2006  ? Qualifier: Diagnosis of  By: Leward Quan MD, Pamala Hurry    ? HEPATITIS C, CHRONIC VIRAL, W/O HEPATIC COMA 12/24/2006  ? Qualifier: Diagnosis of  By: Leward Quan MD, Pamala Hurry    ? ?Social History  ? ?Socioeconomic History  ? Marital status: Married  ?  Spouse name: Not on  file  ? Number of children: 8  ? Years of education: Not on file  ? Highest education level: Not on file  ?Occupational History  ? Occupation: sports maintanence  ?Tobacco Use  ? Smoking status: Former  ?  Types: Cigarettes  ? Smokeless tobacco: Never  ? Tobacco comments:  ?  quit a long time ago about 25 years ago  ?Vaping Use  ? Vaping Use: Never used  ?Substance and Sexual Activity  ? Alcohol use: Yes  ?  Comment: 1/2 pint per week  ? Drug use: No  ? Sexual activity: Yes  ?Other Topics Concern  ? Not on file  ?Social History Narrative  ? Lives with wife  ? 2 children -   ? 94 - Grandchildren  ?   ? Seatbelt - 100%  ? Gun in home - no  ?   ? Highest level of education - culinary  trade school  ? 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 ?Social Determinants of Health  ? ?Financial Resource Strain: Not on file  ?Food Insecurity: Not on file  ?Transportation Needs: Not on file  ?Physical Activity: Not on file  ?Stress: Not on file  ?Social Connections: Not on file  ? ?Family History  ?Problem Relation Age of Onset  ? Hypertension Mother   ? Hypertension Father   ? ?Scheduled Meds: ? apixaban  5 mg Oral BID  ? carvedilol  12.5 mg Oral BID  ? feeding supplement  237 mL Oral BID BM  ? folic acid  1 mg Oral Daily  ? furosemide  40 mg Oral Daily  ? hydrocortisone  1 application. Rectal BID  ? multivitamin with minerals  1 tablet Oral Daily  ? pantoprazole  40 mg Oral Daily  ? silver sulfADIAZINE  1 application. Topical Daily  ? sodium chloride  1 spray Each Nare Daily  ? spironolactone  100 mg Oral Daily  ? thiamine  100 mg Oral Daily  ? Or  ? thiamine  100 mg Intravenous Daily  ? ?Continuous Infusions: ?PRN Meds:.lactulose, morphine injection, naphazoline-glycerin, polyethylene glycol, traMADol ?Medications Prior to Admission:  ?Prior to Admission medications   ?Medication Sig Start Date End Date Taking? Authorizing Provider  ?amLODipine (NORVASC) 5 MG tablet Take 5 mg by mouth daily. 06/17/21  Yes [provider]  ?carvedilol (COREG) 12.5 MG tablet TAKE 1 TABLET(12.5 MG) BY MOUTH TWICE DAILY WITH A MEAL ?Patient taking differently: Take 12.5 mg by mouth 2 (two) times daily. 01/04/18  Yes Weber, Sarah L, PA-C  ?diazepam (VALIUM) 5 MG tablet Take 5 mg by mouth daily as needed (for MRI). 12/20/20  Yes [provider]  ?furosemide (LASIX) 40 MG tablet Take 40  mg by mouth daily.   Yes [provider]  ?hydrocortisone (ANUSOL-HC) 2.5 % rectal cream Place 1 application rectally 2 (two) times daily. 03/28/21  Yes [provider]  ?silver sulfADIAZINE (SILVADENE) 1 % cream Apply 1 application. topically daily. Apply to skin graft on right foot 05/01/21  Yes [provider]  ?sodium chloride (OCEAN) 0.65 % SOLN nasal spray Place 1 spray into both nostrils daily.   Yes [provider]  ?tetrahydrozoline 0.05 % ophthalmic solution Place 2 drops into both eyes daily as needed (dry, red eys).   Yes [provider]  ?potassium chloride (KLOR-CON) 10 MEQ tablet Take 10 mEq by mouth 2 (two) times daily. ?Patient not taking: Reported on 08/30/2021    [provider]  ? ?No Known Allergies ?Review of Systems ?Currently denies complaints ? ?Physical Exam ?General: Lying in bed.  Weak and frail appearing.  No distress.   ?Heart: Regular rate and rhythm. No murmur appreciated. ?Lungs: Good air movement, clear ?Abdomen: Soft, nontender, somewhat distended, positive bowel sounds.   ?Ext: No significant edema ?Skin: Warm and dry ?Neuro: Grossly intact, nonfocal.  ? ?Vital Signs: BP 122/75 (BP Location: Left Arm)   Pulse 63   Temp 98.3 ?F (36.8 ?C) (Oral)   Resp 18   Ht 6' (1.829 m)   Wt 69.3 kg   SpO2 97%   BMI 20.72 kg/m?  ?Pain Scale: 0-10 ?POSS *See Group Information*: S-Acceptable,Sleep, easy to arouse ?Pain Score: 0-No pain ? ? ?SpO2: SpO2: 97 % ?O2 Device:SpO2: 97 % ?O2 Flow Rate: .  ? ?IO: Intake/output summary: No intake or output data in the 24 hours ending  09/05/21 0904 ? ?LBM: Last BM Date : 09/04/21 ?Baseline Weight: Weight: 72.6 kg ?Most recent weight: Weight: 69.3 kg     ?Palliative Assessment/Data: ? ? ?Flowsheet Rows   ? ?Flowsheet Row Most Recent Value  ?Intake Tab   ?R

## 2021-09-06 MED ORDER — FOLIC ACID 1 MG PO TABS
1.0000 mg | ORAL_TABLET | Freq: Every day | ORAL | Status: DC
  Administered 2021-09-07: 1 mg via ORAL
  Filled 2021-09-06: qty 1

## 2021-09-06 MED ORDER — THIAMINE HCL 100 MG PO TABS
100.0000 mg | ORAL_TABLET | Freq: Every day | ORAL | Status: DC
  Administered 2021-09-07: 100 mg via ORAL
  Filled 2021-09-06: qty 1

## 2021-09-06 MED ORDER — ADULT MULTIVITAMIN W/MINERALS CH
1.0000 | ORAL_TABLET | Freq: Every day | ORAL | Status: DC
  Administered 2021-09-07: 1 via ORAL
  Filled 2021-09-06: qty 1

## 2021-09-06 NOTE — Progress Notes (Signed)
Manufacturing engineer [facility]) Hospital Liaison Note ? ?Referral received for patient/family interest in hospice at home.  ? ?[facility] liaison reached out to patient's wife Arvin Collard to explain hospice services. She declined to elect services at this time due to needing a caregiver to be at home with the patient while she works.  ? ?Medical team updated. Please call with any questions or concerns. Thank you ? ? ?Roselee Nova, LCSW ?[facility] Liaison ?[phone] ?

## 2021-09-06 NOTE — Progress Notes (Signed)
? ?                                                                                                                                                     ?                                                   ?Daily Progress Note  ? ?Patient Name: Gary Maddox       Date: 09/06/2021 ?DOB: 1951-01-25  Age: 71 y.o. MRN#: [id number] ?Attending Physician: Lavina Hamman, MD ?Primary Care Physician: Mancel Bale, PA-C ?Admit Date: 08/29/2021 ? ?Reason for Consultation/Follow-up: Establishing goals of care ? ?Subjective: ?I met today with Gary Maddox and his wife. ? ?We reviewed conversation from yesterday regarding hospice options.  While he wants to be at home, the concern remains about him having enough assistance as his wife needs to work.  I discussed with him that hospice is a very good support for people who want to be home but it is not a source for caregiving for custodial care needs.  They would like to speak with someone from hospice agency to hear exactly how much help is provided in the home and what care plan looks like at home.  ? ?Length of Stay: 7 ? ?Current Medications: ?Scheduled Meds:  ? apixaban  5 mg Oral BID  ? carvedilol  12.5 mg Oral BID  ? feeding supplement  237 mL Oral BID BM  ? folic acid  1 mg Oral Daily  ? furosemide  40 mg Oral Daily  ? hydrocortisone  1 application. Rectal BID  ? multivitamin with minerals  1 tablet Oral Daily  ? pantoprazole  40 mg Oral Daily  ? silver sulfADIAZINE  1 application. Topical Daily  ? sodium chloride  1 spray Each Nare Daily  ? spironolactone  100 mg Oral Daily  ? thiamine  100 mg Oral Daily  ? Or  ? thiamine  100 mg Intravenous Daily  ? ? ?Continuous Infusions: ? ? ?PRN Meds: ?lactulose, morphine injection, naphazoline-glycerin, polyethylene glycol, traMADol ? ?Physical Exam         ?General: Lying in bed.  Weak and frail appearing.  No distress.   ?Heart: Regular rate and rhythm. No murmur appreciated. ?Lungs: Good air movement, clear ?Abdomen: Soft,  nontender, somewhat distended, positive bowel sounds.   ?Ext: No significant edema ?Skin: Warm and dry ?Neuro: Grossly intact, nonfocal.  ? ?Vital Signs: BP 119/68 (BP Location: Right Arm)   Pulse 66   Temp 98.3 ?F (36.8 ?C) (Oral)   Resp 16   Ht 6' (1.829 m)   Wt 69.3 kg   SpO2 97%   BMI  20.72 kg/m?  ?SpO2: SpO2: 97 % ?O2 Device: O2 Device: Room Air ?O2 Flow Rate:   ? ?Intake/output summary: No intake or output data in the 24 hours ending 09/06/21 0853 ?LBM: Last BM Date : 09/04/21 ?Baseline Weight: Weight: 72.6 kg ?Most recent weight: Weight: 69.3 kg ? ?     ?Palliative Assessment/Data: ? ? ? ?Flowsheet Rows   ? ?Flowsheet Row Most Recent Value  ?Intake Tab   ?Referral Department Hospitalist  ?Unit at Time of Referral Med/Surg Unit  ?Palliative Care Primary Diagnosis Cancer  ?Date Notified 09/02/21  ?Palliative Care Type New Palliative care  ?Reason for referral Clarify Goals of Care, Counsel Regarding Hospice  ?Date of Admission 09/02/21  ?Date first seen by Palliative Care 09/04/21  ?# of days Palliative referral response time 2 Day(s)  ?# of days IP prior to Palliative referral 0  ?Clinical Assessment   ?Palliative Performance Scale Score 40%  ?Psychosocial & Spiritual Assessment   ?Palliative Care Outcomes   ?Patient/Family meeting held? Yes  ?Who was at the meeting? Patient, wife  ? ?  ? ? ?Patient Active Problem List  ? Diagnosis Date Noted  ? Hepatocellular carcinoma ([place]) 09/03/2021  ? Alcoholic cirrhosis of liver with ascites ([place]) 08/31/2021  ? Ascites 08/30/2021  ? Chronic hepatitis C virus infection ([place]) 12/24/2006  ? Alcohol abuse, in remission 12/24/2006  ? DVT, bilateral lower limbs ([place]) 08/31/2021  ? Goals of care, counseling/discussion 09/04/2021  ? HLD (hyperlipidemia) 08/30/2021  ? Macrocytic anemia 08/30/2021  ? Hyponatremia 05/31/2021  ? Moderate protein malnutrition ([place]) 05/31/2021  ? Transaminitis 05/31/2021  ? Hypomagnesemia 05/31/2021  ? Radiculopathy 02/23/2018  ? Mild cognitive  impairment 06/25/2017  ? Chronic back pain 06/25/2017  ? Type 2 diabetes mellitus without complication, without long-term current use of insulin ([place]) 10/05/2016  ? Elevated LDL cholesterol level 10/05/2016  ? Essential hypertension 12/21/2006  ? GERD 12/21/2006  ? ? ?Palliative Care Assessment & Plan  ? ?Patient Profile: ?71 y.o. male  with past medical history of alcohol abuse, cognitive disorder, hypertension, diabetes, chronic hep C, GERD, hyperlipidemia admitted on 08/29/2021 with abdominal distention.  He was found to have ascites as well as a liver mass.  Paracentesis removed 3.8 L.  He also had MRI that confirmed hepatocellular carcinoma.  He is evaluated by oncology and is not a candidate for disease modifying therapy.  Initial discussions had and family is agreeable to hospice.  Palliative asked to continue conversation and assisted determining best options in care venue for hospice services at time of discharge. ? ?Recommendations/Plan: ?He and his wife like to discuss with [facility] collective regarding potential home hospice.  Consult placed to [facility] ? ?Code Status: ? ?  ?Code Status Orders  ?(From admission, onward)  ?  ? ? ?  ? ?  Start     Ordered  ? 08/30/21 0829  Full code  Continuous       ? 08/30/21 0831  ? ?  ?  ? ?  ? ?Code Status History   ? ? Date Active Date Inactive Code Status Order ID Comments User Context  ? 05/31/2021 1239 06/02/2021 1958 Full Code [id number]  Reubin Milan, MD ED  ? 02/23/2018 1552 02/24/2018 1638 Full Code [id number]  Justice Britain, PA-C Inpatient  ? ?  ? ? ?Prognosis: ? < 6 months ? ?Discharge Planning: ?To Be Determined ? ?Care plan was discussed with patient and wife ? ?Thank you for allowing the Palliative Medicine Team to assist  in the care of this patient. ? ?Micheline Rough, MD ? ?Please contact Palliative Medicine Team phone at [phone] for questions and concerns.  ? ? ? ? ? ?

## 2021-09-06 NOTE — Progress Notes (Signed)
?Progress Note ?Patient: Gary Maddox YQI:[id number] DOB: Jul 17, 1950 DOA: 08/29/2021  ?DOS: the patient was seen and examined on 09/06/2021 ? ?Brief hospital course: ?71 y.o. male with medical history significant of EtOH abuse, cognitive disorder, HTN, DM2, chronic hep C, GERD, HLD. Presenting with abdominal distention.  Found to have ascites as well as liver mass.  [facility] as well as hematology both consulted. ?4/16 s/p paracentesis- -3.8L removed,  ?4/18 MRI liver confirms hepatocellular carcinoma findings. ?4/19 palliative care consulted.  4/20 have discussion with family with regards to goals of care and recommendation for hospice from oncology. ?Plan is to go home with hospice tomorrow on 4/23. ? ?Assessment and Plan: ?Hepatocellular carcinoma ([place]) ?Alcoholic cirrhosis of the liver with ascites. ?Chronic hep C ?Presents with complaints of abdominal distention.  Work-up showed that the patient had heterogeneous mass in the right liver. ?AFP minimally elevated. ?GI and hematology both were consulted. ?Underwent MRI liver which is confirming the findings for hepatocellular carcinoma. ?Per oncology with his performance status patient's prognosis is very poor limited to at max a few months. ?Palliative care and hospice recommended which the patient agrees. ?We will follow-up on recommendation of palliative care. ?No recommended for therapy for chronic hepatitis C given his high viral load count as well as new diagnosis of carcinoma. ?Currently on diuretic therapy for volume overload in the setting of cirrhosis. ? ?Alcohol abuse, in remission ?Continue CIWA protocol.  Monitor.  No evidence of withdrawal. ? ?DVT, bilateral lower limbs ([place]) ?4/15 [place] BLE demosntrates DVT in both lower extremities. ?Started on heparin GTT. ?Currently switching to Eliquis as it is more usable in child pugh and B categories. ? ?Goals of care, counseling/discussion ?Oncology patient's prognosis is poor given the diagnosis of  hepatocellular carcinoma in the setting of cirrhosis of the liver as well as chronic hep C. ?Patient's performance status is poor. ?Patient is not a candidate for surgical resection. ?Not a good candidate for chemotherapy as well. ?Patient does not want to pursue chemotherapy as well. ?At present oncology is recommending transitioning to comfort and hospice. ?Discussed with patient, wife as well as sister-in-law.  They would like to transition to hospice although unable to provide care at home.  Palliative care consulted. ?Family currently desires to go home with hospice with [facility].  We will arrange for tomorrow. ? ?HLD (hyperlipidemia) ?Currently statin on hold.    - hold statin d/t elevated LFTs ? ?Moderate protein malnutrition ([place]) ?Placing the patient at high risk for poor outcome. ?Body mass index is 20.72 kg/m?Marland Kitchen ?Nutrition Problem: Increased nutrient needs ?Etiology: acute illness ?Nutrition Interventions: ?Interventions: Ensure Enlive (each supplement provides 350kcal and 20 grams of protein), MVI, Liberalize Diet  ? ?Hyponatremia ?Stable, monitor ? ?Mild cognitive impairment ?Home medications restarted ? ?Type 2 diabetes mellitus without complication, without long-term current use of insulin ([place]) ?Hemoglobin A1c 5.4.  In the past it was 6.4, 3 years ago. ?Not on medication right now. ? ?GERD ?Continue PPI. ? ?Essential hypertension ?Continue home medications ?S/p Paracentesis, BP stable ? ?Subjective: No pain no fever or chills.  Frustrated with unable to go home. ? ?Physical Exam: ?Vitals:  ? 09/05/21 1524 09/05/21 2121 09/06/21 0559 09/06/21 1500  ?BP: 134/82 120/80 119/68 119/75  ?Pulse: 62 69 66 61  ?Resp: '16 17 16 20  '$ ?Temp: 98.5 ?F (36.9 ?C) 98.6 ?F (37 ?C) 98.3 ?F (36.8 ?C) 98.6 ?F (37 ?C)  ?TempSrc: Oral Oral Oral Oral  ?SpO2: 98% 98% 97% 99%  ?Weight:      ?  Height:      ? ?General: Appear in mild distress; no visible Abnormal Neck Mass Or lumps, Conjunctiva normal ?Cardiovascular: S1 and S2  Present, no Murmur, ?Respiratory: good respiratory effort, Bilateral Air entry present and CTA, no Crackles, no wheezes ?Abdomen: Bowel Sound present, mildly distended, no tenderness ?Extremities: no Pedal edema ?Neurology: alert and oriented to time, place, and person ?Gait not checked due to patient safety concerns  ? ?Data Reviewed: ?Discussed with palliative care as well as family at bedside. ?Family Communication: Discussed with son on the phone as well as wife at bedside. ? ?Disposition: ?Status is: Inpatient ?Remains inpatient appropriate because: Ongoing issues with poor p.o. intake and goals of care conversation. ? ?Author: ?[name], MD ?09/06/2021 7:38 PM ? ?Please look on [email or web address] to find out who is on call. ?

## 2021-09-06 NOTE — TOC Progression Note (Addendum)
Transition of Care (TOC) - Progression Note  ? ? ?Patient Details  ?Name: Gary Maddox ?MRN: [id number] ?Date of Birth: 06/26/1950 ? ?Transition of Care (TOC) CM/SW Contact  ?Tawanna Cooler, RN ?Phone Number: ?09/06/2021, 9:16 AM ? ?Clinical Narrative:    ? ?Received a consult that patient/family would like to speak with a hospice rep regarding what home hospice can provide.  Called patient's room and no answer.  Called patient's spouse, Gary Maddox, and left voicemail message with CM contact info.  ? ?44 Addendum: Received call from wife.  She agrees to have [facility] call her at the same number. CM called [facility] rep and gave referral.   ? ?9458: Patient's wife called and asked if there was any other hospice company that she could speak to.  She did not have a preference.  CM called [facility] and left a voicemail message with the hospice rep.  ? ?1301: Spoke with Tim at [employer].  He has spoken with the family about home hospice.  [facility] can provide a CNA 5 days /week for 1 hour at a time, and an RN 2x/week. Family to stay with patient at other times.  If discharges home tomorrow, they can meet patient at home. Relayed information to team.  ? ?1541: Spoke with family and confirmed they have chosen [facility] and plan is to discharge with home hospice tomorrow.   ? ? ?

## 2021-09-07 MED ORDER — TRAMADOL HCL 50 MG PO TABS: 50.0000 mg | ORAL_TABLET | Freq: Four times a day (QID) | ORAL | 0 refills | Status: AC | PRN

## 2021-09-07 MED ORDER — SPIRONOLACTONE 100 MG PO TABS: 100.0000 mg | ORAL_TABLET | Freq: Every day | ORAL | 0 refills | Status: AC

## 2021-09-07 MED ORDER — PANTOPRAZOLE SODIUM 40 MG PO TBEC
40.0000 mg | DELAYED_RELEASE_TABLET | Freq: Every day | ORAL | 0 refills | Status: AC
Start: 2021-09-07 — End: ?

## 2021-09-07 MED ORDER — THIAMINE HCL 100 MG PO TABS
100.0000 mg | ORAL_TABLET | Freq: Every day | ORAL | 0 refills | Status: AC
Start: 2021-09-07 — End: ?

## 2021-09-07 MED ORDER — APIXABAN 5 MG PO TABS: 5.0000 mg | ORAL_TABLET | Freq: Two times a day (BID) | ORAL | 0 refills | Status: AC

## 2021-09-07 MED ORDER — LACTULOSE 10 GM/15ML PO SOLN: 10.0000 g | Freq: Every day | ORAL | 0 refills | Status: AC | PRN

## 2021-09-07 MED ORDER — LORAZEPAM 0.5 MG PO TABS: 0.5000 mg | ORAL_TABLET | Freq: Three times a day (TID) | ORAL | 0 refills | Status: AC | PRN

## 2021-09-07 NOTE — TOC Transition Note (Addendum)
Transition of Care (TOC) - CM/SW Discharge Note ? ? ?Patient Details  ?Name: Gary Maddox ?MRN: [id number] ?Date of Birth: Jun 09, 1950 ? ?Transition of Care (TOC) CM/SW Contact:  ?Tawanna Cooler, RN ?Phone Number: ?09/07/2021, 9:28 AM ? ? ?Clinical Narrative:    ? ?Patient going home with [facility].  Called hospice rep Tim to confirm everything in place. Called patient's wife to confirm home address and to make sure no changes.  Called EMS at [phone].  Transportation form and facesheet on chart.  RN aware of transport.  Physician aware of transport. Patient remains a full code, no DNR. ? ? ? ?Final next level of care: [place] ?Barriers to Discharge: Barriers Resolved ? ? ?Patient Goals and CMS Choice ?Patient states their goals for this hospitalization and ongoing recovery are:: go home ?CMS Medicare.gov Compare Post Acute Care list provided to:: Patient ?Choice offered to / list presented to : Spouse ? ?Discharge Placement ?  ?Name of family member notified: Spouse ?Patient and family notified of of transfer: 09/07/21 ? ?Discharge Plan and Services ?  ?  ?Post Acute Care Choice: Home Health, [place]          ?  ?DME Agency: NA ?  ?  ?   ?[place] Agency: Other - See comment [facility]) ?Date HH Agency Contacted: 09/07/21 ?Time HH Agency Contacted: 0900 ?Representative spoke with at [place]: Tim ? ? ? ? ? ? ? ?

## 2021-09-07 NOTE — Discharge Summary (Signed)
?Physician Discharge Summary ?  ?Patient: Gary Maddox MRN: [id number] DOB: 01/13/1951  ?Admit date:     08/29/2021  ?Discharge date: 09/07/2021  ?Discharge Physician: Gary Maddox  ?PCP: Gary Bale, PA-C ? ?Recommendations at discharge: ?Establish care with hospice Gary Maddox on discharge. ?Continue to engage with the family with regards to goals of care and the patient CODE STATUS remains full code on discharge despite being on hospice. ? ?Discharge Diagnoses: ?Principal Problem: ?  Ascites ?Active Problems: ?  Alcoholic cirrhosis of liver with ascites (Gary Maddox) ?  Hepatocellular carcinoma (Gary Maddox) ?  Chronic hepatitis C virus infection (Gary Maddox) ?  Alcohol abuse, in remission ?  DVT, bilateral lower limbs (Gary Maddox) ?  Essential hypertension ?  GERD ?  Type 2 diabetes mellitus without complication, without long-term current use of insulin (Gary Maddox) ?  Mild cognitive impairment ?  Hyponatremia ?  Moderate protein malnutrition (Gary Maddox) ?  Transaminitis ?  HLD (hyperlipidemia) ?  Macrocytic anemia ?  Goals of care, counseling/discussion ? ? ?Hospital Course: ?71 y.o. male with medical history significant of EtOH abuse, cognitive disorder, HTN, DM2, chronic hep C, GERD, HLD. Presenting with abdominal distention.  Found to have ascites as well as liver mass.  [facility] as well as hematology both consulted. ?4/16 s/p paracentesis- -3.8L removed,  ?4/18 MRI liver confirms hepatocellular carcinoma findings. ?4/19 palliative care consulted.  4/20 have discussion with family with regards to goals of care and recommendation for hospice from oncology. ? ?Assessment and Plan: ?Hepatocellular carcinoma ([place]) ?Alcoholic cirrhosis of the liver with ascites. ?Chronic hep C ?Presents with complaints of abdominal distention.   ?Work-up showed that the patient had heterogeneous mass in the right liver. ?AFP minimally elevated. ?GI and hematology both were consulted. ?Underwent MRI liver which is confirming the findings for hepatocellular carcinoma. ?Per  oncology with his performance status patient's prognosis is very poor limited to at max a few months. ?Palliative care and hospice recommended which the patient agrees. ?GI recommended no indication for therapy for chronic hepatitis C given his high viral load count as well as Gary diagnosis of carcinoma. ?Currently on diuretic therapy for volume overload in the setting of cirrhosis. ?Patient and family agree to go home with hospice with Gary Maddox agency.  Prescription for tramadol and Ativan were provided. ?Patient may require repeat paracentesis in 1 week which is currently ordered. ? ?Alcohol abuse, in remission ?No evidence of withdrawal. ? ?DVT, bilateral lower limbs (Gary Maddox) ?4/15 [place] BLE demosntrates DVT in both lower extremities. ?Started on heparin GTT. ?Currently switching to Eliquis as it is more usable in child pugh and B categories. ? ?Goals of care, counseling/discussion ?Oncology patient's prognosis is poor given the diagnosis of hepatocellular carcinoma in the setting of cirrhosis of the liver as well as chronic hep C. ?Patient's performance status is poor. ?Patient is not a candidate for surgical resection. ?Not a good candidate for chemotherapy as well. ?Patient does not want to pursue chemotherapy as well. ?At present oncology is recommending transitioning to comfort and hospice. ?Discussed with patient, wife as well as sister-in-law.  They would like to transition to hospice although unable to provide care at home.  Palliative care consulted. ?Family currently desires to go home with hospice with Gary Maddox.   ?Remains full code for now. ? ?HLD (hyperlipidemia) ?Currently statin on hold.     ? ?Moderate protein malnutrition (Gary Maddox) ?Placing the patient at high risk for poor outcome. ?Body mass index is 20.72 kg/m?Marland Kitchen  ?Nutrition Problem: Increased nutrient needs ?Etiology: acute illness ?  Nutrition Interventions: ?Interventions: Ensure Enlive (each supplement provides 350kcal and 20 grams of protein), MVI,  Liberalize Diet  ? ?Hyponatremia ?Stable, monitor ? ?Mild cognitive impairment ?Home medications restarted ? ?Type 2 diabetes mellitus without complication, without long-term current use of insulin (Gary Maddox) ?Hemoglobin A1c 5.4.  In the past it was 6.4, 3 years ago. ?Not on medication right now. ? ?GERD ?Continue PPI. ? ?Essential hypertension ?Continue home medications ?S/p Paracentesis, BP stable ? ?Pain control - Gary Maddox Controlled Substance Reporting System database was reviewed. and patient was instructed, not to drive, operate heavy machinery, perform activities at heights, swimming or participation in water activities or provide baby-sitting services while on Pain, Sleep and Anxiety Medications; until their outpatient Physician has advised to do so again. Also recommended to not to take more than prescribed Pain, Sleep and Anxiety Medications.  ? ?Consultants: Palliative care Oncology Gary Maddox Gary Maddox  ?Procedures performed:  ?Paracentesis ?DISCHARGE MEDICATION: ?Allergies as of 09/07/2021   ?No Known Allergies ?  ? ?  ?Medication List  ?  ? ?STOP taking these medications   ? ?amLODipine 5 MG tablet ?Commonly known as: NORVASC ?  ?diazepam 5 MG tablet ?Commonly known as: VALIUM ?  ?potassium chloride 10 MEQ tablet ?Commonly known as: KLOR-CON ?  ? ?  ? ?TAKE these medications   ? ?apixaban 5 MG Tabs tablet ?Commonly known as: ELIQUIS ?Take 1 tablet (5 mg total) by mouth 2 (two) times daily. ?  ?carvedilol 12.5 MG tablet ?Commonly known as: COREG ?TAKE 1 TABLET(12.5 MG) BY MOUTH TWICE DAILY WITH A MEAL ?What changed: See the Gary instructions. ?  ?furosemide 40 MG tablet ?Commonly known as: LASIX ?Take 40 mg by mouth daily. ?  ?hydrocortisone 2.5 % rectal cream ?Commonly known as: ANUSOL-HC ?Place 1 application rectally 2 (two) times daily. ?  ?lactulose 10 GM/15ML solution ?Commonly known as: [place] ?Take 15 mLs (10 g total) by mouth daily as needed for mild constipation (titrate to 2-3 BMs daily). ?   ?LORazepam 0.5 MG tablet ?Commonly known as: Ativan ?Take 1 tablet (0.5 mg total) by mouth every 8 (eight) hours as needed for anxiety. ?  ?pantoprazole 40 MG tablet ?Commonly known as: PROTONIX ?Take 1 tablet (40 mg total) by mouth daily. ?  ?silver sulfADIAZINE 1 % cream ?Commonly known as: SILVADENE ?Apply 1 application. topically daily. Apply to skin graft on right foot ?  ?sodium chloride 0.65 % Soln nasal spray ?Commonly known as: OCEAN ?Place 1 spray into both nostrils daily. ?  ?spironolactone 100 MG tablet ?Commonly known as: ALDACTONE ?Take 1 tablet (100 mg total) by mouth daily. ?  ?tetrahydrozoline 0.05 % ophthalmic solution ?Place 2 drops into both eyes daily as needed (dry, red eys). ?  ?thiamine 100 MG tablet ?Take 1 tablet (100 mg total) by mouth daily. ?  ?traMADol 50 MG tablet ?Commonly known as: ULTRAM ?Take 1 tablet (50 mg total) by mouth every 6 (six) hours as needed for moderate pain or severe pain. ?  ? ?  ? ? Follow-up Information   ? ? Weber, Damaris Hippo, PA-C .   ?Specialty: Physician Assistant ?Contact information: ?[address] ?[place] [address] ?[phone] ? ? ?  ?  ? ?  ?  ? ?  ? ?Disposition: Home with hospice ?Diet recommendation: Cardiac diet ? ?Discharge Exam: ?Vitals:  ? 09/06/21 0559 09/06/21 1500 09/06/21 2024 09/07/21 0451  ?BP: 119/68 119/75 (!) 139/93 126/80  ?Pulse: 66 61 83 63  ?Resp: 16 20  19 16  ?Temp: 98.3 ?F (36.8 ?C) 98.6 ?F (37 ?C) 97.9 ?F (36.6 ?C) 98.1 ?F (36.7 ?C)  ?TempSrc: Oral Oral Oral Oral  ?SpO2: 97% 99% 97% 97%  ?Weight:      ?Height:      ? ?General: Appear in mild distress; no visible Abnormal Neck Mass Or lumps, Conjunctiva normal ?Cardiovascular: S1 and S2 Present, no Murmur, ?Respiratory: good respiratory effort, Bilateral Air entry present and CTA, no Crackles, no wheezes ?Abdomen: Bowel Sound present, mildly distended, nontender ?Extremities: no Pedal edema ?Neurology: alert and oriented to place and person ?Gait not checked due to  patient safety concerns ?Filed Weights  ? 08/30/21 0300 08/30/21 0416 08/30/21 1937  ?Weight: 72.6 kg 72.6 kg 69.3 kg  ? ?Condition at discharge: stable ? ?The results of significant diagnostics from this

## 2021-09-07 NOTE — Progress Notes (Signed)
? ?                                                                                                                                                     ?                                                   ?Daily Progress Note  ? ?Patient Name: Gary Maddox       Date: 09/07/2021 ?DOB: 01/28/1951  Age: 71 y.o. MRN#: [id number] ?Attending Physician: Lavina Hamman, MD ?Primary Care Physician: Mancel Bale, PA-C ?Admit Date: 08/29/2021 ? ?Reason for Consultation/Follow-up: Establishing goals of care ? ?Subjective: ?I met today with Gary Maddox and his wife. ? ?Discussed plan to transition home with hospice services.  Attempted to discuss long-term goals including CODE STATUS prior to discharge but he reports, "I just want to focus on going home right now." ? ?Length of Stay: 8 ? ?Current Medications: ?Scheduled Meds:  ? apixaban  5 mg Oral BID  ? carvedilol  12.5 mg Oral BID  ? feeding supplement  237 mL Oral BID BM  ? folic acid  1 mg Oral Daily  ? furosemide  40 mg Oral Daily  ? hydrocortisone  1 application. Rectal BID  ? multivitamin with minerals  1 tablet Oral Daily  ? pantoprazole  40 mg Oral Daily  ? silver sulfADIAZINE  1 application. Topical Daily  ? sodium chloride  1 spray Each Nare Daily  ? spironolactone  100 mg Oral Daily  ? thiamine  100 mg Oral Daily  ? ? ?Continuous Infusions: ? ? ?PRN Meds: ?lactulose, morphine injection, naphazoline-glycerin, polyethylene glycol, traMADol ? ?Physical Exam         ?General: Lying in bed.  Weak and frail appearing.  No distress.   ?Heart: Regular rate and rhythm. No murmur appreciated. ?Lungs: Good air movement, clear ?Abdomen: Soft, nontender, somewhat distended, positive bowel sounds.   ?Ext: No significant edema ?Skin: Warm and dry ?Neuro: Grossly intact, nonfocal.  ? ?Vital Signs: BP 126/80 (BP Location: Left Arm)   Pulse 63   Temp 98.1 ?F (36.7 ?C) (Oral)   Resp 16   Ht 6' (1.829 m)   Wt 69.3 kg   SpO2 97%   BMI 20.72 kg/m?  ?SpO2: SpO2: 97 % ?O2  Device: O2 Device: Room Air ?O2 Flow Rate:   ? ?Intake/output summary:  ?Intake/Output Summary (Last 24 hours) at 09/07/2021 1040 ?Last data filed at 09/06/2021 1500 ?Gross per 24 hour  ?Intake 240 ml  ?Output --  ?Net 240 ml  ? ?LBM: Last BM Date : 09/06/21 ?Baseline Weight: Weight: 72.6 kg ?Most recent weight: Weight: 69.3 kg ? ?     ?  Palliative Assessment/Data: ? ? ? ?Flowsheet Rows   ? ?Flowsheet Row Most Recent Value  ?Intake Tab   ?Referral Department Hospitalist  ?Unit at Time of Referral Med/Surg Unit  ?Palliative Care Primary Diagnosis Cancer  ?Date Notified 09/02/21  ?Palliative Care Type New Palliative care  ?Reason for referral Clarify Goals of Care, Counsel Regarding Hospice  ?Date of Admission 09/02/21  ?Date first seen by Palliative Care 09/04/21  ?# of days Palliative referral response time 2 Day(s)  ?# of days IP prior to Palliative referral 0  ?Clinical Assessment   ?Palliative Performance Scale Score 40%  ?Psychosocial & Spiritual Assessment   ?Palliative Care Outcomes   ?Patient/Family meeting held? Yes  ?Who was at the meeting? Patient, wife  ? ?  ? ? ?Patient Active Problem List  ? Diagnosis Date Noted  ? Hepatocellular carcinoma ([place]) 09/03/2021  ? Alcoholic cirrhosis of liver with ascites ([place]) 08/31/2021  ? Ascites 08/30/2021  ? Chronic hepatitis C virus infection ([place]) 12/24/2006  ? Alcohol abuse, in remission 12/24/2006  ? DVT, bilateral lower limbs ([place]) 08/31/2021  ? Goals of care, counseling/discussion 09/04/2021  ? HLD (hyperlipidemia) 08/30/2021  ? Macrocytic anemia 08/30/2021  ? Hyponatremia 05/31/2021  ? Moderate protein malnutrition ([place]) 05/31/2021  ? Transaminitis 05/31/2021  ? Hypomagnesemia 05/31/2021  ? Radiculopathy 02/23/2018  ? Mild cognitive impairment 06/25/2017  ? Chronic back pain 06/25/2017  ? Type 2 diabetes mellitus without complication, without long-term current use of insulin ([place]) 10/05/2016  ? Elevated LDL cholesterol level 10/05/2016  ? Essential hypertension  12/21/2006  ? GERD 12/21/2006  ? ? ?Palliative Care Assessment & Plan  ? ?Patient Profile: ?71 y.o. male  with past medical history of alcohol abuse, cognitive disorder, hypertension, diabetes, chronic hep C, GERD, hyperlipidemia admitted on 08/29/2021 with abdominal distention.  He was found to have ascites as well as a liver mass.  Paracentesis removed 3.8 L.  He also had MRI that confirmed hepatocellular carcinoma.  He is evaluated by oncology and is not a candidate for disease modifying therapy.  Initial discussions had and family is agreeable to hospice.  Palliative asked to continue conversation and assisted determining best options in care venue for hospice services at time of discharge. ? ?Recommendations/Plan: ?Plan for home with hospice with [facility] tomorrow. ?He prefers to wait until he is home to readdress CODE STATUS.  This is certainly something hospice will be able to do once he is in his own home. ? ?Code Status: ? ?  ?Code Status Orders  ?(From admission, onward)  ?  ? ? ?  ? ?  Start     Ordered  ? 08/30/21 0829  Full code  Continuous       ? 08/30/21 0831  ? ?  ?  ? ?  ? ?Code Status History   ? ? Date Active Date Inactive Code Status Order ID Comments User Context  ? 05/31/2021 1239 06/02/2021 1958 Full Code [id number]  Reubin Milan, MD ED  ? 02/23/2018 1552 02/24/2018 1638 Full Code [id number]  Justice Britain, PA-C Inpatient  ? ?  ? ? ?Prognosis: ? < 6 months ? ?Discharge Planning: ?To Be Determined ? ?Care plan was discussed with patient and wife ? ?Thank you for allowing the Palliative Medicine Team to assist in the care of this patient. ? ?Micheline Rough, MD ? ?Please contact Palliative Medicine Team phone at [phone] for questions and concerns.  ? ? ? ? ? ?

## 2021-09-07 NOTE — Progress Notes (Signed)
AVS given to patient and explained at the bedside. Medications and follow up appointments have been explained with pt verbalizing understanding. EMS called for transport.  ? ?

## 2021-09-09 ENCOUNTER — Telehealth: Payer: Self-pay

## 2021-09-09 NOTE — Telephone Encounter (Signed)
I spoke with Gary Maddox and let her know that Gary Maddox's appt on 09/11/2021 with [name], NP and [name] has been cancelled as he went home with hospice services.  She verbalized understanding. ?

## 2021-09-11 ENCOUNTER — Ambulatory Visit: Payer: Medicare Other | Admitting: Nurse Practitioner

## 2022-01-16 DEATH — deceased

## 2024-01-11 IMAGING — CT CT ABD-PELV W/ CM
2 of 5 series · 15 of 46 positions shown, 17 images · IV contrast (agent unspecified)
Comparison: Ultrasound abdomen 06/27/2019.

CLINICAL DATA: Acute abdominal pain.  Ascites.

EXAM:
CT ABDOMEN AND PELVIS WITH CONTRAST
TECHNIQUE: Multidetector CT imaging of the abdomen and pelvis was performed
using the standard protocol following bolus administration of
intravenous contrast.

[Series 2: axial st · axial · 0.78mm/px · z∈[+1121,+1616]mm · 12 of 115 slices shown, 14 images]
[im 8/115  soft-tissue]
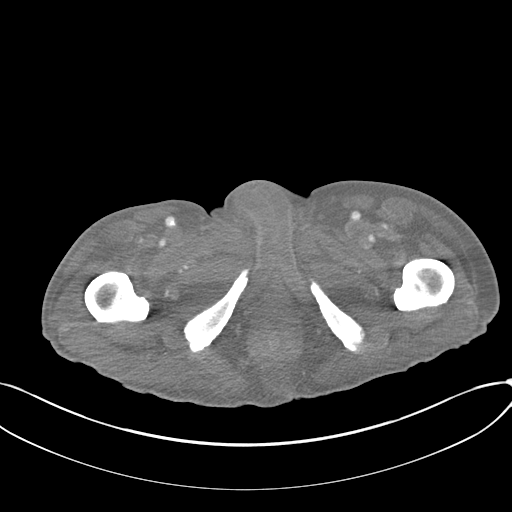
[im 8/115  bone]
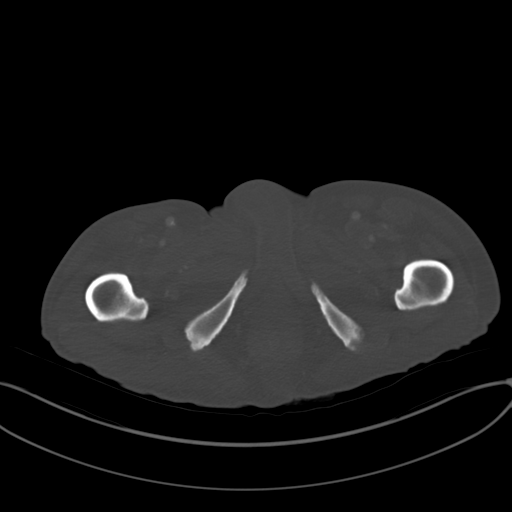
[im 16/115  soft-tissue]
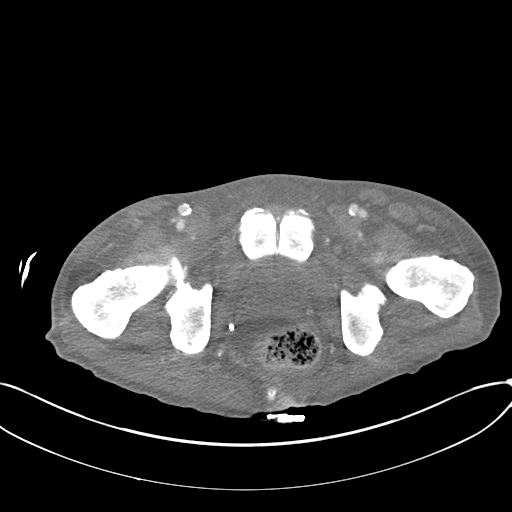
[im 23/115  soft-tissue]
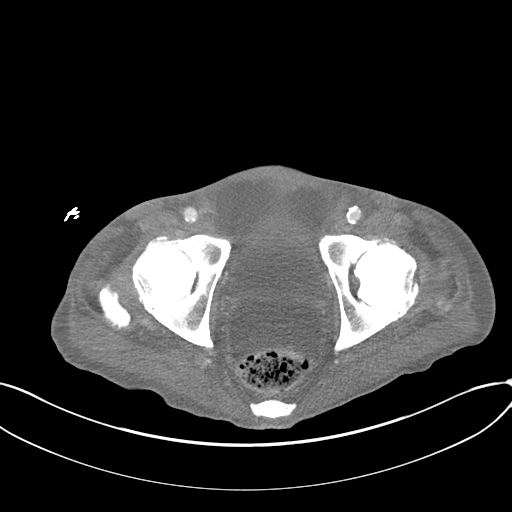
[im 39/115  soft-tissue]
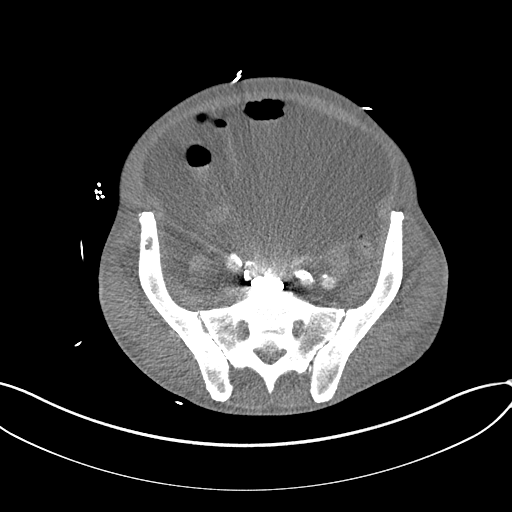
[im 46/115  soft-tissue]
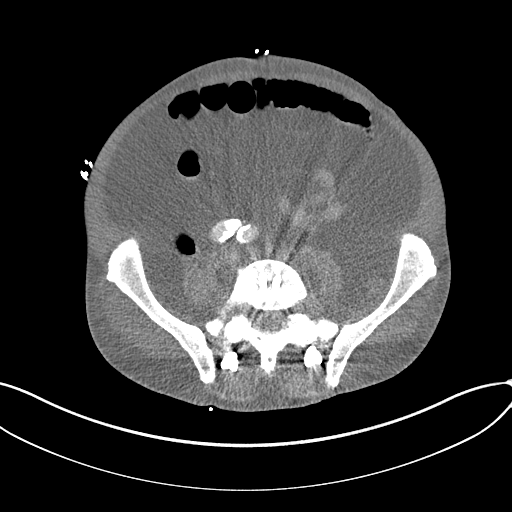
[im 54/115  soft-tissue]
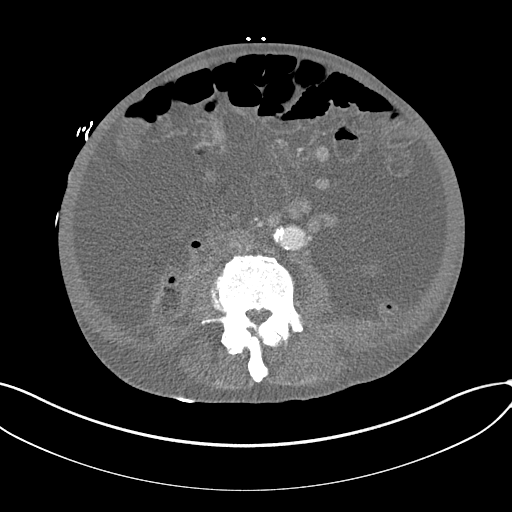
[im 61/115  soft-tissue]
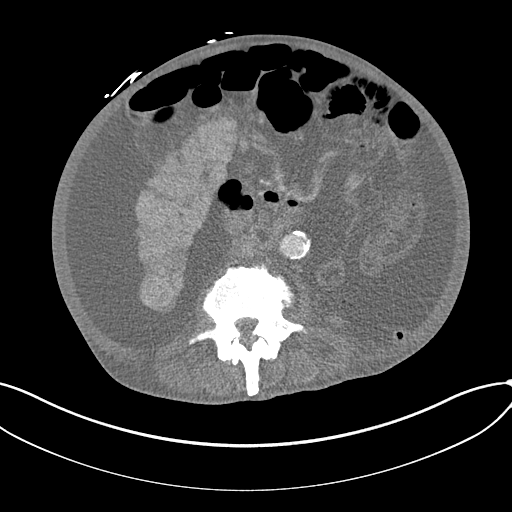
[im 69/115  soft-tissue]
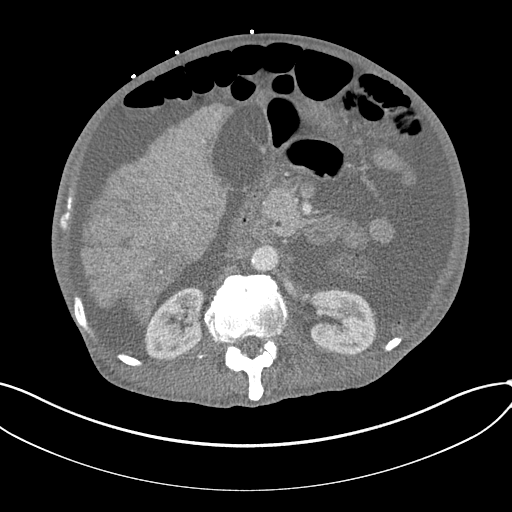
[im 77/115  soft-tissue]
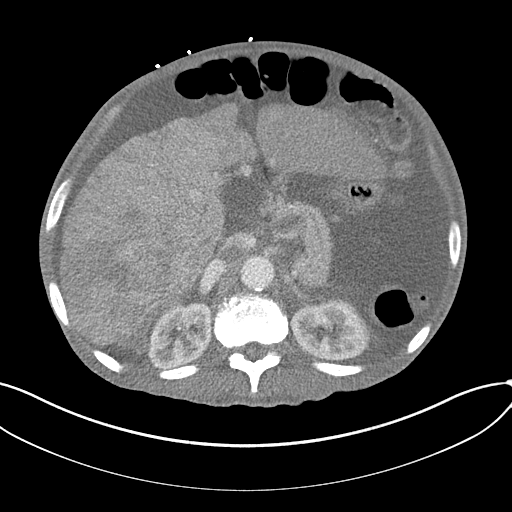
[im 77/115  bone]
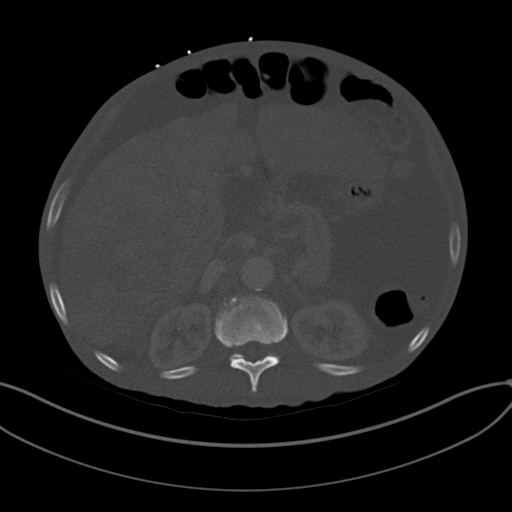
[im 92/115  soft-tissue]
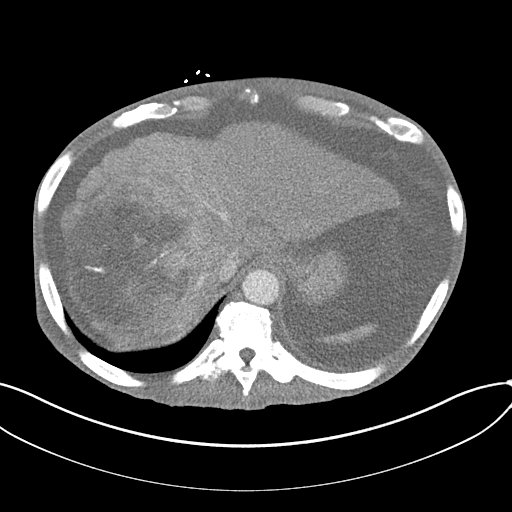
[im 99/115  soft-tissue]
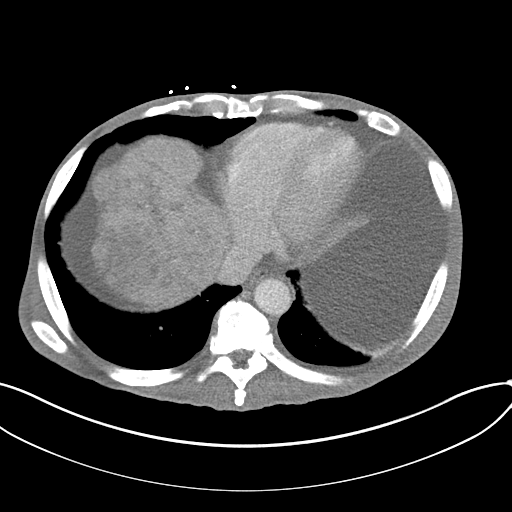
[im 107/115  soft-tissue]
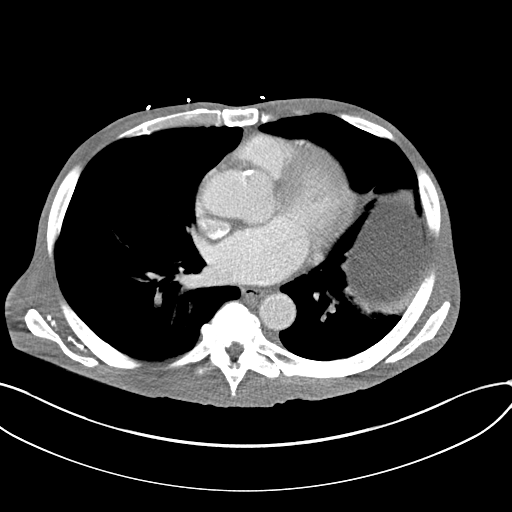

[Series 5: coronal st · coronal · 0.81mm/px · 3 of 151 slices shown]
[im 51/151  soft-tissue]
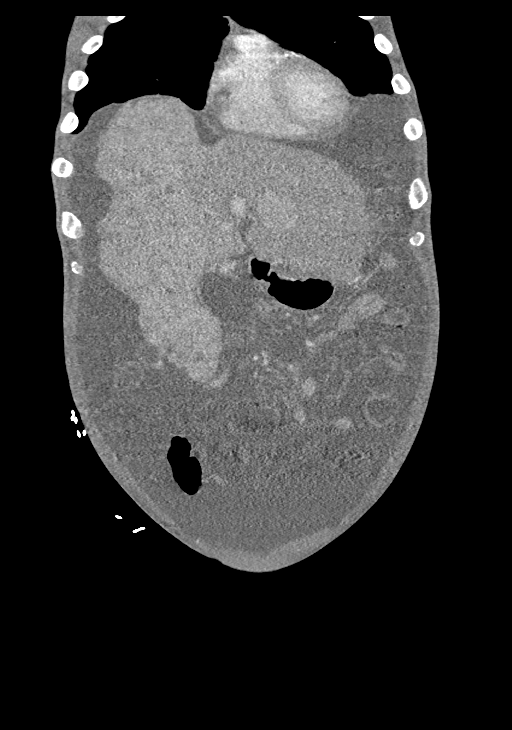
[im 67/151  soft-tissue]
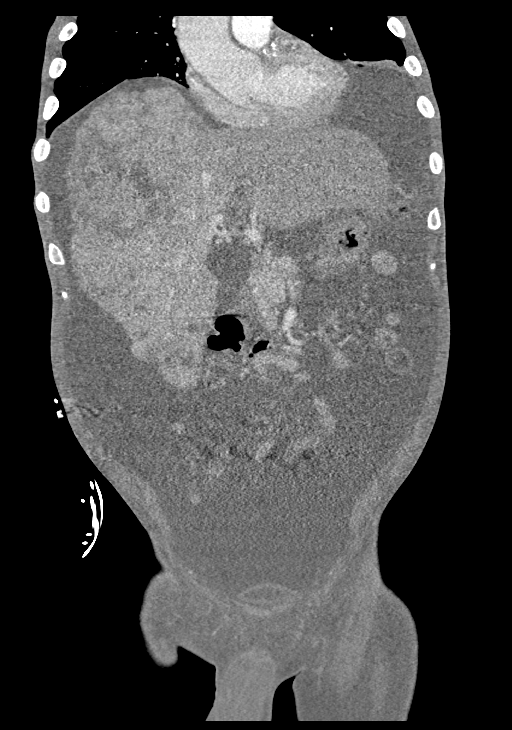
[im 84/151  soft-tissue]
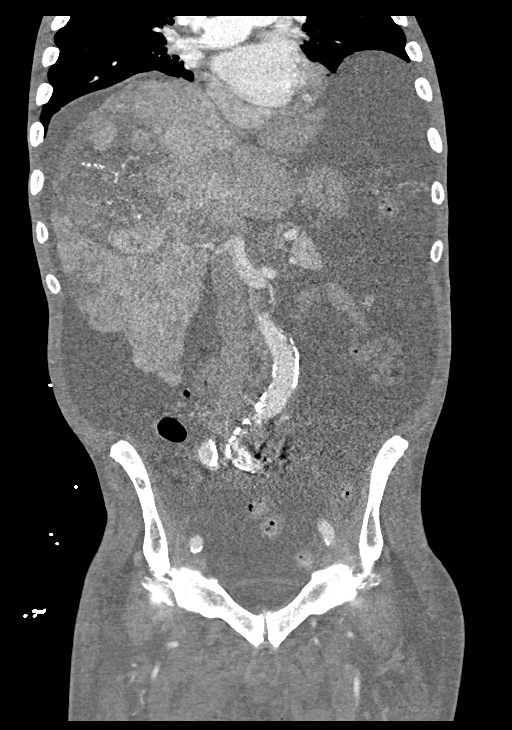

[15 of 46 positions shown; findings below may reference images not displayed]

RADIATION DOSE REDUCTION: This exam was performed according to the
departmental dose-optimization program which includes automated
exposure control, adjustment of the mA and/or kV according to
patient size and/or use of iterative reconstruction technique.

CONTRAST:  100mL OMNIPAQUE IOHEXOL 300 MG/ML  SOLN
FINDINGS: Lower chest: No acute abnormality.

Hepatobiliary: Liver is diffusely heterogeneous, enlarged, with
nodular liver contour. Ill-defined heterogeneous mass is seen in the
right liver with central areas of hypodensity measuring approximally
13 x 11 x 13 cm. There are additional ill-defined smaller nodular
densities throughout the right lobe of the liver. These measure up
to proximally 3.5 cm. Gallstones are present. There is no definite
biliary ductal dilatation.

Pancreas: Unremarkable. No pancreatic ductal dilatation or
surrounding inflammatory changes.

Spleen: Normal in size without focal abnormality.

Adrenals/Urinary Tract: There is an indeterminate left adrenal
nodule measuring 2.4 x 1.5 cm. The left adrenal gland is within
normal limits.

There is no hydronephrosis. There cyst in the superior pole the
right kidney measuring up to 11 mm. Bladder is within normal limits.

Stomach/Bowel: Stomach is within normal limits. Appendix appears
normal. No evidence of bowel wall thickening, distention, or
inflammatory changes.

Vascular/Lymphatic: Aortic atherosclerosis. No enlarged abdominal or
pelvic lymph nodes. Portal vein patency can not be confirmed on this
study.

Reproductive: Prostate is unremarkable.

Other: There is a very large amount of ascites throughout the
abdomen and pelvis. There is no free air or focal abdominal wall
hernia.

Musculoskeletal: L5-S1 posterior and anterior fusion hardware are
present. There are degenerative changes throughout the spine.
IMPRESSION: 1. Large heterogeneous mass in the right liver measuring up to 13 cm
worrisome for primary hepatocellular carcinoma.
2. Additional numerous smaller nodules throughout the liver may
represent cirrhosis fifth regenerating nodules, but metastatic
disease not excluded.
3. Portal vein patency can not be confirmed on this study. Consider
further evaluation with ultrasound.
4. Large volume ascites.
5. Cholelithiasis.
6.  Aortic Atherosclerosis ([id number]).

## 2024-01-12 IMAGING — US US PARACENTESIS
1 series · 4 of 4 positions shown · non-contrast
Comparison: none

INDICATION: History of ETOH abuse, hepatitis-C presented to ED complaining of
abdominal distention. Patient was found to have large volume ascites
with large liver mass. Request for diagnostic and therapeutic
paracentesis with a limit of 4 L.

[Series 1: us paracentesis · 4 of 4 slices shown]
[im 1/4]
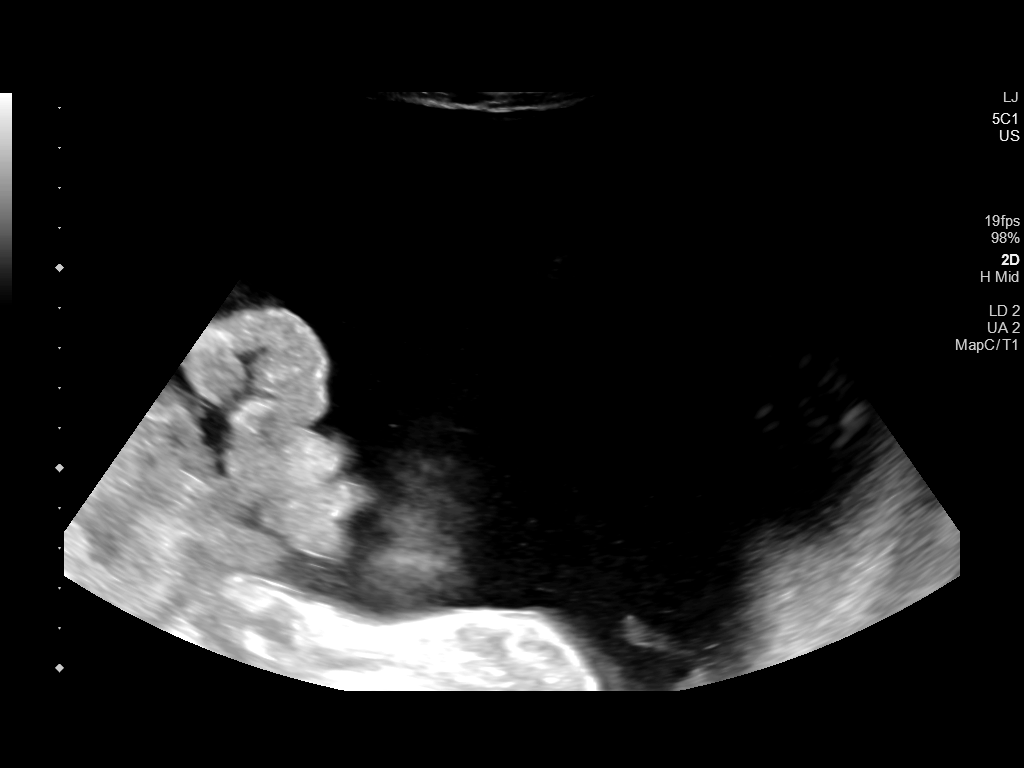
[im 2/4]
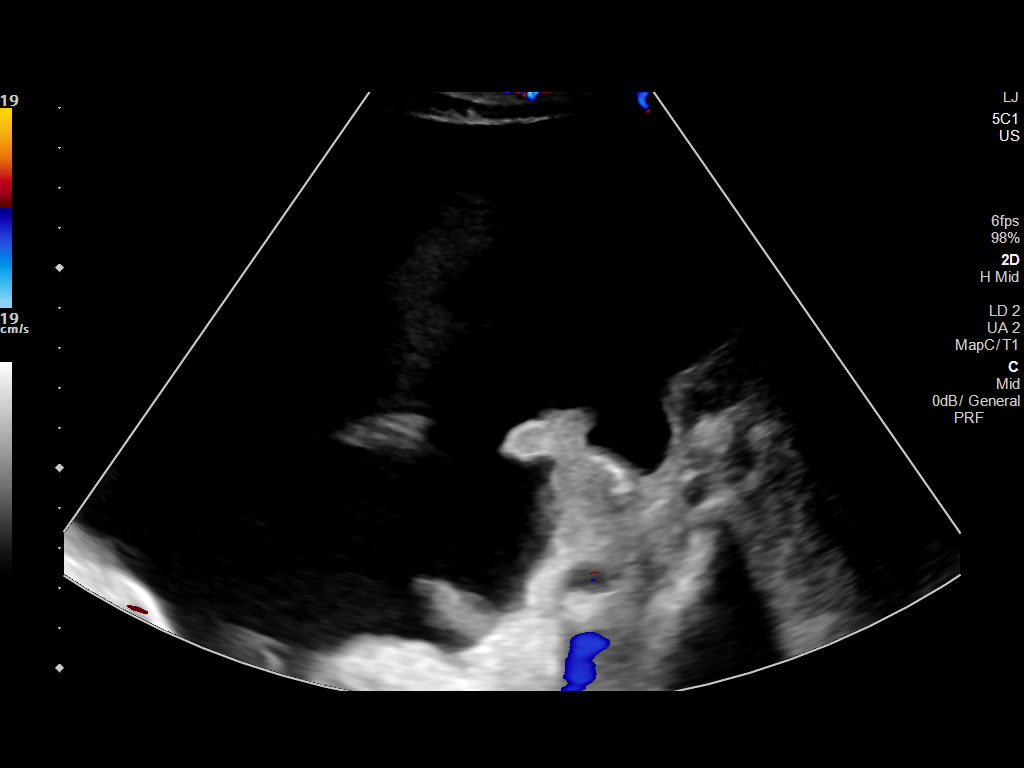
[im 3/4]
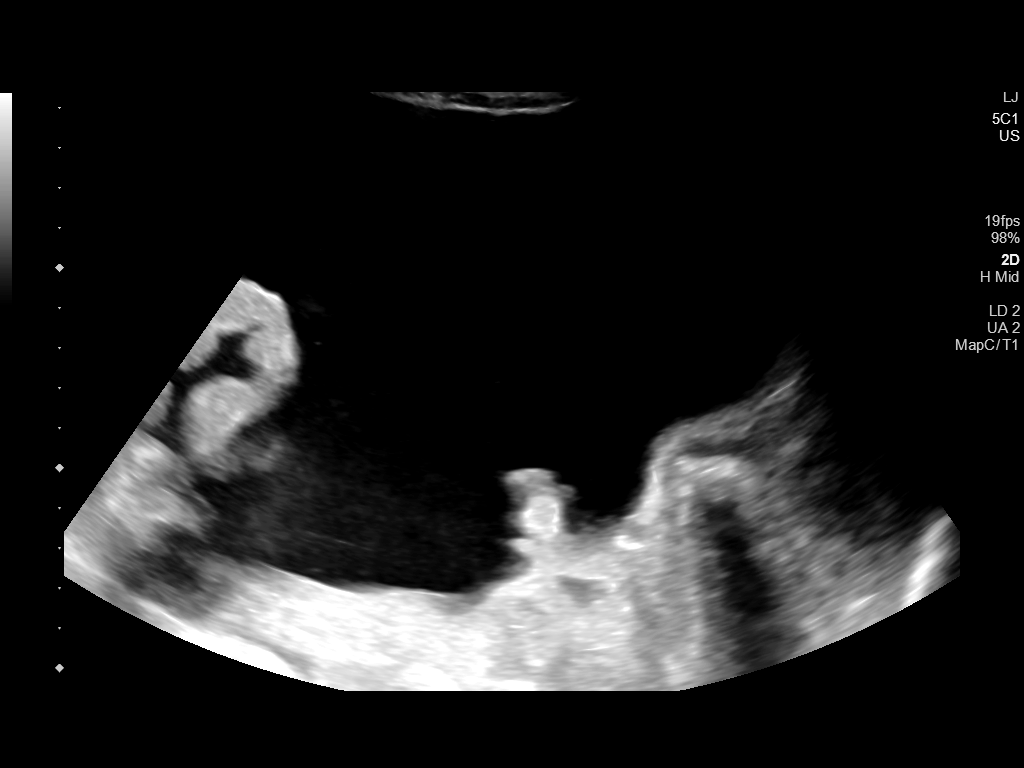
[im 4/4]
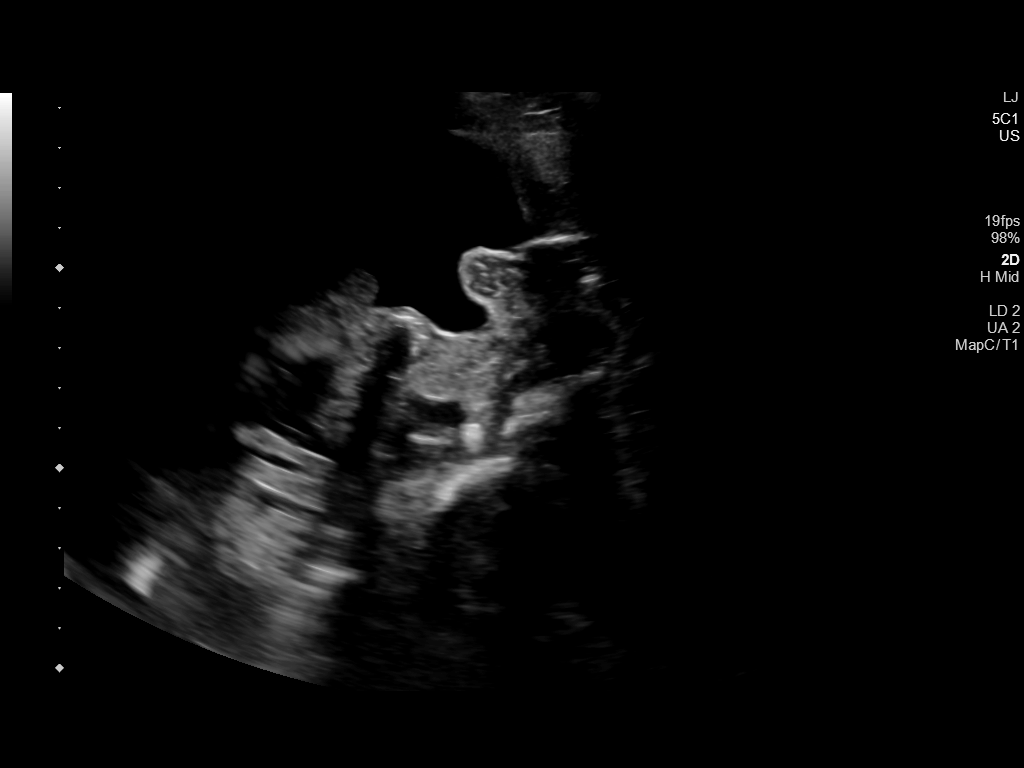

[4 of 4 positions shown; findings below may reference images not displayed]

EXAM:
ULTRASOUND GUIDED DIAGNOSTIC AND THERAPEUTIC LEFT LOWER QUADRANT
PARACENTESIS

MEDICATIONS:
10 mL 1 % lidocaine

COMPLICATIONS:
None immediate.

PROCEDURE:
Informed written consent was obtained from the patient after a
discussion of the risks, benefits and alternatives to treatment. A
timeout was performed prior to the initiation of the procedure.

Initial ultrasound scanning demonstrates a large amount of ascites
within the left lower abdominal quadrant. The left lower abdomen was
prepped and draped in the usual sterile fashion. 1% lidocaine was
used for local anesthesia.

Following this, a 19 gauge, 7-cm, Yueh catheter was introduced. An
ultrasound image was saved for documentation purposes. The
paracentesis was performed. The catheter was removed and a dressing
was applied. The patient tolerated the procedure well without
immediate post procedural complication.
Patient received post-procedure intravenous albumin; see nursing
notes for details.
FINDINGS: A total of approximately 3.8 L of clear, yellow fluid was removed.
Samples were sent to the laboratory as requested by the clinical
team.
IMPRESSION: Successful ultrasound-guided paracentesis yielding 3.8 liters of
peritoneal fluid.
